# Patient Record
Sex: Female | Born: 1950 | Race: White | Hispanic: No | Marital: Married
Health system: Southern US, Academic
[De-identification: ages and names within clinical notes are randomized; demographics above are authoritative.]

## PROBLEM LIST (undated history)

## (undated) ENCOUNTER — Encounter: Attending: Family | Primary: Family

## (undated) ENCOUNTER — Encounter

## (undated) ENCOUNTER — Ambulatory Visit

## (undated) ENCOUNTER — Ambulatory Visit: Payer: MEDICARE

## (undated) ENCOUNTER — Encounter: Attending: Hematology & Oncology | Primary: Hematology & Oncology

## (undated) ENCOUNTER — Encounter: Attending: Pharmacist | Primary: Pharmacist

## (undated) ENCOUNTER — Ambulatory Visit: Payer: MEDICARE | Attending: Adult Health | Primary: Adult Health

## (undated) ENCOUNTER — Telehealth: Attending: Hematology & Oncology | Primary: Hematology & Oncology

## (undated) ENCOUNTER — Encounter: Attending: Adult Health | Primary: Adult Health

## (undated) ENCOUNTER — Ambulatory Visit: Payer: MEDICARE | Attending: Hematology & Oncology | Primary: Hematology & Oncology

## (undated) ENCOUNTER — Telehealth

## (undated) ENCOUNTER — Telehealth: Attending: Surgical | Primary: Surgical

## (undated) ENCOUNTER — Telehealth: Attending: Pharmacist | Primary: Pharmacist

## (undated) ENCOUNTER — Telehealth: Attending: Medical Oncology | Primary: Medical Oncology

## (undated) ENCOUNTER — Telehealth: Attending: Adult Health | Primary: Adult Health

## (undated) ENCOUNTER — Encounter: Attending: Surgical | Primary: Surgical

## (undated) ENCOUNTER — Encounter: Attending: Nurse Practitioner | Primary: Nurse Practitioner

## (undated) DIAGNOSIS — R739 Hyperglycemia, unspecified: Secondary | ICD-10-CM

## (undated) DIAGNOSIS — M1990 Unspecified osteoarthritis, unspecified site: Secondary | ICD-10-CM

## (undated) DIAGNOSIS — C50919 Malignant neoplasm of unspecified site of unspecified female breast: Secondary | ICD-10-CM

## (undated) DIAGNOSIS — Z85828 Personal history of other malignant neoplasm of skin: Secondary | ICD-10-CM

## (undated) DIAGNOSIS — K219 Gastro-esophageal reflux disease without esophagitis: Secondary | ICD-10-CM

## (undated) DIAGNOSIS — R112 Nausea with vomiting, unspecified: Secondary | ICD-10-CM

## (undated) DIAGNOSIS — C801 Malignant (primary) neoplasm, unspecified: Secondary | ICD-10-CM

## (undated) DIAGNOSIS — I82409 Acute embolism and thrombosis of unspecified deep veins of unspecified lower extremity: Secondary | ICD-10-CM

## (undated) DIAGNOSIS — M810 Age-related osteoporosis without current pathological fracture: Secondary | ICD-10-CM

## (undated) DIAGNOSIS — G629 Polyneuropathy, unspecified: Secondary | ICD-10-CM

## (undated) DIAGNOSIS — K5790 Diverticulosis of intestine, part unspecified, without perforation or abscess without bleeding: Secondary | ICD-10-CM

## (undated) DIAGNOSIS — E039 Hypothyroidism, unspecified: Secondary | ICD-10-CM

## (undated) DIAGNOSIS — Z9889 Other specified postprocedural states: Secondary | ICD-10-CM

## (undated) HISTORY — DX: Malignant neoplasm of unspecified site of unspecified female breast: C50.919

## (undated) HISTORY — PX: BREAST SURGERY: SHX581

## (undated) HISTORY — PX: KNEE ARTHROPLASTY: SHX992

## (undated) HISTORY — PX: MASTECTOMY: SHX3

## (undated) HISTORY — PX: TONSILLECTOMY: SUR1361

## (undated) HISTORY — DX: Hyperglycemia, unspecified: R73.9

## (undated) HISTORY — PX: DILATION AND CURETTAGE OF UTERUS: SHX78

## (undated) MED ORDER — LACTOBACILLUS ACIDOPHILUS 10 BILLION CELL CAPSULE: 0 days

## (undated) MED ORDER — FLUTICASONE PROPIONATE 50 MCG/ACTUATION NASAL SPRAY,SUSPENSION: 0 days

## (undated) MED ORDER — LEVOFLOXACIN 500 MG TABLET: Freq: Once | 0.00000 days | PRN

## (undated) MED ORDER — BD LUER-LOK SYRINGE 3 ML 25 GAUGE X 1": 0 days

## (undated) MED ORDER — AMOXICILLIN 875 MG TABLET: Freq: Every day | 0.00000 days | PRN

## (undated) MED ORDER — CYANOCOBALAMIN (VIT B-12) 1,000 MCG/ML INJECTION SOLUTION: Freq: Every day | 0 days

---
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
## 1972-04-14 DIAGNOSIS — I82409 Acute embolism and thrombosis of unspecified deep veins of unspecified lower extremity: Secondary | ICD-10-CM

## 1972-04-14 HISTORY — DX: Acute embolism and thrombosis of unspecified deep veins of unspecified lower extremity: I82.409

## 1989-04-14 HISTORY — PX: MODIFIED RADICAL MASTECTOMY W/ AXILLARY LYMPH NODE DISSECTION: SHX2042

## 1997-04-14 HISTORY — PX: RECONSTRUCTION BREAST W/ TRAM FLAP: SUR1079

## 1997-04-14 HISTORY — PX: MODIFIED RADICAL MASTECTOMY W/ AXILLARY LYMPH NODE DISSECTION: SHX2042

## 1997-08-04 ENCOUNTER — Other Ambulatory Visit: Admission: RE | Admit: 1997-08-04 | Discharge: 1997-08-04 | Payer: Self-pay | Admitting: Obstetrics and Gynecology

## 1998-01-09 ENCOUNTER — Other Ambulatory Visit: Admission: RE | Admit: 1998-01-09 | Discharge: 1998-01-09 | Payer: Self-pay | Admitting: Radiology

## 1998-02-01 ENCOUNTER — Encounter: Payer: Self-pay | Admitting: Internal Medicine

## 1998-02-01 ENCOUNTER — Ambulatory Visit (HOSPITAL_COMMUNITY): Admission: RE | Admit: 1998-02-01 | Discharge: 1998-02-01 | Payer: Self-pay | Admitting: Internal Medicine

## 1998-02-02 ENCOUNTER — Ambulatory Visit (HOSPITAL_COMMUNITY): Admission: RE | Admit: 1998-02-02 | Discharge: 1998-02-02 | Payer: Self-pay | Admitting: General Surgery

## 1998-02-05 ENCOUNTER — Encounter: Payer: Self-pay | Admitting: General Surgery

## 1998-02-07 ENCOUNTER — Encounter: Payer: Self-pay | Admitting: General Surgery

## 1998-02-07 ENCOUNTER — Inpatient Hospital Stay (HOSPITAL_COMMUNITY): Admission: RE | Admit: 1998-02-07 | Discharge: 1998-02-10 | Payer: Self-pay | Admitting: Plastic Surgery

## 1998-02-10 ENCOUNTER — Encounter: Payer: Self-pay | Admitting: Plastic Surgery

## 1998-02-28 ENCOUNTER — Ambulatory Visit (HOSPITAL_COMMUNITY): Admission: RE | Admit: 1998-02-28 | Discharge: 1998-02-28 | Payer: Self-pay | Admitting: Oncology

## 1998-02-28 ENCOUNTER — Encounter: Payer: Self-pay | Admitting: Oncology

## 1998-03-01 ENCOUNTER — Ambulatory Visit (HOSPITAL_COMMUNITY): Admission: RE | Admit: 1998-03-01 | Discharge: 1998-03-01 | Payer: Self-pay | Admitting: Oncology

## 1998-03-02 ENCOUNTER — Ambulatory Visit (HOSPITAL_COMMUNITY): Admission: RE | Admit: 1998-03-02 | Discharge: 1998-03-02 | Payer: Self-pay | Admitting: General Surgery

## 1998-03-02 ENCOUNTER — Ambulatory Visit: Admission: RE | Admit: 1998-03-02 | Discharge: 1998-03-02 | Payer: Self-pay | Admitting: Oncology

## 1998-03-02 ENCOUNTER — Encounter: Payer: Self-pay | Admitting: General Surgery

## 1998-03-13 ENCOUNTER — Encounter: Payer: Self-pay | Admitting: Oncology

## 1998-03-13 ENCOUNTER — Ambulatory Visit (HOSPITAL_COMMUNITY): Admission: RE | Admit: 1998-03-13 | Discharge: 1998-03-13 | Payer: Self-pay | Admitting: Oncology

## 1998-05-22 ENCOUNTER — Other Ambulatory Visit: Admission: RE | Admit: 1998-05-22 | Discharge: 1998-05-22 | Payer: Self-pay | Admitting: Obstetrics and Gynecology

## 1998-10-10 ENCOUNTER — Encounter: Admission: RE | Admit: 1998-10-10 | Discharge: 1999-01-08 | Payer: Self-pay | Admitting: Radiation Oncology

## 1999-02-04 ENCOUNTER — Encounter: Payer: Self-pay | Admitting: Oncology

## 1999-02-04 ENCOUNTER — Ambulatory Visit (HOSPITAL_COMMUNITY): Admission: RE | Admit: 1999-02-04 | Discharge: 1999-02-04 | Payer: Self-pay | Admitting: Oncology

## 1999-02-28 ENCOUNTER — Ambulatory Visit (HOSPITAL_COMMUNITY): Admission: RE | Admit: 1999-02-28 | Discharge: 1999-02-28 | Payer: Self-pay | Admitting: General Surgery

## 1999-08-07 ENCOUNTER — Encounter: Payer: Self-pay | Admitting: Oncology

## 1999-08-07 ENCOUNTER — Ambulatory Visit (HOSPITAL_COMMUNITY): Admission: RE | Admit: 1999-08-07 | Discharge: 1999-08-07 | Payer: Self-pay | Admitting: Oncology

## 1999-09-02 ENCOUNTER — Other Ambulatory Visit: Admission: RE | Admit: 1999-09-02 | Discharge: 1999-09-02 | Payer: Self-pay | Admitting: Obstetrics and Gynecology

## 1999-09-03 ENCOUNTER — Encounter (INDEPENDENT_AMBULATORY_CARE_PROVIDER_SITE_OTHER): Payer: Self-pay | Admitting: Specialist

## 1999-09-03 ENCOUNTER — Other Ambulatory Visit: Admission: RE | Admit: 1999-09-03 | Discharge: 1999-09-03 | Payer: Self-pay | Admitting: Obstetrics and Gynecology

## 2000-02-10 ENCOUNTER — Ambulatory Visit (HOSPITAL_COMMUNITY): Admission: RE | Admit: 2000-02-10 | Discharge: 2000-02-10 | Payer: Self-pay | Admitting: Internal Medicine

## 2000-02-10 ENCOUNTER — Encounter: Payer: Self-pay | Admitting: Internal Medicine

## 2000-03-31 ENCOUNTER — Other Ambulatory Visit: Admission: RE | Admit: 2000-03-31 | Discharge: 2000-03-31 | Payer: Self-pay | Admitting: Obstetrics and Gynecology

## 2000-03-31 ENCOUNTER — Encounter (INDEPENDENT_AMBULATORY_CARE_PROVIDER_SITE_OTHER): Payer: Self-pay | Admitting: Specialist

## 2000-07-27 ENCOUNTER — Ambulatory Visit (HOSPITAL_COMMUNITY): Admission: RE | Admit: 2000-07-27 | Discharge: 2000-07-27 | Payer: Self-pay | Admitting: Oncology

## 2000-07-27 ENCOUNTER — Encounter: Payer: Self-pay | Admitting: Oncology

## 2000-07-31 ENCOUNTER — Encounter: Payer: Self-pay | Admitting: Oncology

## 2000-07-31 ENCOUNTER — Ambulatory Visit (HOSPITAL_COMMUNITY): Admission: RE | Admit: 2000-07-31 | Discharge: 2000-07-31 | Payer: Self-pay | Admitting: Oncology

## 2000-09-25 ENCOUNTER — Other Ambulatory Visit: Admission: RE | Admit: 2000-09-25 | Discharge: 2000-09-25 | Payer: Self-pay | Admitting: Obstetrics and Gynecology

## 2001-07-26 ENCOUNTER — Ambulatory Visit (HOSPITAL_COMMUNITY): Admission: RE | Admit: 2001-07-26 | Discharge: 2001-07-26 | Payer: Self-pay | Admitting: Oncology

## 2001-07-26 ENCOUNTER — Encounter: Payer: Self-pay | Admitting: Oncology

## 2002-03-09 ENCOUNTER — Other Ambulatory Visit: Admission: RE | Admit: 2002-03-09 | Discharge: 2002-03-09 | Payer: Self-pay | Admitting: Obstetrics and Gynecology

## 2002-06-01 ENCOUNTER — Ambulatory Visit (HOSPITAL_COMMUNITY): Admission: RE | Admit: 2002-06-01 | Discharge: 2002-06-01 | Payer: Self-pay | Admitting: Internal Medicine

## 2002-06-01 ENCOUNTER — Encounter: Payer: Self-pay | Admitting: Internal Medicine

## 2002-08-17 ENCOUNTER — Encounter: Payer: Self-pay | Admitting: Internal Medicine

## 2003-03-07 ENCOUNTER — Other Ambulatory Visit: Admission: RE | Admit: 2003-03-07 | Discharge: 2003-03-07 | Payer: Self-pay | Admitting: Obstetrics and Gynecology

## 2003-04-01 ENCOUNTER — Encounter: Payer: Self-pay | Admitting: Internal Medicine

## 2003-05-12 ENCOUNTER — Ambulatory Visit (HOSPITAL_COMMUNITY): Admission: RE | Admit: 2003-05-12 | Discharge: 2003-05-12 | Payer: Self-pay | Admitting: Oncology

## 2003-08-04 ENCOUNTER — Encounter (INDEPENDENT_AMBULATORY_CARE_PROVIDER_SITE_OTHER): Payer: Self-pay | Admitting: Specialist

## 2003-08-04 ENCOUNTER — Ambulatory Visit (HOSPITAL_COMMUNITY): Admission: RE | Admit: 2003-08-04 | Discharge: 2003-08-04 | Payer: Self-pay | Admitting: Obstetrics and Gynecology

## 2003-08-04 HISTORY — PX: HYSTEROSCOPY WITH D & C: SHX1775

## 2004-07-11 ENCOUNTER — Ambulatory Visit: Payer: Self-pay | Admitting: Oncology

## 2004-09-13 ENCOUNTER — Ambulatory Visit (HOSPITAL_COMMUNITY): Admission: RE | Admit: 2004-09-13 | Discharge: 2004-09-13 | Payer: Self-pay | Admitting: Oncology

## 2004-11-14 ENCOUNTER — Ambulatory Visit: Payer: Self-pay | Admitting: Oncology

## 2005-01-24 ENCOUNTER — Ambulatory Visit: Payer: Self-pay | Admitting: Internal Medicine

## 2005-01-24 ENCOUNTER — Encounter: Admission: RE | Admit: 2005-01-24 | Discharge: 2005-01-24 | Payer: Self-pay | Admitting: Internal Medicine

## 2005-01-29 ENCOUNTER — Ambulatory Visit: Payer: Self-pay | Admitting: Internal Medicine

## 2005-02-18 ENCOUNTER — Ambulatory Visit: Payer: Self-pay | Admitting: Internal Medicine

## 2005-02-18 ENCOUNTER — Encounter (INDEPENDENT_AMBULATORY_CARE_PROVIDER_SITE_OTHER): Payer: Self-pay | Admitting: *Deleted

## 2005-02-20 ENCOUNTER — Ambulatory Visit (HOSPITAL_COMMUNITY): Admission: RE | Admit: 2005-02-20 | Discharge: 2005-02-20 | Payer: Self-pay | Admitting: Internal Medicine

## 2005-02-20 ENCOUNTER — Encounter: Payer: Self-pay | Admitting: Internal Medicine

## 2005-03-03 ENCOUNTER — Other Ambulatory Visit: Admission: RE | Admit: 2005-03-03 | Discharge: 2005-03-03 | Payer: Self-pay | Admitting: Obstetrics and Gynecology

## 2005-03-03 ENCOUNTER — Ambulatory Visit (HOSPITAL_COMMUNITY): Admission: RE | Admit: 2005-03-03 | Discharge: 2005-03-03 | Payer: Self-pay | Admitting: Obstetrics and Gynecology

## 2005-03-24 ENCOUNTER — Ambulatory Visit (HOSPITAL_COMMUNITY): Admission: RE | Admit: 2005-03-24 | Discharge: 2005-03-24 | Payer: Self-pay | Admitting: Oncology

## 2005-05-15 ENCOUNTER — Ambulatory Visit: Payer: Self-pay | Admitting: Oncology

## 2005-08-13 ENCOUNTER — Encounter: Payer: Self-pay | Admitting: General Surgery

## 2006-05-12 ENCOUNTER — Ambulatory Visit: Payer: Self-pay | Admitting: Oncology

## 2006-05-28 LAB — CBC WITH DIFFERENTIAL/PLATELET
Basophils Absolute: 0 10*3/uL (ref 0.0–0.1)
HCT: 39.3 % (ref 34.8–46.6)
HGB: 14 g/dL (ref 11.6–15.9)
MCH: 30.5 pg (ref 26.0–34.0)
MONO#: 0.6 10*3/uL (ref 0.1–0.9)
MONO%: 11.5 % (ref 0.0–13.0)
NEUT%: 59.4 % (ref 39.6–76.8)
RDW: 13 % (ref 11.3–14.5)
WBC: 4.9 10*3/uL (ref 3.9–10.0)
lymph#: 1.3 10*3/uL (ref 0.9–3.3)

## 2006-05-28 LAB — COMPREHENSIVE METABOLIC PANEL
Alkaline Phosphatase: 61 U/L (ref 39–117)
Glucose, Bld: 98 mg/dL (ref 70–99)
Sodium: 138 mEq/L (ref 135–145)
Total Bilirubin: 1.1 mg/dL (ref 0.3–1.2)
Total Protein: 7.3 g/dL (ref 6.0–8.3)

## 2006-07-30 ENCOUNTER — Other Ambulatory Visit: Admission: RE | Admit: 2006-07-30 | Discharge: 2006-07-30 | Payer: Self-pay | Admitting: Obstetrics and Gynecology

## 2007-03-05 ENCOUNTER — Encounter: Admission: RE | Admit: 2007-03-05 | Discharge: 2007-03-05 | Payer: Self-pay | Admitting: Oncology

## 2007-03-19 ENCOUNTER — Ambulatory Visit: Payer: Self-pay | Admitting: Internal Medicine

## 2007-03-19 LAB — CONVERTED CEMR LAB
BUN: 12 mg/dL (ref 6–23)
Creatinine, Ser: 0.7 mg/dL (ref 0.4–1.2)

## 2007-03-22 ENCOUNTER — Ambulatory Visit: Payer: Self-pay | Admitting: Internal Medicine

## 2007-05-26 ENCOUNTER — Ambulatory Visit: Payer: Self-pay | Admitting: Oncology

## 2007-06-11 LAB — CBC WITH DIFFERENTIAL/PLATELET
Basophils Absolute: 0 10*3/uL (ref 0.0–0.1)
EOS%: 0.8 % (ref 0.0–7.0)
HGB: 13.3 g/dL (ref 11.6–15.9)
LYMPH%: 28.8 % (ref 14.0–48.0)
MCH: 30.1 pg (ref 26.0–34.0)
MCV: 84.6 fL (ref 81.0–101.0)
MONO%: 10.7 % (ref 0.0–13.0)
Platelets: 252 10*3/uL (ref 145–400)
RBC: 4.44 10*6/uL (ref 3.70–5.32)
RDW: 13.4 % (ref 11.3–14.5)

## 2007-06-11 LAB — COMPREHENSIVE METABOLIC PANEL
AST: 21 U/L (ref 0–37)
Albumin: 4.2 g/dL (ref 3.5–5.2)
Alkaline Phosphatase: 51 U/L (ref 39–117)
BUN: 12 mg/dL (ref 6–23)
Glucose, Bld: 79 mg/dL (ref 70–99)
Potassium: 4.4 mEq/L (ref 3.5–5.3)
Total Bilirubin: 0.9 mg/dL (ref 0.3–1.2)

## 2008-08-23 ENCOUNTER — Ambulatory Visit: Payer: Self-pay | Admitting: Oncology

## 2008-08-25 ENCOUNTER — Encounter: Payer: Self-pay | Admitting: Internal Medicine

## 2008-08-25 LAB — COMPREHENSIVE METABOLIC PANEL
ALT: 17 U/L (ref 0–35)
AST: 24 U/L (ref 0–37)
Albumin: 4 g/dL (ref 3.5–5.2)
Alkaline Phosphatase: 49 U/L (ref 39–117)
Calcium: 8.9 mg/dL (ref 8.4–10.5)
Chloride: 103 mEq/L (ref 96–112)
Potassium: 3.9 mEq/L (ref 3.5–5.3)

## 2008-08-25 LAB — MORPHOLOGY: PLT EST: ADEQUATE

## 2008-08-25 LAB — CBC WITH DIFFERENTIAL/PLATELET
Eosinophils Absolute: 0 10*3/uL (ref 0.0–0.5)
LYMPH%: 26.5 % (ref 14.0–49.7)
MONO#: 0.5 10*3/uL (ref 0.1–0.9)
NEUT#: 2.8 10*3/uL (ref 1.5–6.5)
Platelets: 232 10*3/uL (ref 145–400)
RBC: 4.53 10*6/uL (ref 3.70–5.45)
RDW: 12.9 % (ref 11.2–14.5)
WBC: 4.4 10*3/uL (ref 3.9–10.3)
lymph#: 1.2 10*3/uL (ref 0.9–3.3)

## 2009-03-05 ENCOUNTER — Ambulatory Visit: Payer: Self-pay | Admitting: Internal Medicine

## 2009-03-05 DIAGNOSIS — K649 Unspecified hemorrhoids: Secondary | ICD-10-CM | POA: Insufficient documentation

## 2009-03-05 DIAGNOSIS — Z853 Personal history of malignant neoplasm of breast: Secondary | ICD-10-CM | POA: Insufficient documentation

## 2009-03-05 DIAGNOSIS — R10816 Epigastric abdominal tenderness: Secondary | ICD-10-CM | POA: Insufficient documentation

## 2009-03-05 DIAGNOSIS — Z860100 Personal history of colon polyps, unspecified: Secondary | ICD-10-CM | POA: Insufficient documentation

## 2009-03-05 DIAGNOSIS — E039 Hypothyroidism, unspecified: Secondary | ICD-10-CM | POA: Insufficient documentation

## 2009-03-05 DIAGNOSIS — K5730 Diverticulosis of large intestine without perforation or abscess without bleeding: Secondary | ICD-10-CM | POA: Insufficient documentation

## 2009-03-05 DIAGNOSIS — K589 Irritable bowel syndrome without diarrhea: Secondary | ICD-10-CM | POA: Insufficient documentation

## 2009-03-05 DIAGNOSIS — I82409 Acute embolism and thrombosis of unspecified deep veins of unspecified lower extremity: Secondary | ICD-10-CM | POA: Insufficient documentation

## 2009-03-06 ENCOUNTER — Ambulatory Visit: Payer: Self-pay | Admitting: Internal Medicine

## 2009-03-07 ENCOUNTER — Ambulatory Visit (HOSPITAL_COMMUNITY): Admission: RE | Admit: 2009-03-07 | Discharge: 2009-03-07 | Payer: Self-pay | Admitting: Internal Medicine

## 2009-03-07 LAB — CONVERTED CEMR LAB
ALT: 16 units/L (ref 0–35)
AST: 17 units/L (ref 0–37)
Albumin: 4 g/dL (ref 3.5–5.2)
Alkaline Phosphatase: 41 units/L (ref 39–117)
Amylase: 88 units/L (ref 27–131)
BUN: 8 mg/dL (ref 6–23)
Basophils Absolute: 0 10*3/uL (ref 0.0–0.1)
Basophils Relative: 1.1 % (ref 0.0–3.0)
CO2: 31 meq/L (ref 19–32)
Calcium: 9.1 mg/dL (ref 8.4–10.5)
Chloride: 106 meq/L (ref 96–112)
Creatinine, Ser: 0.8 mg/dL (ref 0.4–1.2)
Eosinophils Absolute: 0.1 10*3/uL (ref 0.0–0.7)
Eosinophils Relative: 1.7 % (ref 0.0–5.0)
GFR calc non Af Amer: 77.64 mL/min (ref >60)
Glucose, Bld: 97 mg/dL (ref 70–99)
HCT: 41.4 % (ref 36.0–46.0)
Hemoglobin: 14.1 g/dL (ref 12.0–15.0)
Lipase: 10 units/L — ABNORMAL LOW (ref 11.0–59.0)
Lymphocytes Relative: 23.3 % (ref 12.0–46.0)
Lymphs Abs: 1 10*3/uL (ref 0.7–4.0)
MCHC: 34.1 g/dL (ref 30.0–36.0)
MCV: 91.1 fL (ref 78.0–100.0)
Monocytes Absolute: 0.4 10*3/uL (ref 0.1–1.0)
Monocytes Relative: 10.3 % (ref 3.0–12.0)
Neutro Abs: 2.7 10*3/uL (ref 1.4–7.7)
Neutrophils Relative %: 63.6 % (ref 43.0–77.0)
Platelets: 226 10*3/uL (ref 150.0–400.0)
Potassium: 4.1 meq/L (ref 3.5–5.1)
RBC: 4.55 M/uL (ref 3.87–5.11)
RDW: 12.4 % (ref 11.5–14.6)
Sodium: 142 meq/L (ref 135–145)
Total Bilirubin: 0.9 mg/dL (ref 0.3–1.2)
Total Protein: 6.8 g/dL (ref 6.0–8.3)
WBC: 4.2 10*3/uL — ABNORMAL LOW (ref 4.5–10.5)

## 2009-08-30 ENCOUNTER — Ambulatory Visit: Payer: Self-pay | Admitting: Oncology

## 2009-08-31 ENCOUNTER — Encounter: Payer: Self-pay | Admitting: Internal Medicine

## 2009-08-31 LAB — CBC WITH DIFFERENTIAL/PLATELET
BASO%: 0.4 % (ref 0.0–2.0)
Basophils Absolute: 0 10*3/uL (ref 0.0–0.1)
EOS%: 1.2 % (ref 0.0–7.0)
Eosinophils Absolute: 0.1 10*3/uL (ref 0.0–0.5)
HCT: 41.5 % (ref 34.8–46.6)
HGB: 14.7 g/dL (ref 11.6–15.9)
LYMPH%: 21.5 % (ref 14.0–49.7)
MCH: 30.6 pg (ref 25.1–34.0)
MCHC: 35.4 g/dL (ref 31.5–36.0)
MCV: 86.4 fL (ref 79.5–101.0)
MONO#: 0.6 10*3/uL (ref 0.1–0.9)
MONO%: 10.4 % (ref 0.0–14.0)
NEUT#: 3.7 10*3/uL (ref 1.5–6.5)
NEUT%: 66.5 % (ref 38.4–76.8)
Platelets: 252 10*3/uL (ref 145–400)
RBC: 4.8 10*6/uL (ref 3.70–5.45)
RDW: 12.7 % (ref 11.2–14.5)
WBC: 5.6 10*3/uL (ref 3.9–10.3)
lymph#: 1.2 10*3/uL (ref 0.9–3.3)

## 2009-08-31 LAB — TSH: TSH: 0.698 u[IU]/mL (ref 0.350–4.500)

## 2009-08-31 LAB — COMPREHENSIVE METABOLIC PANEL
ALT: 17 U/L (ref 0–35)
AST: 19 U/L (ref 0–37)
Albumin: 4.2 g/dL (ref 3.5–5.2)
Alkaline Phosphatase: 46 U/L (ref 39–117)
BUN: 12 mg/dL (ref 6–23)
CO2: 29 mEq/L (ref 19–32)
Calcium: 9.2 mg/dL (ref 8.4–10.5)
Chloride: 101 mEq/L (ref 96–112)
Creatinine, Ser: 0.84 mg/dL (ref 0.40–1.20)
Glucose, Bld: 100 mg/dL — ABNORMAL HIGH (ref 70–99)
Potassium: 4.1 mEq/L (ref 3.5–5.3)
Sodium: 137 mEq/L (ref 135–145)
Total Bilirubin: 1.4 mg/dL — ABNORMAL HIGH (ref 0.3–1.2)
Total Protein: 7.5 g/dL (ref 6.0–8.3)

## 2009-08-31 LAB — LACTATE DEHYDROGENASE: LDH: 152 U/L (ref 94–250)

## 2009-12-18 ENCOUNTER — Encounter (INDEPENDENT_AMBULATORY_CARE_PROVIDER_SITE_OTHER): Payer: Self-pay | Admitting: *Deleted

## 2010-01-01 ENCOUNTER — Encounter (INDEPENDENT_AMBULATORY_CARE_PROVIDER_SITE_OTHER): Payer: Self-pay | Admitting: *Deleted

## 2010-01-02 ENCOUNTER — Ambulatory Visit: Payer: Self-pay | Admitting: Internal Medicine

## 2010-01-09 ENCOUNTER — Ambulatory Visit: Payer: Self-pay | Admitting: Internal Medicine

## 2010-04-05 ENCOUNTER — Telehealth: Payer: Self-pay | Admitting: Internal Medicine

## 2010-05-04 ENCOUNTER — Encounter: Payer: Self-pay | Admitting: Oncology

## 2010-05-14 NOTE — Letter (Signed)
[facility]   Imported By: Sherian Rein 09/20/2009 14:10:05  _____________________________________________________________________  External Attachment:    Type:   Image     Comment:   External Document

## 2010-05-14 NOTE — Letter (Signed)
Summary: Pre Visit Letter Revised  [facility]  [address] [place], [place] [address]   Phone: [phone]  Fax: [phone]        12/18/2009 MRN: [id number] Samantha Archer [address] [place], [place]  [address]             Procedure Date:  01/09/2010   Welcome to the Gastroenterology Division at [facility].    You are scheduled to see a nurse for your pre-procedure visit on 01/02/2010 at 2:30PM on the 3rd floor at [facility], [address]  We ask that you try to arrive at our office 15 minutes prior to your appointment time to allow for check-in.  Please take a minute to review the attached form.  If you answer "Yes" to one or more of the questions on the first page, we ask that you call the person listed at your earliest opportunity.  If you answer "No" to all of the questions, please complete the rest of the form and bring it to your appointment.    Your nurse visit will consist of discussing your medical and surgical history, your immediate family medical history, and your medications.   If you are unable to list all of your medications on the form, please bring the medication bottles to your appointment and we will list them.  We will need to be aware of both prescribed and over the counter drugs.  We will need to know exact dosage information as well.    Please be prepared to read and sign documents such as consent forms, a financial agreement, and acknowledgement forms.  If necessary, and with your consent, a friend or relative is welcome to sit-in on the nurse visit with you.  Please bring your insurance card so that we may make a copy of it.  If your insurance requires a referral to see a specialist, please bring your referral form from your primary care physician.  No co-pay is required for this nurse visit.     If you cannot keep your appointment, please call [phone] to cancel or reschedule prior to your appointment date.  This  allows [place] the opportunity to schedule an appointment for another patient in need of care.    Thank you for choosing [facility] for your medical needs.  We appreciate the opportunity to care for you.  Please visit [place] at our website  to learn more about our practice.  Sincerely, The Gastroenterology Division

## 2010-05-14 NOTE — Miscellaneous (Signed)
Summary: previsit prep/rm  Clinical Lists Changes  Medications: Added new medication of MOVIPREP 100 GM  SOLR (PEG-KCL-NACL-NASULF-NA ASC-C) As per prep instructions. - Signed Rx of MOVIPREP 100 GM  SOLR (PEG-KCL-NACL-NASULF-NA ASC-C) As per prep instructions.;  #1 x 0;  Signed;  Entered by: Sherren Kerns RN;  Authorized by: Hart Carwin MD;  Method [facility] [address] [phone]*, [address], [place], [place], [place]  [address], Ph: [phone] or [phone], Fax: [phone] Observations: Added new observation of ALLERGY REV: Done (01/02/2010 14:48)    Prescriptions: MOVIPREP 100 GM  SOLR (PEG-KCL-NACL-NASULF-NA ASC-C) As per prep instructions.  #1 x 0   Entered by:   Sherren Kerns RN   Authorized by:   Hart Carwin MD   Signed by:   Sherren Kerns RN on 01/02/2010   Method used:   [facility] to        [facility] [address] [phone]* (retail)       [address]       [place], [place]  [address]       Ph: [phone] or [phone]       Fax: [phone]   RxID:   [id number]

## 2010-05-14 NOTE — Procedures (Signed)
Summary: Colonoscopy  Patient: [name] Note: All result statuses are Final unless otherwise noted.  Tests: (1) Colonoscopy (COL)   COL Colonoscopy           DONE     [facility]     [address]     [place], [place]  [address]           COLONOSCOPY PROCEDURE REPORT           PATIENT:  Samantha Archer, Samantha Archer  MR#:  [id number]     BIRTHDATE:  [birth year], 59 yrs. old  GENDER:  female     ENDOSCOPIST:  Hedwig Morton. Juanda Chance, MD     REF. BY:  Robert Bellow, M.D.     PROCEDURE DATE:  01/09/2010     PROCEDURE:  Colonoscopy 25427     ASA CLASS:  Class II     INDICATIONS:  Elevated Risk Screening adenom polyp 0623,7628, no     polyp 2004     hx of breast ca x2,     mother gastric lymphoma     MEDICATIONS:   Versed 7 mg, Fentanyl 75 mcg           DESCRIPTION OF PROCEDURE:   After the risks benefits and     alternatives of the procedure were thoroughly explained, informed     consent was obtained.  Digital rectal exam was performed and     revealed no rectal masses.   The LB CF-[id number] endoscope     was introduced through the anus and advanced to the cecum, which     was identified by both the appendix and ileocecal valve, without     limitations.  The quality of the prep was excellent, using     MoviPrep.  The instrument was then slowly withdrawn as the colon     was fully examined.     <<PROCEDUREIMAGES>>           FINDINGS:  No polyps or cancers were seen (see image1, image2,     image3, image4, image5, and image6).   Retroflexed views in the     rectum revealed no abnormalities.    The scope was then withdrawn     from the patient and the procedure completed.           COMPLICATIONS:  None     ENDOSCOPIC IMPRESSION:     1) No polyps or cancers     2) Normal colonoscopy     no recurrent polyps     RECOMMENDATIONS:     1) high fiber diet     REPEAT EXAM:  In 7 - 10 year(s) for.           ______________________________     Hedwig Morton. Juanda Chance, MD           CC:             n.     eSIGNED:   Hedwig Morton. Demaryius Imran at 01/09/2010 02:45 PM           Joee, [place], [id number]  Note: An exclamation mark (!) indicates a result that was not dispersed into the flowsheet. Document Creation Date: 01/09/2010 2:45 PM _______________________________________________________________________  (1) Order result status: Final Collection or observation date-time: 01/09/2010 14:33 Requested date-time:  Receipt date-time:  Reported date-time:  Referring Physician:   Ordering Physician: Lina Sar [phone]) Specimen Source:  Source: Launa Grill Order Number: [id number] Lab site:   Appended Document: Colonoscopy  Clinical Lists Changes  Observations: Added new observation of COLONNXTDUE: 12/2016 (01/09/2010 15:47)

## 2010-05-14 NOTE — Letter (Signed)
Summary: Moviprep Instructions  [facility]  [address]   [place], [place] [address]   Phone: [phone]  Fax: [phone]       Samantha Archer    Feb 12, 1951    MRN: [id number]        Procedure Day /Date: Wednesday, 01-09-10     Arrival Time: 1:00 p.m.     Procedure Time: 2:00 p.m.     Location of Procedure:                    x   [facility] (4th Floor)  PREPARATION FOR COLONOSCOPY WITH MOVIPREP   Starting 5 days prior to your procedure 01-04-10  do not eat nuts, seeds, popcorn, corn, beans, peas,  salads, or any raw vegetables.  Do not take any fiber supplements (e.g. Metamucil, Citrucel, and Benefiber).  THE DAY BEFORE YOUR PROCEDURE         DATE:  01-08-10   DAY: Tuesday  1.  Drink clear liquids the entire day-NO SOLID FOOD  2.  Do not drink anything colored red or purple.  Avoid juices with pulp.  No orange juice.  3.  Drink at least 64 oz. (8 glasses) of fluid/clear liquids during the day to prevent dehydration and help the prep work efficiently.  CLEAR LIQUIDS INCLUDE: Water Jello Ice Popsicles Tea (sugar ok, no milk/cream) Powdered fruit flavored drinks Coffee (sugar ok, no milk/cream) Gatorade Juice: apple, white grape, white cranberry  Lemonade Clear bullion, consomm, broth Carbonated beverages (any kind) Strained chicken noodle soup Hard Candy                             4.  In the morning, mix first dose of MoviPrep solution:    Empty 1 Pouch A and 1 Pouch B into the disposable container    Add lukewarm drinking water to the top line of the container. Mix to dissolve    Refrigerate (mixed solution should be used within 24 hrs)  5.  Begin drinking the prep at 5:00 p.m. The MoviPrep container is divided by 4 marks.   Every 15 minutes drink the solution down to the next mark (approximately 8 oz) until the full liter is complete.   6.  Follow completed prep with 16 oz of clear liquid of your choice (Nothing red or  purple).  Continue to drink clear liquids until bedtime.  7.  Before going to bed, mix second dose of MoviPrep solution:    Empty 1 Pouch A and 1 Pouch B into the disposable container    Add lukewarm drinking water to the top line of the container. Mix to dissolve    Refrigerate  THE DAY OF YOUR PROCEDURE      DATE: 01-09-10  DAY: Wednesday  Beginning at 9:00 a.m. (5 hours before procedure):         1. Every 15 minutes, drink the solution down to the next mark (approx 8 oz) until the full liter is complete.  2. Follow completed prep with 16 oz. of clear liquid of your choice.    3. You may drink clear liquids until 12:00 p.m.  (2 HOURS BEFORE PROCEDURE).   MEDICATION INSTRUCTIONS  Unless otherwise instructed, you should take regular prescription medications with a small sip of water   as early as possible the morning of your procedure.    Additional medication instructions: n/a  OTHER INSTRUCTIONS  You will need a responsible adult at least 62 years of age to accompany you and drive you home.   This person must remain in the waiting room during your procedure.  Wear loose fitting clothing that is easily removed.  Leave jewelry and other valuables at home.  However, you may wish to bring a book to read or  an iPod/MP3 player to listen to music as you wait for your procedure to start.  Remove all body piercing jewelry and leave at home.  Total time from sign-in until discharge is approximately 2-3 hours.  You should go home directly after your procedure and rest.  You can resume normal activities the  day after your procedure.  The day of your procedure you should not:   Drive   Make legal decisions   Operate machinery   Drink alcohol   Return to work  You will receive specific instructions about eating, activities and medications before you leave.    The above instructions have been reviewed and explained to me by  Sherren Kerns RN  January 02, 2010 3:13 PM  I fully understand and can verbalize these instructions _____________________________ Date _________

## 2010-05-16 NOTE — Progress Notes (Signed)
Summary: Needs Nexium refill today  Phone Note Call from Patient Call back at Home Phone [phone]   Call For: Dr Juanda Chance Reason for Call: Refill Medication Summary of Call: Just realized her prescription ran out Nexium [facility] [address]. Initial call taken by: Leanor Kail [facility],  April 05, 2010 1:52 PM  Follow-up for Phone Call        Explained to patient that per our records, she should have been out of Nexium 2 months ago. She states that her sister was on Nexium and then changed to another prescription so she took her remaining Nexium prescription from her sister. I will send new prescription to patient's pharmacy. I have reiterated the importance of taking a daily PPI due to her history of esophagitis and GERD. She states she has been taking PPI daily.  Follow-up by: Lamona Curl CMA Duncan Dull),  April 05, 2010 2:05 PM    New/Updated Medications: NEXIUM 40 MG CPDR (ESOMEPRAZOLE MAGNESIUM) Take 1 tablet by mouth once daily Prescriptions: NEXIUM 40 MG CPDR (ESOMEPRAZOLE MAGNESIUM) Take 1 tablet by mouth once daily  #30 x 3   Entered by:   Lamona Curl CMA (AAMA)   Authorized by:   Hart Carwin MD   Signed by:   Lamona Curl CMA (AAMA) on 04/05/2010   Method used:   Electronically to        [facility] [address] [phone]* (retail)       [address]       [place], [place]  [address]       Ph: [phone] or [phone]       Fax: [phone]   RxID:   [id number]

## 2010-08-16 ENCOUNTER — Encounter (HOSPITAL_BASED_OUTPATIENT_CLINIC_OR_DEPARTMENT_OTHER): Payer: PRIVATE HEALTH INSURANCE | Admitting: Oncology

## 2010-08-16 DIAGNOSIS — Z170 Estrogen receptor positive status [ER+]: Secondary | ICD-10-CM

## 2010-08-16 DIAGNOSIS — C50419 Malignant neoplasm of upper-outer quadrant of unspecified female breast: Secondary | ICD-10-CM

## 2010-08-30 NOTE — H&P (Signed)
Samantha Archer, Samantha Archer                       ACCOUNT NO.:  [id number]   MEDICAL RECORD NO.:  [id number]                   PATIENT TYPE:  AMB   LOCATION:  SDC                                  [facility]ILITY[facility]   PHYSICIAN:  James A. Ashley Royalty, M.D.             DATE OF BIRTH:  1950-09-14   DATE OF ADMISSION:  08/04/2003  DATE OF DISCHARGE:                                HISTORY & PHYSICAL   HISTORY OF PRESENT ILLNESS:  This is a 60 year old, gravida 2, para 2 with a  history of breast carcinoma. The patient was found to have an endometrial  polyp on pelvic ultrasound and is hence for diagnostic/operative  hysteroscopy and dilatation and curettage.   MEDICATIONS:  1. Synthroid 0.1 mg per day.  2. Aromasin 25 mg daily.   PAST MEDICAL HISTORY:  1. Medical history of breast carcinoma with bilateral breast reconstruction.  2. Hypothyroidism currently on replacement.  3. History of thromboembolic event in the leg in 1974.  4. __________  5. History of peptic ulcer disease.   PAST SURGICAL HISTORY:  Bilateral mastectomy with subsequent reconstruction.   ALLERGIES:  None.   FAMILY HISTORY:  Positive for diabetes and uterine cancer.   SOCIAL HISTORY:  The patient drinks alcohol socially, she denies the use of  tobacco. She is married.   REVIEW OF SYMPTOMS:  Noncontributory.   PHYSICAL EXAMINATION:  GENERAL:  A well-developed, well-nourished, pleasant  white female in no acute distress.  VITAL SIGNS:  Afebrile, vital signs stable.  SKIN:  Warm and dry without lesions.  LYMPH NODES:  No supraclavicular, cervical or inguinal adenopathy.  HEENT:  Normocephalic.  NECK:  Supple without thyromegaly.  CHEST:  Lungs are clear.  CARDIAC:  Regular rate and rhythm without murmur, gallop or rub.  BREAST:  Reveals evidence of bilateral mastectomy with subsequent  reconstruction.  There were no masses.  ABDOMEN:  Soft, nontender without masses or organomegaly. Bowel sounds were  active.  MUSCULOSKELETAL:  Reveals full range of motion without edema, cyanosis or  CVA tenderness.  PELVIC:  (July 21, 2003)  External genitalia within normal limits. Vagina  and cervix were without __________.  Bimanual examination reveals the uterus  to be approximately 8 x 4 x 4 cm and no adnexal masses were palpable.   IMPRESSION:  1. History of breast carcinoma.  2. Endometrial polyp on ultrasound.  3. Hypothyroidism.   PLAN:  1. Diagnostic/operative hysteroscopy.  2. Dilatation and curettage.   The risks, benefits, and alternatives were discussed with the patient. She  states she understands and accepts.  Questions were invited and answered.                                               James A. Ashley Royalty, M.D.    JAM/MEDQ  D:  08/03/2003  T:  08/04/2003  Job:  [id number]

## 2010-08-30 NOTE — Op Note (Signed)
Samantha Archer, STARLIPER                       ACCOUNT NO.:  [id number]   MEDICAL RECORD NO.:  [id number]                   PATIENT TYPE:  AMB   LOCATION:  SDC                                  [facility]   PHYSICIAN:  James A. Ashley Royalty, M.D.             DATE OF BIRTH:  11-07-1950   DATE OF PROCEDURE:  08/04/2003  DATE OF DISCHARGE:                                 OPERATIVE REPORT   PREOPERATIVE DIAGNOSES:  1. Endometrial polyp.  2. Breast carcinoma.   POSTOPERATIVE DIAGNOSES:  1. Endometrial polyp.  2. Breast carcinoma, pathology pending.   PROCEDURE:  1. Diagnostic/operative hysteroscopy, polypectomy.  2. Dilatation and curettage.   SURGEON:  Rudy Jew. Ashley Royalty, M.D.   ANESTHESIA:  Monitored anesthesia care with 1% Xylocaine paracervical block  (20 mL).   ESTIMATED BLOOD LOSS:  25 mL.   COMPLICATIONS:  None.   PACKS AND DRAINS:  None.   DESCRIPTION OF PROCEDURE:  The patient was taken to the operating room and  placed in the dorsal supine position.  After adequate IV sedation was  administered, she was placed in the lithotomy position, prepped and draped  in the usual manner for vaginal surgery.  The posterior weighted retractor  was placed per vagina.  The anterior lip of the cervix was grasped with a  single-tooth tenaculum. Approximately 20 mL of 1% Xylocaine were instilled  to obtain a paracervical block.  The uterus was sounded to 8 cm and noted to  be anteverted.  The cervix was then serially dilated to a size 29 [place]  using [employer] dilators.  The resectoscope was placed without difficulty.  Sorbitol was used as a distention medium.  Immediately upon placing the  resectoscope in the uterine cavity a large endometrial polyp was  encountered.  It was difficult to ascertain the origin of the polyp  initially but subsequently it was found to be a posterior fundal attachment.  The tubal ostia were seen bilaterally.  Using the resectoscope at 50 watts,  cutting  waveform, the polyp was excised in a piecemeal fashion and submitted  to pathology for histologic studies.  Appropriate photos were obtained  before and after.  The endometrial bed was noted to be hemostatic.  The  coagulation wave form at 40 watts power was used to ensure hemostasis.  The  pressure was __________ and hemostasis noted to be intact.   Attention was then turned to the uterine curettage.  A medium-sized curette  was introduced into the uterine cavity.  A four-quadrant technique was  initially used for the curettage.  Then a therapeutic technique was used.  The curettings were noted to be very scanty, as expected from her  postmenopausal status off any exogenous hormones.   At this point the patient was felt to have benefitted maximally from the  surgical procedure.  The vaginal instruments were removed, hemostasis was  noted, and the procedure terminated.   The patient  was then taken to the recovery room in excellent condition.                                               James A. Ashley Royalty, M.D.    JAM/MEDQ  D:  08/04/2003  T:  08/05/2003  Job:  [id number]

## 2010-09-13 HISTORY — PX: FRACTURE SURGERY: SHX138

## 2010-10-01 ENCOUNTER — Other Ambulatory Visit: Payer: Self-pay | Admitting: Orthopedic Surgery

## 2010-10-01 ENCOUNTER — Ambulatory Visit
Admission: RE | Admit: 2010-10-01 | Discharge: 2010-10-01 | Disposition: A | Discharge status: Home / Self Care | Payer: PRIVATE HEALTH INSURANCE | Source: Ambulatory Visit | Attending: Orthopedic Surgery | Admitting: Orthopedic Surgery

## 2010-10-01 DIAGNOSIS — M25562 Pain in left knee: Secondary | ICD-10-CM

## 2011-05-08 ENCOUNTER — Other Ambulatory Visit: Payer: Self-pay | Admitting: Internal Medicine

## 2011-05-09 ENCOUNTER — Telehealth: Payer: Self-pay | Admitting: *Deleted

## 2011-05-09 MED ORDER — PANTOPRAZOLE SODIUM 40 MG PO TBEC: 40.0000 mg | DELAYED_RELEASE_TABLET | Freq: Every day | ORAL | Status: DC

## 2011-05-09 NOTE — Telephone Encounter (Signed)
rx sent as per Dr Regino Schultze request.

## 2011-05-09 NOTE — Telephone Encounter (Signed)
Message copied by Richardson Chiquito on Fri May 09, 2011  8:08 AM ------      Message from: Hart Carwin      Created: Thu May 08, 2011  9:57 PM      Regarding: Protonix refill       Dottie, please refill Nayah's Protonix 40mg , #30, 1 po qd, x 1 year. Thanx DB

## 2011-05-24 ENCOUNTER — Telehealth: Payer: Self-pay | Admitting: Oncology

## 2011-05-24 NOTE — Telephone Encounter (Signed)
l/m and mailed appt aom

## 2011-06-27 ENCOUNTER — Telehealth: Payer: Self-pay | Admitting: Oncology

## 2011-06-27 NOTE — Telephone Encounter (Signed)
Called pt, appt was moved from 08/15/11 to 5/6 per MD's PAL, left message

## 2011-08-15 ENCOUNTER — Ambulatory Visit: Payer: PRIVATE HEALTH INSURANCE | Admitting: Oncology

## 2011-08-15 ENCOUNTER — Other Ambulatory Visit: Payer: PRIVATE HEALTH INSURANCE | Admitting: Lab

## 2011-08-18 ENCOUNTER — Ambulatory Visit: Payer: PRIVATE HEALTH INSURANCE | Admitting: Oncology

## 2011-08-18 ENCOUNTER — Other Ambulatory Visit: Payer: PRIVATE HEALTH INSURANCE | Admitting: Lab

## 2011-08-19 ENCOUNTER — Encounter: Payer: Self-pay | Admitting: Oncology

## 2011-08-19 NOTE — Progress Notes (Signed)
The patient to report for her visit today. She has a history of metachronous primary bilateral breast cancers. 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  She is status post bilateral mastectomies with reconstruction procedures.  She received 6 cycles of Adriamycin/Cytoxan, then 3 doses of Taxol adjuvant chemotherapy following the diagnosis of the invasive cancer in 1999.  Taxol had to be discontinued due to development of significant neuropathy.  She took tamoxifen for 5 years.  She was unable to tolerate an aromatase inhibitor.   She is out over 20 years from initial cancer diagnosis and almost 14 years from diagnosis of the second invasive cancer. She can be seen here in the future on an as-needed basis.

## 2011-08-22 ENCOUNTER — Ambulatory Visit: Payer: PRIVATE HEALTH INSURANCE | Admitting: Oncology

## 2011-08-22 ENCOUNTER — Other Ambulatory Visit: Payer: PRIVATE HEALTH INSURANCE | Admitting: Lab

## 2011-12-26 ENCOUNTER — Encounter (HOSPITAL_BASED_OUTPATIENT_CLINIC_OR_DEPARTMENT_OTHER): Payer: Self-pay | Admitting: *Deleted

## 2011-12-29 ENCOUNTER — Encounter (HOSPITAL_BASED_OUTPATIENT_CLINIC_OR_DEPARTMENT_OTHER): Payer: Self-pay | Admitting: *Deleted

## 2011-12-29 NOTE — Progress Notes (Signed)
No labs needed bilat mastectomies-nodes-no lymphadema

## 2011-12-31 NOTE — H&P (Signed)
  Samantha Archer/[facility] [address]   [address] [place], [place] [address] [phone] A Division of [facility]  Loreta Ave, M.D.     Robert A. Thurston Hole, M.D.     Lunette Stands, M.D. Eulas Post, M.D.    Buford Dresser, M.D. Estell Harpin, M.D. Genene Churn. Barry Dienes, PA-C            Kirstin A. Shepperson, PA-C [place], OPA-C   RE: [name], [name]                                [phone]      DOB: 1950-10-10 PROGRESS NOTE: 10-07-11 [age] year-old white female with a history of left knee patellofemoral chondromalacia.  Returns.  Status post revision open reduction internal fixation patella fracture on October 07, 2010.  Today she is complaining of quad weakness with increased activity.  Patellofemoral pain with going up and down stairs.  She has been trying to exercise, but is somewhat apprehensive doing activities.    EXAMINATION: Pleasant white female, alert and oriented x 3 and in no acute distress.  She continues to be tender over the quadriceps tendon.  Good left knee range of motion.  Positive patellofemoral crepitus and grind.  Not much swelling.  Calf non-tender.  Neurovascularly intact.  Skin warm and dry.  No increase in respiratory effort.  X-RAYS: Left knee AP, lateral and sunrise views show good healing of her patella fracture.  Hardware intact.    DISPOSITION:  Today we discussed hardware removal and she will let us know if she is wanting to have this done.  She will continue home exercise program.  We do not recommend a settlement yet in her case until she makes final decision on future treatment of her knee.  All questions answered.  Loreta Ave, M.D.   Electronically verified by Loreta Ave, M.D. DFM(JMO):jjh D 10-07-11 T 10-08-11

## 2012-01-01 ENCOUNTER — Encounter (HOSPITAL_BASED_OUTPATIENT_CLINIC_OR_DEPARTMENT_OTHER): Payer: Self-pay | Admitting: *Deleted

## 2012-01-01 ENCOUNTER — Encounter (HOSPITAL_BASED_OUTPATIENT_CLINIC_OR_DEPARTMENT_OTHER): Payer: Self-pay

## 2012-01-01 ENCOUNTER — Encounter (HOSPITAL_BASED_OUTPATIENT_CLINIC_OR_DEPARTMENT_OTHER): Payer: Self-pay | Admitting: Anesthesiology

## 2012-01-01 ENCOUNTER — Ambulatory Visit (HOSPITAL_BASED_OUTPATIENT_CLINIC_OR_DEPARTMENT_OTHER): Payer: No Typology Code available for payment source | Admitting: Anesthesiology

## 2012-01-01 ENCOUNTER — Ambulatory Visit (HOSPITAL_BASED_OUTPATIENT_CLINIC_OR_DEPARTMENT_OTHER)
Admission: RE | Admit: 2012-01-01 | Discharge: 2012-01-01 | Disposition: A | Discharge status: Home / Self Care | Payer: No Typology Code available for payment source | Source: Ambulatory Visit | Attending: Orthopedic Surgery | Admitting: Orthopedic Surgery

## 2012-01-01 ENCOUNTER — Encounter (HOSPITAL_BASED_OUTPATIENT_CLINIC_OR_DEPARTMENT_OTHER)
Admission: RE | Disposition: A | Discharge status: Home / Self Care | Payer: Self-pay | Source: Ambulatory Visit | Attending: Orthopedic Surgery

## 2012-01-01 DIAGNOSIS — S8290XS Unspecified fracture of unspecified lower leg, sequela: Secondary | ICD-10-CM | POA: Insufficient documentation

## 2012-01-01 DIAGNOSIS — Z5333 Arthroscopic surgical procedure converted to open procedure: Secondary | ICD-10-CM | POA: Insufficient documentation

## 2012-01-01 DIAGNOSIS — M2340 Loose body in knee, unspecified knee: Secondary | ICD-10-CM | POA: Insufficient documentation

## 2012-01-01 DIAGNOSIS — E039 Hypothyroidism, unspecified: Secondary | ICD-10-CM | POA: Insufficient documentation

## 2012-01-01 DIAGNOSIS — M2240 Chondromalacia patellae, unspecified knee: Secondary | ICD-10-CM | POA: Insufficient documentation

## 2012-01-01 DIAGNOSIS — X58XXXS Exposure to other specified factors, sequela: Secondary | ICD-10-CM | POA: Insufficient documentation

## 2012-01-01 DIAGNOSIS — T8489XA Other specified complication of internal orthopedic prosthetic devices, implants and grafts, initial encounter: Secondary | ICD-10-CM | POA: Insufficient documentation

## 2012-01-01 DIAGNOSIS — Z9889 Other specified postprocedural states: Secondary | ICD-10-CM

## 2012-01-01 DIAGNOSIS — M25569 Pain in unspecified knee: Secondary | ICD-10-CM | POA: Insufficient documentation

## 2012-01-01 DIAGNOSIS — Y849 Medical procedure, unspecified as the cause of abnormal reaction of the patient, or of later complication, without mention of misadventure at the time of the procedure: Secondary | ICD-10-CM | POA: Insufficient documentation

## 2012-01-01 DIAGNOSIS — K219 Gastro-esophageal reflux disease without esophagitis: Secondary | ICD-10-CM | POA: Insufficient documentation

## 2012-01-01 HISTORY — DX: Other specified postprocedural states: Z98.890

## 2012-01-01 HISTORY — DX: Other specified postprocedural states: R11.2

## 2012-01-01 HISTORY — PX: HARDWARE REMOVAL: SHX979

## 2012-01-01 HISTORY — DX: Acute embolism and thrombosis of unspecified deep veins of unspecified lower extremity: I82.409

## 2012-01-01 HISTORY — DX: Polyneuropathy, unspecified: G62.9

## 2012-01-01 HISTORY — DX: Gastro-esophageal reflux disease without esophagitis: K21.9

## 2012-01-01 HISTORY — PX: KNEE ARTHROSCOPY: SHX127

## 2012-01-01 HISTORY — DX: Malignant (primary) neoplasm, unspecified: C80.1

## 2012-01-01 HISTORY — DX: Hypothyroidism, unspecified: E03.9

## 2012-01-01 HISTORY — DX: Unspecified osteoarthritis, unspecified site: M19.90

## 2012-01-01 LAB — POCT HEMOGLOBIN-HEMACUE: Hemoglobin: 14.3 g/dL (ref 12.0–15.0)

## 2012-01-01 SURGERY — ARTHROSCOPY, KNEE
Anesthesia: General | Site: Knee | Laterality: Left | Wound class: Clean

## 2012-01-01 MED ORDER — VANCOMYCIN HCL IN DEXTROSE 1-5 GM/200ML-% IV SOLN
1000.0000 mg | INTRAVENOUS | Status: AC
  Administered 2012-01-01: 1000 mg via INTRAVENOUS

## 2012-01-01 MED ORDER — SODIUM CHLORIDE 0.9 % IR SOLN
Status: DC | PRN
  Administered 2012-01-01: 6000 mL

## 2012-01-01 MED ORDER — MIDAZOLAM HCL 2 MG/2ML IJ SOLN
1.0000 mg | INTRAMUSCULAR | Status: DC | PRN
  Administered 2012-01-01: 2 mg via INTRAVENOUS

## 2012-01-01 MED ORDER — OXYCODONE-ACETAMINOPHEN 5-325 MG PO TABS: 1.0000 | ORAL_TABLET | ORAL | Status: DC | PRN

## 2012-01-01 MED ORDER — FENTANYL CITRATE 0.05 MG/ML IJ SOLN: 50.0000 ug | Freq: Once | INTRAMUSCULAR | Status: DC

## 2012-01-01 MED ORDER — BUPIVACAINE-EPINEPHRINE PF 0.5-1:200000 % IJ SOLN
INTRAMUSCULAR | Status: DC | PRN
  Administered 2012-01-01: 25 mL

## 2012-01-01 MED ORDER — ONDANSETRON HCL 4 MG/2ML IJ SOLN
INTRAMUSCULAR | Status: DC | PRN
  Administered 2012-01-01: 4 mg via INTRAVENOUS

## 2012-01-01 MED ORDER — LACTATED RINGERS IV SOLN
INTRAVENOUS | Status: DC
  Administered 2012-01-01 (×2): via INTRAVENOUS

## 2012-01-01 MED ORDER — FENTANYL CITRATE 0.05 MG/ML IJ SOLN
50.0000 ug | Freq: Once | INTRAMUSCULAR | Status: AC
  Administered 2012-01-01: 100 ug via INTRAVENOUS

## 2012-01-01 MED ORDER — DEXAMETHASONE SODIUM PHOSPHATE 4 MG/ML IJ SOLN
INTRAMUSCULAR | Status: DC | PRN
  Administered 2012-01-01: 10 mg via INTRAVENOUS

## 2012-01-01 MED ORDER — SCOPOLAMINE 1 MG/3DAYS TD PT72
1.0000 | MEDICATED_PATCH | TRANSDERMAL | Status: DC
  Administered 2012-01-01: 1.5 mg via TRANSDERMAL

## 2012-01-01 MED ORDER — PROMETHAZINE HCL 25 MG/ML IJ SOLN
6.2500 mg | INTRAMUSCULAR | Status: DC | PRN
  Administered 2012-01-01: 6.25 mg via INTRAVENOUS

## 2012-01-01 MED ORDER — MIDAZOLAM HCL 2 MG/2ML IJ SOLN: 1.0000 mg | INTRAMUSCULAR | Status: DC | PRN

## 2012-01-01 MED ORDER — PROPOFOL 10 MG/ML IV BOLUS
INTRAVENOUS | Status: DC | PRN
  Administered 2012-01-01: 150 mg via INTRAVENOUS

## 2012-01-01 MED ORDER — LIDOCAINE HCL (CARDIAC) 20 MG/ML IV SOLN
INTRAVENOUS | Status: DC | PRN
  Administered 2012-01-01: 100 mg via INTRAVENOUS

## 2012-01-01 SURGICAL SUPPLY — 46 items
APL SKNCLS STERI-STRIP NONHPOA (GAUZE/BANDAGES/DRESSINGS) ×1
BANDAGE ELASTIC 6 VELCRO ST LF (GAUZE/BANDAGES/DRESSINGS) ×2 IMPLANT
BENZOIN TINCTURE PRP APPL 2/3 (GAUZE/BANDAGES/DRESSINGS) ×1 IMPLANT
BLADE CUDA 5.5 (BLADE) IMPLANT
BLADE CUDA GRT WHITE 3.5 (BLADE) IMPLANT
BLADE CUTTER GATOR 3.5 (BLADE) ×2 IMPLANT
BLADE CUTTER MENIS 5.5 (BLADE) IMPLANT
BLADE GREAT WHITE 4.2 (BLADE) ×2 IMPLANT
BLADE SURG 15 STRL LF DISP TIS (BLADE) IMPLANT
BLADE SURG 15 STRL SS (BLADE) ×10
BUR OVAL 4.0 (BURR) IMPLANT
CANISTER OMNI JUG 16 LITER (MISCELLANEOUS) ×2 IMPLANT
CANISTER SUCTION 2500CC (MISCELLANEOUS) IMPLANT
CLOTH BEACON ORANGE TIMEOUT ST (SAFETY) ×2 IMPLANT
CUTTER MENISCUS  4.2MM (BLADE)
CUTTER MENISCUS 4.2MM (BLADE) IMPLANT
DRAPE ARTHROSCOPY W/POUCH 90 (DRAPES) ×2 IMPLANT
DURAPREP 26ML APPLICATOR (WOUND CARE) ×2 IMPLANT
ELECT MENISCUS 165MM 90D (ELECTRODE) IMPLANT
ELECT REM PT RETURN 9FT ADLT (ELECTROSURGICAL) ×2
ELECTRODE REM PT RTRN 9FT ADLT (ELECTROSURGICAL) IMPLANT
GAUZE XEROFORM 1X8 LF (GAUZE/BANDAGES/DRESSINGS) ×2 IMPLANT
GLOVE BIO SURGEON STRL SZ 6.5 (GLOVE) ×1 IMPLANT
GLOVE BIOGEL PI IND STRL 7.0 (GLOVE) IMPLANT
GLOVE BIOGEL PI IND STRL 8 (GLOVE) ×1 IMPLANT
GLOVE BIOGEL PI INDICATOR 7.0 (GLOVE) ×1
GLOVE BIOGEL PI INDICATOR 8 (GLOVE) ×1
GLOVE ORTHO TXT STRL SZ7.5 (GLOVE) ×4 IMPLANT
GOWN BRE IMP PREV XXLGXLNG (GOWN DISPOSABLE) ×2 IMPLANT
GOWN PREVENTION PLUS XLARGE (GOWN DISPOSABLE) ×3 IMPLANT
HOLDER KNEE FOAM BLUE (MISCELLANEOUS) ×2 IMPLANT
IMMOBILIZER KNEE 22  40 CIR (ORTHOPEDIC SUPPLIES) ×1
IMMOBILIZER KNEE 22 40 CIR (ORTHOPEDIC SUPPLIES) IMPLANT
KNEE WRAP E Z 3 GEL PACK (MISCELLANEOUS) ×1 IMPLANT
PACK ARTHROSCOPY DSU (CUSTOM PROCEDURE TRAY) ×2 IMPLANT
PACK BASIN DAY SURGERY FS (CUSTOM PROCEDURE TRAY) ×2 IMPLANT
PENCIL BUTTON HOLSTER BLD 10FT (ELECTRODE) ×1 IMPLANT
SET ARTHROSCOPY TUBING (MISCELLANEOUS) ×2
SET ARTHROSCOPY TUBING LN (MISCELLANEOUS) ×1 IMPLANT
SPONGE GAUZE 4X4 12PLY (GAUZE/BANDAGES/DRESSINGS) ×4 IMPLANT
STRIP CLOSURE SKIN 1/2X4 (GAUZE/BANDAGES/DRESSINGS) ×1 IMPLANT
SUT ETHILON 3 0 PS 1 (SUTURE) ×2 IMPLANT
SUT VIC AB 2-0 SH 18 (SUTURE) ×3 IMPLANT
SUT VIC AB 3-0 FS2 27 (SUTURE) IMPLANT
TOWEL OR 17X24 6PK STRL BLUE (TOWEL DISPOSABLE) ×2 IMPLANT
WATER STERILE IRR 1000ML POUR (IV SOLUTION) ×2 IMPLANT

## 2012-01-01 NOTE — Transfer of Care (Signed)
Immediate Anesthesia Transfer of Care Note  Patient: Samantha Archer  Procedure(s) Performed: Procedure(s) (LRB) with comments: ARTHROSCOPY KNEE (Left) - left knee arthroscopy with debridement/shaving( chondroplasty) hardware removal HARDWARE REMOVAL (Left) - left knee arthroscopy with debridement/shaving( chondroplasty) hardware removal  Patient Location: PACU  Anesthesia Type: GA combined with regional for post-op pain  Level of Consciousness: awake and oriented  Airway & Oxygen Therapy: Patient Spontanous Breathing and Patient connected to face mask oxygen  Post-op Assessment: Report given to PACU RN, Post -op Vital signs reviewed and stable and Patient moving all extremities  Post vital signs: Reviewed and stable  Complications: No apparent anesthesia complications

## 2012-01-01 NOTE — Anesthesia Postprocedure Evaluation (Signed)
  Anesthesia Post-op Note  Patient: Samantha Archer  Procedure(s) Performed: Procedure(s) (LRB) with comments: ARTHROSCOPY KNEE (Left) - left knee arthroscopy with debridement/shaving( chondroplasty) hardware removal HARDWARE REMOVAL (Left) - left knee arthroscopy with debridement/shaving( chondroplasty) hardware removal  Patient Location: PACU  Anesthesia Type: GA combined with regional for post-op pain  Level of Consciousness: awake, alert  and oriented  Airway and Oxygen Therapy: Patient Spontanous Breathing  Post-op Pain: mild  Post-op Assessment: Post-op Vital signs reviewed  Post-op Vital Signs: Reviewed  Complications: No apparent anesthesia complications

## 2012-01-01 NOTE — Anesthesia Preprocedure Evaluation (Addendum)
Anesthesia Evaluation  Patient identified by MRN, date of birth, ID band Patient awake    Reviewed: Allergy & Precautions, H&P , NPO status , Patient's Chart, lab work & pertinent test results  History of Anesthesia Complications (+) PONV  Airway Mallampati: I TM Distance: >3 FB Neck ROM: Full    Dental   Pulmonary  breath sounds clear to auscultation        Cardiovascular Rhythm:Regular Rate:Normal     Neuro/Psych    GI/Hepatic GERD-  Medicated and Controlled,  Endo/Other  Hypothyroidism   Renal/GU      Musculoskeletal   Abdominal   Peds  Hematology   Anesthesia Other Findings   Reproductive/Obstetrics H/o breast ca                          Anesthesia Physical Anesthesia Plan  ASA: II  Anesthesia Plan: General   Post-op Pain Management:    Induction: Intravenous  Airway Management Planned: LMA  Additional Equipment:   Intra-op Plan:   Post-operative Plan: Extubation in OR  Informed Consent: I have reviewed the patients History and Physical, chart, labs and discussed the procedure including the risks, benefits and alternatives for the proposed anesthesia with the patient or authorized representative who has indicated his/her understanding and acceptance.     Plan Discussed with: CRNA and Surgeon  Anesthesia Plan Comments:         Anesthesia Quick Evaluation

## 2012-01-01 NOTE — Anesthesia Procedure Notes (Addendum)
Anesthesia Regional Block:  Femoral nerve block  Pre-Anesthetic Checklist: ,, timeout performed, Correct Patient, Correct Site, Correct Laterality, Correct Procedure, Correct Position, site marked, Risks and benefits discussed,  Surgical consent,  Pre-op evaluation,  At surgeon's request and post-op pain management  Laterality: Left and Lower  Prep: chloraprep       Needles:  Injection technique: Single-shot  Needle Type: Echogenic Needle     Needle Length: 9cm  Needle Gauge: 21    Additional Needles:  Procedures: ultrasound guided Femoral nerve block Narrative:  Start time: 01/01/2012 1:25 PM End time: 01/01/2012 1:35 PM Injection made incrementally with aspirations every 5 mL.  Performed by: Personally  Anesthesiologist: Sheldon Silvan, MD  Additional Notes: Marcaine 0.5% with EPI 1:200000  Femoral nerve block Procedure Name: LMA Insertion Date/Time: 01/01/2012 1:45 PM Performed by: Meyer Russel Pre-anesthesia Checklist: Patient identified, Emergency Drugs available, Suction available and Patient being monitored Patient Re-evaluated:Patient Re-evaluated prior to inductionOxygen Delivery Method: Circle System Utilized Preoxygenation: Pre-oxygenation with 100% oxygen Intubation Type: IV induction Ventilation: Mask ventilation without difficulty LMA: LMA inserted LMA Size: 4.0 Number of attempts: 1 Airway Equipment and Method: bite block Placement Confirmation: positive ETCO2 and breath sounds checked- equal and bilateral Tube secured with: Tape Dental Injury: Teeth and Oropharynx as per pre-operative assessment

## 2012-01-01 NOTE — Brief Op Note (Signed)
01/01/2012  3:21 PM  PATIENT:  Samantha Archer  61 y.o. female  PRE-OPERATIVE DIAGNOSIS:  left knee chondromalacia, retained hardware left knee   POST-OPERATIVE DIAGNOSIS:  Left Knee chondromalacia, retained hardware left knee  PROCEDURE:  Procedure(s) (LRB) with comments: ARTHROSCOPY KNEE (Left) - left knee arthroscopy with debridement/shaving( chondroplasty) hardware removal HARDWARE REMOVAL (Left) - left knee arthroscopy with debridement/shaving( chondroplasty) hardware removal  SURGEON:  Surgeon(s) and Role:    * Loreta Ave, MD - Primary  PHYSICIAN ASSISTANT: Zonia Kief M     ANESTHESIA:   regional and general  EBL:  Total I/O In: 1700 [I.V.:1700] Out: -    SPECIMEN:  No Specimen  DISPOSITION OF SPECIMEN:  N/A  COUNTS:  YES  TOURNIQUET:   Total Tourniquet Time Documented: Thigh (Left) - 72 minutes   PATIENT DISPOSITION:  PACU - hemodynamically stable.

## 2012-01-01 NOTE — Interval H&P Note (Signed)
History and Physical Interval Note:  01/01/2012 7:29 AM  Samantha Archer  has presented today for surgery, with the diagnosis of left knee chondromalacia, patella, mech complic/internal orth device, implan/graft unspecified   The various methods of treatment have been discussed with the patient and family. After consideration of risks, benefits and other options for treatment, the patient has consented to  Procedure(s) (LRB) with comments: ARTHROSCOPY KNEE (Left) - left knee arthroscopy with debridement/shaving( chondroplasty) hardware removal as a surgical intervention .  The patient's history has been reviewed, patient examined, no change in status, stable for surgery.  I have reviewed the patient's chart and labs.  Questions were answered to the patient's satisfaction.     Samantha Archer

## 2012-01-01 NOTE — Progress Notes (Signed)
Assisted Dr. Crews with left, ultrasound guided, femoral block. Side rails up, monitors on throughout procedure. See vital signs in flow sheet. Tolerated Procedure well. 

## 2012-01-01 NOTE — Addendum Note (Signed)
Addendum  created 01/01/12 1723 by Kerby Nora, MD   Modules edited:Orders

## 2012-01-05 ENCOUNTER — Encounter (HOSPITAL_BASED_OUTPATIENT_CLINIC_OR_DEPARTMENT_OTHER): Payer: Self-pay | Admitting: Orthopedic Surgery

## 2012-01-05 NOTE — Op Note (Signed)
Samantha Archer, HALLSTROM NO.:  [id number]  MEDICAL RECORD NO.:  [id number]  LOCATION:                                 FACILITY:  PHYSICIAN:  Loreta Ave, M.D. DATE OF BIRTH:  25-Jun-1950  DATE OF PROCEDURE:  01/01/2012 DATE OF DISCHARGE:                              OPERATIVE REPORT   PREOPERATIVE DIAGNOSES:  Left knee status post open reduction and internal fixation of patellar fracture.  Fracture healed.  Painful retained hardware.  Posttraumatic chondromalacia of patella.  POSTOPERATIVE DIAGNOSES:  Left knee status post open reduction and internal fixation of patellar fracture.  Fracture healed.  Painful retained hardware.  Posttraumatic chondromalacia of patella. Evidence of some grade 2, mild grade 3 changes, especially in the superior patella posttraumatic.  Also focal posttraumatic grade 3 lesion in medial femoral condyle on lateral aspect with chondral debris throughout the knee.  PROCEDURE:  Left knee exam under anesthesia, arthroscopy.  Chondroplasty of patella medial femoral condyle, removal of chondral loose bodies. Open excision of 4 screws and 2 washers and Cerclage wire from patella.  SURGEON:  Loreta Ave, M.D.  ASSISTANT:  Genene Churn. Barry Dienes, [place], present throughout the entire case and necessary for timely completion of procedure.  ANESTHESIA:  General.  BLOOD LOSS:  Minimal.  SPECIMENS:  None.  CULTURES:  None.  COMPLICATION:  None.  DRESSINGS:  Soft compressive with a knee immobilizer.  TOURNIQUET:  1 hour.  DESCRIPTION OF PROCEDURE:  The patient was brought to the operating room and placed on the operating table in supine position.  After adequate anesthesia had been obtained, knee examined.  Full motion, stable knee. Tourniquet applied.  Prepped and draped in usual sterile fashion. Exsanguinated with elevation, Esmarch.  Tourniquet inflated to 350 mmHg. Proceeded first with open excision of hardware.  Longitudinal  incision was opened longitudinally.  Skin and subcutaneous tissue divided.  The front of the patella was exposed.  Cerclage wire was identified, freed up and then brought to the screw heads at the inferior pole of patella. The tendon was opened longitudinally to expose the screw heads and the wire pulled out.  The one on the medial side was easily accessed and removed.  One on the lateral side had partial bony overgrowth that had to be exposed of osteotome.  It was then backed out.  On the lateral side of the patella, longitudinal incision of the retinaculum to expose lateral border.  Both screws and washers exposed and both completely removed.  Fracture well healed.  Through the open incision, I then made medial and lateral parapatellar portals.  Arthroscope introduced, knee distended and inspected.  Fracture was well healed.  Relatively good interface at the fracture site without significant step-off.  Bottom half of the patella looked great.  Evidence of some grade 2 and 3 changes at the interface and superior patella.  Chondroplasty to a stable surface.  Good tracking.  Chondral debris removed.  Trochlea looked good.  Remaining knee examined.  Evidence of the impact changes on the medial femoral condyle about a 1.5-cm grade 2 and 3 lesion on the lateral half of the medial femoral condyle.  Chondroplasty to a stable surface.  Remaining articular cartilage of menisci cruciate ligaments intact.  Entire knee explored to be sure we had removed all fragments. Instruments and fluids were removed.  Wounds were all thoroughly irrigated.  The longitudinal opening of the tendon was closed with Vicryl.  Skin and subcutaneous tissue with Vicryl as well.  Sterile compressive dressing applied.  Tourniquet deflated and removed.  Knee immobilizer applied.  Anesthesia reversed.  Brought to recovery room. Tolerated surgery well.  No complications.     Loreta Ave, M.D.     DFM/MEDQ  D:   01/01/2012  T:  01/02/2012  Job:  [id number]

## 2012-02-05 ENCOUNTER — Telehealth: Payer: Self-pay | Admitting: *Deleted

## 2012-02-05 NOTE — Telephone Encounter (Signed)
Call transferred from scheduler.  Pt reports that she feels great & is in [place].  She reports that she has been to her endocrinologist & some labs were done & results showed increased proteins.  Additional labs have been done & she is trying to get these results to bring or send to Dr Cyndie Chime.  She reports that she is in & out of [place] & will be her next tues. & wants Dr. Cyndie Chime to look at labs in case she needs to be seen. Pt was given fax # at desk to see if labs can be faxed.

## 2012-02-13 ENCOUNTER — Telehealth: Payer: Self-pay | Admitting: *Deleted

## 2012-02-13 NOTE — Telephone Encounter (Signed)
Late Entry:  Spoke with pt yest & & reported that Labs sent to [place] from [place], [place] were normal per Dr. Cyndie Chime.  She reports that she was told that protein (serum) was high.  She just wanted Dr. Patsy Lager opinion.

## 2012-02-18 ENCOUNTER — Telehealth: Payer: Self-pay | Admitting: Oncology

## 2012-02-18 NOTE — Telephone Encounter (Signed)
Pt called and wants to r/s appt missed last May 2013 to December 2013 lab and MD

## 2012-03-15 ENCOUNTER — Ambulatory Visit (INDEPENDENT_AMBULATORY_CARE_PROVIDER_SITE_OTHER): Payer: No Typology Code available for payment source | Admitting: Internal Medicine

## 2012-03-15 ENCOUNTER — Encounter: Payer: Self-pay | Admitting: Internal Medicine

## 2012-03-15 VITALS — BP 116/72 | HR 71 | Temp 98.1°F | Resp 16 | Ht 63.5 in | Wt 135.0 lb

## 2012-03-15 DIAGNOSIS — E039 Hypothyroidism, unspecified: Secondary | ICD-10-CM

## 2012-03-15 DIAGNOSIS — M810 Age-related osteoporosis without current pathological fracture: Secondary | ICD-10-CM

## 2012-03-15 DIAGNOSIS — K219 Gastro-esophageal reflux disease without esophagitis: Secondary | ICD-10-CM

## 2012-03-15 LAB — T3: T3, Total: 62 ng/dL — ABNORMAL LOW (ref 80.0–204.0)

## 2012-03-15 MED ORDER — PANTOPRAZOLE SODIUM 40 MG PO TBEC: 40.0000 mg | DELAYED_RELEASE_TABLET | Freq: Every day | ORAL | Status: DC

## 2012-03-15 NOTE — Patient Instructions (Addendum)
Pantoprazole tablets What is this medicine? PANTOPRAZOLE (pan TOE pra zole) prevents the production of acid in the stomach. It is used to treat gastroesophageal reflux disease (GERD), inflammation of the esophagus, and Zollinger-Ellison syndrome. This medicine may be used for other purposes; ask your health care provider or pharmacist if you have questions. What should I tell my health care provider before I take this medicine? They need to know if you have any of these conditions: -liver disease -low levels of magnesium in the blood -an unusual or allergic reaction to omeprazole, lansoprazole, pantoprazole, rabeprazole, other medicines, foods, dyes, or preservatives -pregnant or trying to get pregnant -breast-feeding How should I use this medicine? Take this medicine by mouth. Swallow the tablets whole with a drink of water. Follow the directions on the prescription label. Do not crush, break, or chew. Take your medicine at regular intervals. Do not take your medicine more often than directed. Talk to your pediatrician regarding the use of this medicine in children. While this drug may be prescribed for children as young as 5 years for selected conditions, precautions do apply. Overdosage: If you think you have taken too much of this medicine contact a poison control center or emergency room at once. NOTE: This medicine is only for you. Do not share this medicine with others. What if I miss a dose? If you miss a dose, take it as soon as you can. If it is almost time for your next dose, take only that dose. Do not take double or extra doses. What may interact with this medicine? Do not take this medicine with any of the following medications: -atazanavir -nelfinavir This medicine may also interact with the following medications: -ampicillin -delavirdine -digoxin -diuretics -iron salts -medicines for fungal infections like ketoconazole, itraconazole and voriconazole -warfarin This list  may not describe all possible interactions. Give your health care provider a list of all the medicines, herbs, non-prescription drugs, or dietary supplements you use. Also tell them if you smoke, drink alcohol, or use illegal drugs. Some items may interact with your medicine. What should I watch for while using this medicine? It can take several days before your stomach pain gets better. Check with your doctor or health care professional if your condition does not start to get better, or if it gets worse. You may need blood work done while you are taking this medicine. What side effects may I notice from receiving this medicine? Side effects that you should report to your doctor or health care professional as soon as possible: -allergic reactions like skin rash, itching or hives, swelling of the face, lips, or tongue -bone, muscle or joint pain -breathing problems -chest pain or chest tightness -dark yellow or brown urine -dizziness -fast, irregular heartbeat -feeling faint or lightheaded -fever or sore throat -muscle spasm -palpitations -redness, blistering, peeling or loosening of the skin, including inside the mouth -seizures -tremors -unusual bleeding or bruising -unusually weak or tired -yellowing of the eyes or skin Side effects that usually do not require medical attention (Report these to your doctor or health care professional if they continue or are bothersome.): -constipation -diarrhea -dry mouth -headache -nausea This list may not describe all possible side effects. Call your doctor for medical advice about side effects. You may report side effects to FDA at 1-800-FDA-1088. Where should I keep my medicine? Keep out of the reach of children. Store at room temperature between 15 and 30 degrees C (59 and 86 degrees F). Protect from light and moisture.  Throw away any unused medicine after the expiration date. NOTE: This sheet is a summary. It may not cover all possible  information. If you have questions about this medicine, talk to your doctor, pharmacist, or health care provider.  2013, [employer]vier/Gold Standard. (06/19/2009 12:03:53 PM) Gastroesophageal Reflux Disease, Adult Gastroesophageal reflux disease (GERD) happens when acid from your stomach flows up into the esophagus. When acid comes in contact with the esophagus, the acid causes soreness (inflammation) in the esophagus. Over time, GERD may create small holes (ulcers) in the lining of the esophagus. CAUSES   Increased body weight. This puts pressure on the stomach, making acid rise from the stomach into the esophagus.  Smoking. This increases acid production in the stomach.  Drinking alcohol. This causes decreased pressure in the lower esophageal sphincter (valve or ring of muscle between the esophagus and stomach), allowing acid from the stomach into the esophagus.  Late evening meals and a full stomach. This increases pressure and acid production in the stomach.  A malformed lower esophageal sphincter. Sometimes, no cause is found. SYMPTOMS   Burning pain in the lower part of the mid-chest behind the breastbone and in the mid-stomach area. This may occur twice a week or more often.  Trouble swallowing.  Sore throat.  Dry cough.  Asthma-like symptoms including chest tightness, shortness of breath, or wheezing. DIAGNOSIS  Your caregiver may be able to diagnose GERD based on your symptoms. In some cases, X-rays and other tests may be done to check for complications or to check the condition of your stomach and esophagus. TREATMENT  Your caregiver may recommend over-the-counter or prescription medicines to help decrease acid production. Ask your caregiver before starting or adding any new medicines.  HOME CARE INSTRUCTIONS   Change the factors that you can control. Ask your caregiver for guidance concerning weight loss, quitting smoking, and alcohol consumption.  Avoid foods and drinks that  make your symptoms worse, such as:  Caffeine or alcoholic drinks.  Chocolate.  Peppermint or mint flavorings.  Garlic and onions.  Spicy foods.  Citrus fruits, such as oranges, lemons, or limes.  Tomato-based foods such as sauce, chili, salsa, and pizza.  Fried and fatty foods.  Avoid lying down for the 3 hours prior to your bedtime or prior to taking a nap.  Eat small, frequent meals instead of large meals.  Wear loose-fitting clothing. Do not wear anything tight around your waist that causes pressure on your stomach.  Raise the head of your bed 6 to 8 inches with wood blocks to help you sleep. Extra pillows will not help.  Only take over-the-counter or prescription medicines for pain, discomfort, or fever as directed by your caregiver.  Do not take aspirin, ibuprofen, or other nonsteroidal anti-inflammatory drugs (NSAIDs). SEEK IMMEDIATE MEDICAL CARE IF:   You have pain in your arms, neck, jaw, teeth, or back.  Your pain increases or changes in intensity or duration.  You develop nausea, vomiting, or sweating (diaphoresis).  You develop shortness of breath, or you faint.  Your vomit is green, yellow, black, or looks like coffee grounds or blood.  Your stool is red, bloody, or black. These symptoms could be signs of other problems, such as heart disease, gastric bleeding, or esophageal bleeding. MAKE SURE YOU:   Understand these instructions.  Will watch your condition.  Will get help right away if you are not doing well or get worse. Document Released: 01/08/2005 Document Revised: 06/23/2011 Document Reviewed: 10/18/2010 [facility] Patient Information 2013 [employer].  

## 2012-03-15 NOTE — Progress Notes (Signed)
  Subjective:    Patient ID: Samantha Archer, female    DOB: 10/23/1950, 61 y.o.   MRN: [id number]  HPI Hypothyroid and needs lab monitoring/has orders from endocrine. Genella Rife needs rf of pantoprazole/ warned her of side affects. Osteoporosis gets injections q61mo/ needs oral vitamin D daily   Review of Systems stable    Objective:   Physical Exam Normal       Assessment & Plan:  RF meds 1 yr

## 2012-03-16 LAB — VITAMIN D 25 HYDROXY (VIT D DEFICIENCY, FRACTURES): Vit D, 25-Hydroxy: 36 ng/mL (ref 30–89)

## 2012-03-18 ENCOUNTER — Other Ambulatory Visit: Payer: Self-pay | Admitting: Dermatology

## 2012-04-01 ENCOUNTER — Other Ambulatory Visit: Payer: Self-pay | Admitting: *Deleted

## 2012-04-01 DIAGNOSIS — E039 Hypothyroidism, unspecified: Secondary | ICD-10-CM

## 2012-04-01 DIAGNOSIS — Z853 Personal history of malignant neoplasm of breast: Secondary | ICD-10-CM

## 2012-04-02 ENCOUNTER — Other Ambulatory Visit (HOSPITAL_BASED_OUTPATIENT_CLINIC_OR_DEPARTMENT_OTHER): Payer: No Typology Code available for payment source | Admitting: Lab

## 2012-04-02 ENCOUNTER — Ambulatory Visit (HOSPITAL_BASED_OUTPATIENT_CLINIC_OR_DEPARTMENT_OTHER): Payer: No Typology Code available for payment source | Admitting: Oncology

## 2012-04-02 VITALS — BP 150/84 | HR 81 | Temp 98.1°F | Resp 20 | Ht 63.5 in | Wt 135.3 lb

## 2012-04-02 DIAGNOSIS — E039 Hypothyroidism, unspecified: Secondary | ICD-10-CM

## 2012-04-02 DIAGNOSIS — Z853 Personal history of malignant neoplasm of breast: Secondary | ICD-10-CM

## 2012-04-02 LAB — COMPREHENSIVE METABOLIC PANEL (CC13)
ALT: 15 U/L (ref 0–55)
AST: 20 U/L (ref 5–34)
Albumin: 4 g/dL (ref 3.5–5.0)
Alkaline Phosphatase: 53 U/L (ref 40–150)
Potassium: 4.3 mEq/L (ref 3.5–5.1)
Sodium: 139 mEq/L (ref 136–145)
Total Protein: 7.6 g/dL (ref 6.4–8.3)

## 2012-04-02 LAB — CBC WITH DIFFERENTIAL/PLATELET
BASO%: 0.5 % (ref 0.0–2.0)
Basophils Absolute: 0 10*3/uL (ref 0.0–0.1)
EOS%: 1 % (ref 0.0–7.0)
HGB: 14.4 g/dL (ref 11.6–15.9)
MCH: 30.3 pg (ref 25.1–34.0)
MCHC: 35.1 g/dL (ref 31.5–36.0)
RDW: 13.7 % (ref 11.2–14.5)
lymph#: 1.3 10*3/uL (ref 0.9–3.3)
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
## 2012-04-02 NOTE — Patient Instructions (Addendum)
See you in [place]!!!! Call next year when you are in town

## 2012-04-15 ENCOUNTER — Telehealth: Payer: Self-pay | Admitting: Oncology

## 2012-04-16 ENCOUNTER — Telehealth: Payer: Self-pay | Admitting: *Deleted

## 2012-04-16 NOTE — Telephone Encounter (Signed)
Message copied by Sabino Snipes on Fri Apr 16, 2012  5:10 PM ------      Message from: Levert Feinstein      Created: Wed Apr 14, 2012 11:42 AM       Call pt:  TSH is up significantly at 21.4 - should be less than 4.5. She needs to talk with her Endocrinologist about increasing her synthroid dose.

## 2012-04-16 NOTE — Telephone Encounter (Signed)
Called pt & she reports that she has seen her endocrinologist already this week

## 2012-10-29 ENCOUNTER — Telehealth: Payer: Self-pay | Admitting: *Deleted

## 2012-10-29 NOTE — Telephone Encounter (Addendum)
Pt left message that her son had his physical exam in [place] & his physician suggested some testing that she wanted to talk about.  Returned call & she states that son's physician asked if she had ever had any genetic testing for BRCA gene.  She would like to arrange this.  Call back # is [phone].  Discussed with Dr Cyndie Chime & he wasn't sure if pt ever had any testing & suggested this be researched.  Talked with pt again & she reports that genetic testing was discussed but never done due to her still working & traveling back & forth to [place].  Message left with Maylon Cos, Genetic Counselor to contact pt & pt was informed of this.

## 2013-06-08 DIAGNOSIS — Z0271 Encounter for disability determination: Secondary | ICD-10-CM

## 2013-08-29 ENCOUNTER — Telehealth: Payer: Self-pay | Admitting: Oncology

## 2013-08-29 NOTE — Telephone Encounter (Signed)
retutrned pt's call re annual appt. per 04/02/12 pof pt to f/u prn. pt informed JG has left practice [facility]) and that per her last pof she was to return prm. per pt she is a long time patient of Lavone Nian and is doing fine. per pt she will catch up w/JG in church. pt informed that should she need an appt. she can be seen by one of JG's colleague's here at the center.

## 2013-08-30 ENCOUNTER — Telehealth: Payer: Self-pay | Admitting: Internal Medicine

## 2013-08-30 DIAGNOSIS — R109 Unspecified abdominal pain: Secondary | ICD-10-CM

## 2013-08-30 NOTE — Telephone Encounter (Signed)
Spoke with patient and she states she spoke with Dr. Olevia Perches at Christmas and Dr. Olevia Perches told her she needs to have a radiology test done. She will be in town the week of June 8 and is interested in having the test done then. She also reports she had the labs Dr Olevia Perches suggested and can have those sent to [place] from [place]. Please, advise.

## 2013-08-30 NOTE — Telephone Encounter (Signed)
I don't remember well what x-ray we talked about. She says barium esophagram. I need to talk to her at some point to make sure that we are ordering the right x-rays. Please find out when she is goint to be available for me totalk to her.

## 2013-08-31 NOTE — Telephone Encounter (Signed)
Spoke with patient and she is in [place](her brother in law is having open heart surgery today). She is available on her cell for the next few days.

## 2013-08-31 NOTE — Telephone Encounter (Signed)
I have called pt on her cell phone and left a message for her to call me at home.

## 2013-09-02 ENCOUNTER — Encounter: Payer: Self-pay | Admitting: *Deleted

## 2013-09-02 NOTE — Telephone Encounter (Signed)
Message copied by Hulan Saas on Fri Sep 02, 2013  8:46 AM ------      Message from: Lafayette Dragon      Created: Thu Sep 01, 2013  6:01 PM      Regarding: schedule CT scan       Rollene Fare, I have spoken to Mrs Convery. Please schedule CT scan of the abdomen and pelvis for June 9 Tuesday with oral and IV contrast. Hx of breast cancer, abdominal pain, follow up liver lesion. Please schedule BUN/creatinine for Monday June 8 in our office. Please call pt on her cell phone with the scheduled time. thanks ------

## 2013-09-02 NOTE — Telephone Encounter (Signed)
Scheduled CT at [facility] CT on 09/20/13 at 10:30 AM. NPO 4 hour prior. Drink contrast 2 and 1 hour prior. Labs in [facility] for 09/19/13. Patient notified of appointment date, time and instructions. Contrast and instructions up front for pick up on 09/19/13.

## 2013-09-19 ENCOUNTER — Other Ambulatory Visit (INDEPENDENT_AMBULATORY_CARE_PROVIDER_SITE_OTHER): Payer: Medicare Other

## 2013-09-19 DIAGNOSIS — R109 Unspecified abdominal pain: Secondary | ICD-10-CM

## 2013-09-19 LAB — BUN: BUN: 19 mg/dL (ref 6–23)

## 2013-09-19 LAB — CREATININE, SERUM: CREATININE: 0.6 mg/dL (ref 0.4–1.2)

## 2013-09-20 ENCOUNTER — Ambulatory Visit (INDEPENDENT_AMBULATORY_CARE_PROVIDER_SITE_OTHER)
Admission: RE | Admit: 2013-09-20 | Discharge: 2013-09-20 | Disposition: A | Discharge status: Home / Self Care | Payer: Medicare Other | Source: Ambulatory Visit | Attending: Internal Medicine | Admitting: Internal Medicine

## 2013-09-20 DIAGNOSIS — R109 Unspecified abdominal pain: Secondary | ICD-10-CM

## 2013-09-20 MED ORDER — IOHEXOL 300 MG/ML  SOLN: 100.0000 mL | Freq: Once | INTRAMUSCULAR | Status: AC | PRN

## 2013-10-05 ENCOUNTER — Encounter: Payer: Self-pay | Admitting: Internal Medicine

## 2014-01-07 ENCOUNTER — Encounter: Payer: Self-pay | Admitting: Internal Medicine

## 2014-03-15 ENCOUNTER — Other Ambulatory Visit: Payer: Self-pay | Admitting: Dermatology

## 2014-03-28 ENCOUNTER — Ambulatory Visit (INDEPENDENT_AMBULATORY_CARE_PROVIDER_SITE_OTHER): Payer: Medicare Other | Admitting: Internal Medicine

## 2014-03-28 VITALS — BP 106/64 | HR 80 | Temp 98.1°F | Resp 16 | Ht 63.25 in | Wt 130.0 lb

## 2014-03-28 DIAGNOSIS — K589 Irritable bowel syndrome without diarrhea: Secondary | ICD-10-CM

## 2014-03-28 DIAGNOSIS — E039 Hypothyroidism, unspecified: Secondary | ICD-10-CM

## 2014-03-28 DIAGNOSIS — K529 Noninfective gastroenteritis and colitis, unspecified: Secondary | ICD-10-CM

## 2014-03-28 LAB — POCT CBC
Granulocyte percent: 61.5 %G (ref 37–80)
HEMATOCRIT: 41.1 % (ref 37.7–47.9)
HEMOGLOBIN: 13.6 g/dL (ref 12.2–16.2)
LYMPH, POC: 2 (ref 0.6–3.4)
MCH: 29.2 pg (ref 27–31.2)
MCHC: 33.1 g/dL (ref 31.8–35.4)
MCV: 88 fL (ref 80–97)
MID (cbc): 0.2 (ref 0–0.9)
MPV: 6.3 fL (ref 0–99.8)
POC GRANULOCYTE: 3.6 (ref 2–6.9)
POC LYMPH %: 34.3 % (ref 10–50)
POC MID %: 4.2 %M (ref 0–12)
Platelet Count, POC: 278 10*3/uL (ref 142–424)
RBC: 4.68 M/uL (ref 4.04–5.48)
RDW, POC: 12.8 %
WBC: 5.8 10*3/uL (ref 4.6–10.2)

## 2014-03-28 LAB — POCT SEDIMENTATION RATE: POCT SED RATE: 7 mm/h (ref 0–22)

## 2014-03-28 LAB — HEPATIC FUNCTION PANEL
ALK PHOS: 44 U/L (ref 39–117)
ALT: 13 U/L (ref 0–35)
AST: 15 U/L (ref 0–37)
Albumin: 4 g/dL (ref 3.5–5.2)
BILIRUBIN DIRECT: 0.1 mg/dL (ref 0.0–0.3)
BILIRUBIN INDIRECT: 0.5 mg/dL (ref 0.2–1.2)
TOTAL PROTEIN: 6.8 g/dL (ref 6.0–8.3)
Total Bilirubin: 0.6 mg/dL (ref 0.2–1.2)

## 2014-03-28 LAB — LIPASE: Lipase: 14 U/L (ref 0–75)

## 2014-03-28 MED ORDER — HYOSCYAMINE SULFATE 0.125 MG SL SUBL: 0.1250 mg | SUBLINGUAL_TABLET | SUBLINGUAL | Status: DC | PRN

## 2014-03-28 NOTE — Patient Instructions (Addendum)
Irritable Bowel Syndrome Irritable bowel syndrome (IBS) is caused by a disturbance of normal bowel function and is a common digestive disorder. You may also hear this condition called spastic colon, mucous colitis, and irritable colon. There is no cure for IBS. However, symptoms often gradually improve or disappear with a good diet, stress management, and medicine. This condition usually appears in late adolescence or early adulthood. Women develop it twice as often as men. CAUSES  After food has been digested and absorbed in the small intestine, waste material is moved into the large intestine, or colon. In the colon, water and salts are absorbed from the undigested products coming from the small intestine. The remaining residue, or fecal material, is held for elimination. Under normal circumstances, gentle, rhythmic contractions of the bowel walls push the fecal material along the colon toward the rectum. In IBS, however, these contractions are irregular and poorly coordinated. The fecal material is either retained too long, resulting in constipation, or expelled too soon, producing diarrhea. SIGNS AND SYMPTOMS  The most common symptom of IBS is abdominal pain. It is often in the lower left side of the abdomen, but it may occur anywhere in the abdomen. The pain comes from spasms of the bowel muscles happening too much and from the buildup of gas and fecal material in the colon. This pain:  Can range from sharp abdominal cramps to a dull, continuous ache.  Often worsens soon after eating.  Is often relieved by having a bowel movement or passing gas. Abdominal pain is usually accompanied by constipation, but it may also produce diarrhea. The diarrhea often occurs right after a meal or upon waking up in the morning. The stools are often soft, watery, and flecked with mucus. Other symptoms of IBS include:  Bloating.  Loss of appetite.  Heartburn.  Backache.  Dull pain in the arms or  shoulders.  Nausea.  Burping.  Vomiting.  Gas. IBS may also cause symptoms that are unrelated to the digestive system, such as:  Fatigue.  Headaches.  Anxiety.  Shortness of breath.  Trouble concentrating.  Dizziness. These symptoms tend to come and go. DIAGNOSIS  The symptoms of IBS may seem like symptoms of other, more serious digestive disorders. Your health care provider may want to perform tests to exclude these disorders.  TREATMENT Many medicines are available to help correct bowel function or relieve bowel spasms and abdominal pain. Among the medicines available are:  Laxatives for severe constipation and to help restore normal bowel habits.  Specific antidiarrheal medicines to treat severe or lasting diarrhea.  Antispasmodic agents to relieve intestinal cramps. Your health care provider may also decide to treat you with a mild tranquilizer or sedative during unusually stressful periods in your life. Your health care provider may also prescribe antidepressant medicine. The use of this medicine has been shown to reduce pain and other symptoms of IBS. Remember that if any medicine is prescribed for you, you should take it exactly as directed. Make sure your health care provider knows how well it worked for you. HOME CARE INSTRUCTIONS   Take all medicines as directed by your health care provider.  Avoid foods that are high in fat or oils, such as heavy cream, butter, frankfurters, sausage, and other fatty meats.  Avoid foods that make you go to the bathroom, such as fruit, fruit juice, and dairy products.  Cut out carbonated drinks, chewing gum, and "gassy" foods such as beans and cabbage. This may help relieve bloating and burping.    Eat foods with bran, and drink plenty of liquids with the bran foods. This helps relieve constipation.  Keep track of what foods seem to bring on your symptoms.  Avoid emotionally charged situations or circumstances that produce  anxiety.  Start or continue exercising.  Get plenty of rest and sleep. Document Released: 03/31/2005 Document Revised: 04/05/2013 Document Reviewed: 11/19/2007 [facility] Patient Information 2015 [place], [place]. This information is not intended to replace advice given to you by your health care provider. Make sure you discuss any questions you have with your health care provider. Viral Gastroenteritis Viral gastroenteritis is also known as stomach flu. This condition affects the stomach and intestinal tract. It can cause sudden diarrhea and vomiting. The illness typically lasts 3 to 8 days. Most people develop an immune response that eventually gets rid of the virus. While this natural response develops, the virus can make you quite ill. CAUSES  Many different viruses can cause gastroenteritis, such as rotavirus or noroviruses. You can catch one of these viruses by consuming contaminated food or water. You may also catch a virus by sharing utensils or other personal items with an infected person or by touching a contaminated surface. SYMPTOMS  The most common symptoms are diarrhea and vomiting. These problems can cause a severe loss of body fluids (dehydration) and a body salt (electrolyte) imbalance. Other symptoms may include:  Fever.  Headache.  Fatigue.  Abdominal pain. DIAGNOSIS  Your caregiver can usually diagnose viral gastroenteritis based on your symptoms and a physical exam. A stool sample may also be taken to test for the presence of viruses or other infections. TREATMENT  This illness typically goes away on its own. Treatments are aimed at rehydration. The most serious cases of viral gastroenteritis involve vomiting so severely that you are not able to keep fluids down. In these cases, fluids must be given through an intravenous line (IV). HOME CARE INSTRUCTIONS   Drink enough fluids to keep your urine clear or pale yellow. Drink small amounts of fluids frequently and increase  the amounts as tolerated.  Ask your caregiver for specific rehydration instructions.  Avoid:  Foods high in sugar.  Alcohol.  Carbonated drinks.  Tobacco.  Juice.  Caffeine drinks.  Extremely hot or cold fluids.  Fatty, greasy foods.  Too much intake of anything at one time.  Dairy products until 24 to 48 hours after diarrhea stops.  You may consume probiotics. Probiotics are active cultures of beneficial bacteria. They may lessen the amount and number of diarrheal stools in adults. Probiotics can be found in yogurt with active cultures and in supplements.  Wash your hands well to avoid spreading the virus.  Only take over-the-counter or prescription medicines for pain, discomfort, or fever as directed by your caregiver. Do not give aspirin to children. Antidiarrheal medicines are not recommended.  Ask your caregiver if you should continue to take your regular prescribed and over-the-counter medicines.  Keep all follow-up appointments as directed by your caregiver. SEEK IMMEDIATE MEDICAL CARE IF:   You are unable to keep fluids down.  You do not urinate at least once every 6 to 8 hours.  You develop shortness of breath.  You notice blood in your stool or vomit. This may look like coffee grounds.  You have abdominal pain that increases or is concentrated in one small area (localized).  You have persistent vomiting or diarrhea.  You have a fever.  The patient is a child younger than 3 months, and he or she has  a fever.  The patient is a child older than 3 months, and he or she has a fever and persistent symptoms.  The patient is a child older than 3 months, and he or she has a fever and symptoms suddenly get worse.  The patient is a baby, and he or she has no tears when crying. MAKE SURE YOU:   Understand these instructions.  Will watch your condition.  Will get help right away if you are not doing well or get worse. Document Released: 03/31/2005  Document Revised: 06/23/2011 Document Reviewed: 01/15/2011 [facility] Patient Information 2015 [place], [place]. This information is not intended to replace advice given to you by your health care provider. Make sure you discuss any questions you have with your health care provider.

## 2014-03-28 NOTE — Progress Notes (Signed)
   Subjective:    Patient ID: Samantha Archer, female    DOB: Apr 16, 1950, 63 y.o.   MRN: [id number]  HPI Post gastroenteritis 1 week. Had dairy for breakfast, has attacks of cramps and diarrhea but mostly feels well. No fever, fatigue, chills. Thyroid controlled and treated, no pain between cramps. No anorexia or weight loss. No urinary sxs.   Review of Systems     Objective:   Physical Exam  Constitutional: She is oriented to person, place, and time. She appears well-developed and well-nourished. No distress.  HENT:  Head: Normocephalic.  Mouth/Throat: Oropharynx is clear and moist.  Eyes: EOM are normal. Pupils are equal, round, and reactive to light.  Neck: Normal range of motion.  Cardiovascular: Normal rate.   Pulmonary/Chest: Effort normal and breath sounds normal.  Abdominal: Soft. Bowel sounds are normal. She exhibits no distension and no mass. There is tenderness. There is no rebound and no guarding.  Neurological: She is alert and oriented to person, place, and time. She exhibits normal muscle tone. Coordination normal.  Psychiatric: She has a normal mood and affect. Her behavior is normal.     Results for orders placed or performed in visit on 03/28/14  POCT CBC  Result Value Ref Range   WBC 5.8 4.6 - 10.2 K/uL   Lymph, poc 2.0 0.6 - 3.4   POC LYMPH PERCENT 34.3 10 - 50 %L   MID (cbc) 0.2 0 - 0.9   POC MID % 4.2 0 - 12 %M   POC Granulocyte 3.6 2 - 6.9   Granulocyte percent 61.5 37 - 80 %G   RBC 4.68 4.04 - 5.48 M/uL   Hemoglobin 13.6 12.2 - 16.2 g/dL   HCT, POC 41.1 37.7 - 47.9 %   MCV 88.0 80 - 97 fL   MCH, POC 29.2 27 - 31.2 pg   MCHC 33.1 31.8 - 35.4 g/dL   RDW, POC 12.8 %   Platelet Count, POC 278 142 - 424 K/uL   MPV 6.3 0 - 99.8 fL   CT abd/pelvis from 09/2013 normal     Assessment & Plan:  Resolving viral gastroenteritis IBS component Levsin and probiotics

## 2015-07-06 LAB — HM COLONOSCOPY

## 2015-07-16 LAB — HM DEXA SCAN

## 2015-07-20 ENCOUNTER — Encounter: Payer: Self-pay | Admitting: Gynecologic Oncology

## 2015-07-20 ENCOUNTER — Ambulatory Visit: Payer: Medicare Other | Attending: Gynecologic Oncology | Admitting: Gynecologic Oncology

## 2015-07-20 VITALS — BP 126/88 | HR 76 | Temp 98.3°F | Resp 18 | Ht 63.25 in | Wt 128.5 lb

## 2015-07-20 DIAGNOSIS — Z1501 Genetic susceptibility to malignant neoplasm of breast: Secondary | ICD-10-CM | POA: Diagnosis not present

## 2015-07-20 DIAGNOSIS — Z1502 Genetic susceptibility to malignant neoplasm of ovary: Secondary | ICD-10-CM

## 2015-07-20 DIAGNOSIS — Z1509 Genetic susceptibility to other malignant neoplasm: Secondary | ICD-10-CM

## 2015-07-20 DIAGNOSIS — E039 Hypothyroidism, unspecified: Secondary | ICD-10-CM | POA: Diagnosis not present

## 2015-07-20 DIAGNOSIS — Z853 Personal history of malignant neoplasm of breast: Secondary | ICD-10-CM | POA: Insufficient documentation

## 2015-07-20 DIAGNOSIS — K219 Gastro-esophageal reflux disease without esophagitis: Secondary | ICD-10-CM | POA: Insufficient documentation

## 2015-07-20 DIAGNOSIS — Z86718 Personal history of other venous thrombosis and embolism: Secondary | ICD-10-CM | POA: Insufficient documentation

## 2015-07-20 DIAGNOSIS — Z9013 Acquired absence of bilateral breasts and nipples: Secondary | ICD-10-CM | POA: Insufficient documentation

## 2015-07-20 DIAGNOSIS — G629 Polyneuropathy, unspecified: Secondary | ICD-10-CM | POA: Insufficient documentation

## 2015-07-20 DIAGNOSIS — M1990 Unspecified osteoarthritis, unspecified site: Secondary | ICD-10-CM | POA: Insufficient documentation

## 2015-07-20 DIAGNOSIS — C50919 Malignant neoplasm of unspecified site of unspecified female breast: Secondary | ICD-10-CM

## 2015-07-20 DIAGNOSIS — Z880 Allergy status to penicillin: Secondary | ICD-10-CM | POA: Insufficient documentation

## 2015-07-20 NOTE — Progress Notes (Signed)
Consult Note: Gyn-Onc  Consult was requested by Dr. Arelia Sneddon for the evaluation of Samantha Archer 65 y.o. female  CC:  Chief Complaint  Patient presents with  . BRCA 2 positive    New patient    Assessment/Plan:  Samantha Archer  is a 65 y.o.  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   I am recommending risk reducing surgery with robotic BSO, I discussed the role of hysterectomy in BRCA 2 deleterious mutations. There is no defined or confirmed increased risk for endometrial cancer that is identified, though some observational studies report increased risks of serous uterine cancer in the BRCA 1 mutated population, particularly those from [place] origin. I discussed that hysterectomy is associated with increased surgical and recovery risk compared to BSO alone. After hearing options for either BSO vs hysterectomy with BSO, the patient is electing to proceed with robotic assisted total hysterectomy with BSO.  She has a slightly narrowed introitus and is at increased risk for difficult specimen delivery and possibly perineal tear during delivery of the uterus. I discussed this with the patient.  I explained surgical risks with the patient including  bleeding, infection, damage to internal organs (such as bladder,ureters, bowels), blood clot, reoperation and rehospitalization.   HPI: Samantha Archer is a 65 year old G2P2 who is seen in consultation at the request of Dr Arelia Sneddon for a deleterious mutation in BRCA 2.    The patient has a personal history of bilateral breast cancer (the second occurrence was treated with surgery, chemotherapy and radiation). She has a strong family history for cancers including pancreatic, breast and ovarian and is Ashekazi Jewish in ancestry. As follow-up to her son's requests, she underwent genetic testing with [facility] and was confirmed to carry a deleterious mutation in BRCA 2.  She is otherwise  very healthy and fit. She has had no intraperitoneal abdominal surgeries but has undergone a TRAM flap reconstruction of her breast.  On 07/13/15 a CA 125 was drawn and was normal at 6.1.  On [place] on 07/13/15 her uterus measured 6.3cm with an endometrial stripe of 1.61mm, and no uterine masses or identified ovarian masses.  Current Meds:  Outpatient Encounter Prescriptions as of 07/20/2015  Medication Sig  . calcium carbonate, dosed in mg elemental calcium, 1250 MG/5ML Take 500 mg of elemental calcium by mouth daily.  . cholecalciferol (VITAMIN D) 1000 UNITS tablet Take 1,000 Units by mouth daily.  . hyoscyamine (LEVSIN/SL) 0.125 MG SL tablet Place 1 tablet (0.125 mg total) under the tongue every 4 (four) hours as needed.  Marland Kitchen levothyroxine (SYNTHROID, LEVOTHROID) 75 MCG tablet Take 75 mcg by mouth every other day.  . pantoprazole (PROTONIX) 40 MG tablet Take 1 tablet (40 mg total) by mouth daily.   No facility-administered encounter medications on file as of 07/20/2015.    Allergy:  Allergies  Allergen Reactions  . Penicillins     Social Hx:   Social History   Social History  . Marital Status: Married    Spouse Name: N/A  . Number of Children: N/A  . Years of Education: N/A   Occupational History  . Not on file.   Social History Main Topics  . Smoking status: Never Smoker   . Smokeless tobacco: Not on file  . Alcohol Use: Yes     Comment: daily wine  . Drug Use: No  . Sexual Activity: Not on file   Other Topics  Concern  . Not on file   Social History Narrative    Past Surgical Hx:  Past Surgical History  Procedure Laterality Date  . Hysteroscopy w/d&c  08/04/2003    also polypectomy  . Mastectomy      bilateral, with reconstruction  . Tonsillectomy    . Knee arthroplasty  6/12x2    patella-lt  . Modified radical mastectomy w/ axillary lymph node dissection  1991    right  . Modified radical mastectomy w/ axillary lymph node dissection  1999    left  .  Reconstruction breast w/ tram flap  1999    bilat  . Dilation and curettage of uterus    . Knee arthroscopy  01/01/2012    Procedure: ARTHROSCOPY KNEE;  Surgeon: Ninetta Lights, MD;  Location: [facility];  Service: Orthopedics;  Laterality: Left;  left knee arthroscopy with debridement/shaving( chondroplasty) hardware removal  . Hardware removal  01/01/2012    Procedure: HARDWARE REMOVAL;  Surgeon: Ninetta Lights, MD;  Location: [place];  Service: Orthopedics;  Laterality: Left;  left knee arthroscopy with debridement/shaving( chondroplasty) hardware removal  . Breast surgery    . Fracture surgery      Past Medical Hx:  Past Medical History  Diagnosis Date  . GERD (gastroesophageal reflux disease)   . DVT (deep venous thrombosis) ([place]) 1974  . Cancer ([place])     bilat mastectomies  . Hypothyroidism   . Arthritis   . Neuropathy ([place])   . PONV (postoperative nausea and vomiting)     Past Gynecological History:  SVD x 2  No LMP recorded. Patient is postmenopausal.  Family Hx: History reviewed. No pertinent family history.  Review of Systems:  Constitutional  Feels well,    ENT Normal appearing ears and nares bilaterally Skin/Breast  No rash, sores, jaundice, itching, dryness Cardiovascular  No chest pain, shortness of breath, or edema  Pulmonary  No cough or wheeze.  Gastro Intestinal  No nausea, vomitting, or diarrhoea. No bright red blood per rectum, no abdominal pain, change in bowel movement, or constipation.  Genito Urinary  No frequency, urgency, dysuria,  Musculo Skeletal  No myalgia, arthralgia, joint swelling or pain  Neurologic  No weakness, numbness, change in gait,  Psychology  No depression, anxiety, insomnia.   Vitals:  Blood pressure 126/88, pulse 76, temperature 98.3 F (36.8 C), temperature source Oral, resp. rate 18, height 5' 3.25" (1.607 m), weight 128 lb 8 oz (58.287 kg), SpO2 100 %.  Physical Exam: WD in  NAD Neck  Supple NROM, without any enlargements.  Lymph Node Survey No cervical supraclavicular or inguinal adenopathy Cardiovascular  Pulse normal rate, regularity and rhythm. S1 and S2 normal.  Lungs  Clear to auscultation bilateraly, without wheezes/crackles/rhonchi. Good air movement.  Skin  No rash/lesions/breakdown  Psychiatry  Alert and oriented to person, place, and time  Abdomen  Normoactive bowel sounds, abdomen soft, non-tender and thin without evidence of hernia.  Back No CVA tenderness Genito Urinary  Vulva/vagina: Normal external female genitalia.  No lesions. No discharge or bleeding.  Bladder/urethra:  No lesions or masses, well supported bladder  Vagina: narrow introitus  Cervix: Normal appearing, no lesions.  Uterus:  Small, mobile, no parametrial involvement or nodularity.  Adnexa: no palpable masses. Rectal  deferred Extremities  No bilateral cyanosis, clubbing or edema.   Donaciano Eva, MD  07/20/2015, 9:15 AM

## 2015-07-20 NOTE — Patient Instructions (Signed)
Preparing for your Surgery  Plan for surgery on May 16 , 2017 with Dr. Everitt Amber. You will be scheduled for a Robotic hysterectomy with bilateral salpingo-oophorectomy  Pre-operative Testing -You will receive a phone call from presurgical testing at [facility] to arrange for a pre-operative testing appointment before your surgery.  This appointment normally occurs one to two weeks before your scheduled surgery.   -Bring your insurance card, copy of an advanced directive if applicable, medication list  -At that visit, you will be asked to sign a consent for a possible blood transfusion in case a transfusion becomes necessary during surgery.  The need for a blood transfusion is rare but having consent is a necessary part of your care.     -You should not be taking blood thinners or aspirin at least ten days prior to surgery unless instructed by your surgeon.  Day Before Surgery at [facility] will be asked to take in only clear liquids the day before surgery.  Examples of clear liquids include broths, jello, and clear juices.  Avoid carbonated beverages.  You will be advised to have nothing to eat or drink after midnight the evening before.    Your role in recovery Your role is to become active as soon as directed by your doctor, while still giving yourself time to heal.  Rest when you feel tired. You will be asked to do the following in order to speed your recovery:  - Cough and breathe deeply. This helps toclear and expand your lungs and can prevent pneumonia. You may be given a spirometer to practice deep breathing. A staff member will show you how to use the spirometer. - Do mild physical activity. Walking or moving your legs help your circulation and body functions return to normal. A staff member will help you when you try to walk and will provide you with simple exercises. Do not try to get up or walk alone the first time. - Actively manage your pain. Managing your  pain lets you move in comfort. We will ask you to rate your pain on a scale of zero to 10. It is your responsibility to tell your doctor or nurse where and how much you hurt so your pain can be treated.  Special Considerations -If you are diabetic, you may be placed on insulin after surgery to have closer control over your blood sugars to promote healing and recovery.  This does not mean that you will be discharged on insulin.  If applicable, your oral antidiabetics will be resumed when you are tolerating a solid diet.  -Your final pathology results from surgery should be available by the Friday after surgery and the results will be relayed to you when available.  Blood Transfusion Information WHAT IS A BLOOD TRANSFUSION? A transfusion is the replacement of blood or some of its parts. Blood is made up of multiple cells which provide different functions.  Red blood cells carry oxygen and are used for blood loss replacement.  White blood cells fight against infection.  Platelets control bleeding.  Plasma helps clot blood.  Other blood products are available for specialized needs, such as hemophilia or other clotting disorders. BEFORE THE TRANSFUSION  Who gives blood for transfusions?   You may be able to donate blood to be used at a later date on yourself (autologous donation).  Relatives can be asked to donate blood. This is generally not any safer than if you have received blood from a stranger. The same  precautions are taken to ensure safety when a relative's blood is donated.  Healthy volunteers who are fully evaluated to make sure their blood is safe. This is blood bank blood. Transfusion therapy is the safest it has ever been in the practice of medicine. Before blood is taken from a donor, a complete history is taken to make sure that person has no history of diseases nor engages in risky social behavior (examples are intravenous drug use or sexual activity with multiple partners).  The donor's travel history is screened to minimize risk of transmitting infections, such as malaria. The donated blood is tested for signs of infectious diseases, such as HIV and hepatitis. The blood is then tested to be sure it is compatible with you in order to minimize the chance of a transfusion reaction. If you or a relative donates blood, this is often done in anticipation of surgery and is not appropriate for emergency situations. It takes many days to process the donated blood. RISKS AND COMPLICATIONS Although transfusion therapy is very safe and saves many lives, the main dangers of transfusion include:   Getting an infectious disease.  Developing a transfusion reaction. This is an allergic reaction to something in the blood you were given. Every precaution is taken to prevent this. The decision to have a blood transfusion has been considered carefully by your caregiver before blood is given. Blood is not given unless the benefits outweigh the risks.

## 2015-08-10 NOTE — Patient Instructions (Addendum)
YOUR PROCEDURE IS SCHEDULED ON : 08/28/15  REPORT TO [facility] MAIN ENTRANCE FOLLOW SIGNS TO EAST ELEVATOR - GO TO 3rd FLOOR CHECK IN AT 3 EAST NURSES STATION (SHORT STAY) AT:  5:00 AM  CALL THIS NUMBER IF YOU HAVE PROBLEMS THE MORNING OF SURGERY [phone]  REMEMBER:ONLY 1 PER PERSON MAY GO TO SHORT STAY WITH YOU TO GET READY THE MORNING OF YOUR SURGERY  DO NOT EAT FOOD OR DRINK LIQUIDS AFTER MIDNIGHT  TAKE THESE MEDICINES THE MORNING OF SURGERY: PROTONIX  Eat a light diet the day before surgery.  Examples including soups, broths, toast, yogurt, mashed potatoes.  Things to avoid include carbonated beverages (fizzy beverages), raw fruits and raw vegetables, or beans.   If your bowels are filled with gas, your surgeon will have difficulty visualizing your pelvic organs which increases your surgical risks.  YOU MAY NOT HAVE ANY METAL ON YOUR BODY INCLUDING HAIR PINS AND PIERCING'S. DO NOT WEAR JEWELRY, MAKEUP, LOTIONS, POWDERS OR PERFUMES. DO NOT WEAR NAIL POLISH. DO NOT SHAVE 48 HRS PRIOR TO SURGERY. MEN MAY SHAVE FACE AND NECK.  DO NOT [place]. [facility] IS NOT RESPONSIBLE FOR VALUABLES.  CONTACTS, DENTURES OR PARTIALS MAY NOT BE WORN TO SURGERY. LEAVE SUITCASE IN CAR. CAN BE BROUGHT TO ROOM AFTER SURGERY.  PATIENTS DISCHARGED THE DAY OF SURGERY WILL NOT BE ALLOWED TO DRIVE HOME.  PLEASE READ OVER THE FOLLOWING INSTRUCTION SHEETS _________________________________________________________________________________                                          [place] - PREPARING FOR SURGERY  Before surgery, you can play an important role.  Because skin is not sterile, your skin needs to be as free of germs as possible.  You can reduce the number of germs on your skin by washing with CHG (chlorahexidine gluconate) soap before surgery.  CHG is an antiseptic cleaner which kills germs and bonds with the skin to continue killing germs even after  washing. Please DO NOT use if you have an allergy to CHG or antibacterial soaps.  If your skin becomes reddened/irritated stop using the CHG and inform your nurse when you arrive at Short Stay. Do not shave (including legs and underarms) for at least 48 hours prior to the first CHG shower.  You may shave your face. Please follow these instructions carefully:   1.  Shower with CHG Soap the night before surgery and the  morning of Surgery.   2.  If you choose to wash your hair, wash your hair first as usual with your  normal  Shampoo.   3.  After you shampoo, rinse your hair and body thoroughly to remove the  shampoo.                                         4.  Use CHG as you would any other liquid soap.  You can apply chg directly  to the skin and wash . Gently wash with scrungie or clean wascloth    5.  Apply the CHG Soap to your body ONLY FROM THE NECK DOWN.   Do not use on open  Wound or open sores. Avoid contact with eyes, ears mouth and genitals (private parts).                        Genitals (private parts) with your normal soap.              6.  Wash thoroughly, paying special attention to the area where your surgery  will be performed.   7.  Thoroughly rinse your body with warm water from the neck down.   8.  DO NOT shower/wash with your normal soap after using and rinsing off  the CHG Soap .                9.  Pat yourself dry with a clean towel.             10.  Wear clean night clothes to bed after shower             11.  Place clean sheets on your bed the night of your first shower and do not  sleep with pets.  Day of Surgery : Do not apply any lotions/deodorants the morning of surgery.  Please wear clean clothes to the hospital/surgery center.  FAILURE TO FOLLOW THESE INSTRUCTIONS MAY RESULT IN THE CANCELLATION OF YOUR SURGERY    PATIENT  SIGNATURE_________________________________  ______________________________________________________________________     Samantha Archer  An incentive spirometer is a tool that can help keep your lungs clear and active. This tool measures how well you are filling your lungs with each breath. Taking long deep breaths may help reverse or decrease the chance of developing breathing (pulmonary) problems (especially infection) following:  A long period of time when you are unable to move or be active. BEFORE THE PROCEDURE   If the spirometer includes an indicator to show your best effort, your nurse or respiratory therapist will set it to a desired goal.  If possible, sit up straight or lean slightly forward. Try not to slouch.  Hold the incentive spirometer in an upright position. INSTRUCTIONS FOR USE   Sit on the edge of your bed if possible, or sit up as far as you can in bed or on a chair.  Hold the incentive spirometer in an upright position.  Breathe out normally.  Place the mouthpiece in your mouth and seal your lips tightly around it.  Breathe in slowly and as deeply as possible, raising the piston or the ball toward the top of the column.  Hold your breath for 3-5 seconds or for as long as possible. Allow the piston or ball to fall to the bottom of the column.  Remove the mouthpiece from your mouth and breathe out normally.  Rest for a few seconds and repeat Steps 1 through 7 at least 10 times every 1-2 hours when you are awake. Take your time and take a few normal breaths between deep breaths.  The spirometer may include an indicator to show your best effort. Use the indicator as a goal to work toward during each repetition.  After each set of 10 deep breaths, practice coughing to be sure your lungs are clear. If you have an incision (the cut made at the time of surgery), support your incision when coughing by placing a pillow or rolled up towels firmly against  it. Once you are able to get out of bed, walk around indoors and cough well. You may stop using the incentive spirometer when instructed by your caregiver.  RISKS AND COMPLICATIONS  Take your time so you do not get dizzy or light-headed.  If you are in pain, you may need to take or ask for pain medication before doing incentive spirometry. It is harder to take a deep breath if you are having pain. AFTER USE  Rest and breathe slowly and easily.  It can be helpful to keep track of a log of your progress. Your caregiver can provide you with a simple table to help with this. If you are using the spirometer at home, follow these instructions: [place] IF:   You are having difficultly using the spirometer.  You have trouble using the spirometer as often as instructed.  Your pain medication is not giving enough relief while using the spirometer.  You develop fever of 100.5 F (38.1 C) or higher. SEEK IMMEDIATE MEDICAL CARE IF:   You cough up bloody sputum that had not been present before.  You develop fever of 102 F (38.9 C) or greater.  You develop worsening pain at or near the incision site. MAKE SURE YOU:   Understand these instructions.  Will watch your condition.  Will get help right away if you are not doing well or get worse. Document Released: 08/11/2006 Document Revised: 06/23/2011 Document Reviewed: 10/12/2006 ExitCare Patient Information 2014 ExitCare, [place].   ________________________________________________________________________  WHAT IS A BLOOD TRANSFUSION? Blood Transfusion Information  A transfusion is the replacement of blood or some of its parts. Blood is made up of multiple cells which provide different functions.  Red blood cells carry oxygen and are used for blood loss replacement.  White blood cells fight against infection.  Platelets control bleeding.  Plasma helps clot blood.  Other blood products are available for specialized  needs, such as hemophilia or other clotting disorders. BEFORE THE TRANSFUSION  Who gives blood for transfusions?   Healthy volunteers who are fully evaluated to make sure their blood is safe. This is blood bank blood. Transfusion therapy is the safest it has ever been in the practice of medicine. Before blood is taken from a donor, a complete history is taken to make sure that person has no history of diseases nor engages in risky social behavior (examples are intravenous drug use or sexual activity with multiple partners). The donor's travel history is screened to minimize risk of transmitting infections, such as malaria. The donated blood is tested for signs of infectious diseases, such as HIV and hepatitis. The blood is then tested to be sure it is compatible with you in order to minimize the chance of a transfusion reaction. If you or a relative donates blood, this is often done in anticipation of surgery and is not appropriate for emergency situations. It takes many days to process the donated blood. RISKS AND COMPLICATIONS Although transfusion therapy is very safe and saves many lives, the main dangers of transfusion include:   Getting an infectious disease.  Developing a transfusion reaction. This is an allergic reaction to something in the blood you were given. Every precaution is taken to prevent this. The decision to have a blood transfusion has been considered carefully by your caregiver before blood is given. Blood is not given unless the benefits outweigh the risks. AFTER THE TRANSFUSION  Right after receiving a blood transfusion, you will usually feel much better and more energetic. This is especially true if your red blood cells have gotten low (anemic). The transfusion raises the level of the red blood cells which carry oxygen, and this usually  causes an energy increase.  The nurse administering the transfusion will monitor you carefully for complications. HOME CARE INSTRUCTIONS   No special instructions are needed after a transfusion. You may find your energy is better. Speak with your caregiver about any limitations on activity for underlying diseases you may have. SEEK MEDICAL CARE IF:   Your condition is not improving after your transfusion.  You develop redness or irritation at the intravenous (IV) site. SEEK IMMEDIATE MEDICAL CARE IF:  Any of the following symptoms occur over the next 12 hours:  Shaking chills.  You have a temperature by mouth above 102 F (38.9 C), not controlled by medicine.  Chest, back, or muscle pain.  People around you feel you are not acting correctly or are confused.  Shortness of breath or difficulty breathing.  Dizziness and fainting.  You get a rash or develop hives.  You have a decrease in urine output.  Your urine turns a dark color or changes to pink, red, or brown. Any of the following symptoms occur over the next 10 days:  You have a temperature by mouth above 102 F (38.9 C), not controlled by medicine.  Shortness of breath.  Weakness after normal activity.  The white part of the eye turns yellow (jaundice).  You have a decrease in the amount of urine or are urinating less often.  Your urine turns a dark color or changes to pink, red, or brown. Document Released: 03/28/2000 Document Revised: 06/23/2011 Document Reviewed: 11/15/2007 ExitCare Patient Information 2014 ExitCare, [place].  _______________________________________________________________________                                                               CLEAR LIQUID DIET   Foods Allowed                                                                     Foods Excluded  Coffee and tea, regular and decaf                             liquids that you cannot  Plain Jell-O in any flavor                                             see through such as: Fruit ices (not with fruit pulp)                                      milk, soups, orange juice  Iced Popsicles                                                      All  solid food Carbonated beverages, regular and diet                                    Cranberry, grape and apple juices Sports drinks like Gatorade Lightly seasoned clear broth or consume(fat free) Sugar, honey syrup  Sample Menu Breakfast                                Lunch                                     Supper Cranberry juice                    Beef broth                            Chicken broth Jell-O                                     Grape juice                           Apple juice Coffee or tea                        Jell-O                                      Popsicle                                                Coffee or tea                        Coffee or tea  _____________________________________________________________________

## 2015-08-13 ENCOUNTER — Encounter (INDEPENDENT_AMBULATORY_CARE_PROVIDER_SITE_OTHER): Payer: Self-pay

## 2015-08-13 ENCOUNTER — Encounter (HOSPITAL_COMMUNITY)
Admission: RE | Admit: 2015-08-13 | Discharge: 2015-08-13 | Disposition: A | Discharge status: Home / Self Care | Payer: Medicare Other | Source: Ambulatory Visit | Attending: Gynecologic Oncology | Admitting: Gynecologic Oncology

## 2015-08-13 ENCOUNTER — Encounter (HOSPITAL_COMMUNITY): Payer: Self-pay

## 2015-08-13 DIAGNOSIS — Z9011 Acquired absence of right breast and nipple: Secondary | ICD-10-CM | POA: Diagnosis not present

## 2015-08-13 DIAGNOSIS — Z853 Personal history of malignant neoplasm of breast: Secondary | ICD-10-CM | POA: Insufficient documentation

## 2015-08-13 DIAGNOSIS — K219 Gastro-esophageal reflux disease without esophagitis: Secondary | ICD-10-CM | POA: Insufficient documentation

## 2015-08-13 DIAGNOSIS — Z9012 Acquired absence of left breast and nipple: Secondary | ICD-10-CM | POA: Diagnosis not present

## 2015-08-13 DIAGNOSIS — E039 Hypothyroidism, unspecified: Secondary | ICD-10-CM | POA: Diagnosis not present

## 2015-08-13 DIAGNOSIS — Z86718 Personal history of other venous thrombosis and embolism: Secondary | ICD-10-CM | POA: Diagnosis not present

## 2015-08-13 DIAGNOSIS — Z950 Presence of cardiac pacemaker: Secondary | ICD-10-CM | POA: Insufficient documentation

## 2015-08-13 DIAGNOSIS — Z01812 Encounter for preprocedural laboratory examination: Secondary | ICD-10-CM | POA: Diagnosis present

## 2015-08-13 DIAGNOSIS — M810 Age-related osteoporosis without current pathological fracture: Secondary | ICD-10-CM | POA: Diagnosis not present

## 2015-08-13 HISTORY — DX: Age-related osteoporosis without current pathological fracture: M81.0

## 2015-08-13 HISTORY — DX: Personal history of other malignant neoplasm of skin: Z85.828

## 2015-08-13 HISTORY — DX: Diverticulosis of intestine, part unspecified, without perforation or abscess without bleeding: K57.90

## 2015-08-13 LAB — COMPREHENSIVE METABOLIC PANEL
ALK PHOS: 42 U/L (ref 38–126)
ALT: 14 U/L (ref 14–54)
AST: 19 U/L (ref 15–41)
Albumin: 4.2 g/dL (ref 3.5–5.0)
Anion gap: 9 (ref 5–15)
BUN: 15 mg/dL (ref 6–20)
CHLORIDE: 106 mmol/L (ref 101–111)
CO2: 27 mmol/L (ref 22–32)
Calcium: 9.3 mg/dL (ref 8.9–10.3)
Creatinine, Ser: 0.63 mg/dL (ref 0.44–1.00)
GFR calc non Af Amer: >60 mL/min (ref >60)
GLUCOSE: 103 mg/dL — AB (ref 65–99)
Potassium: 4.3 mmol/L (ref 3.5–5.1)
SODIUM: 142 mmol/L (ref 135–145)
TOTAL PROTEIN: 7.1 g/dL (ref 6.5–8.1)
Total Bilirubin: 0.9 mg/dL (ref 0.3–1.2)

## 2015-08-13 LAB — URINALYSIS, ROUTINE W REFLEX MICROSCOPIC
Bilirubin Urine: NEGATIVE
GLUCOSE, UA: NEGATIVE mg/dL
Hgb urine dipstick: NEGATIVE
Ketones, ur: NEGATIVE mg/dL
LEUKOCYTES UA: NEGATIVE
Nitrite: NEGATIVE
PROTEIN: NEGATIVE mg/dL
Specific Gravity, Urine: 1.02 (ref 1.005–1.030)
pH: 5.5 (ref 5.0–8.0)

## 2015-08-13 LAB — CBC WITH DIFFERENTIAL/PLATELET
BASOS ABS: 0.1 10*3/uL (ref 0.0–0.1)
Basophils Relative: 1 %
EOS ABS: 0.1 10*3/uL (ref 0.0–0.7)
Eosinophils Relative: 2 %
HCT: 40.2 % (ref 36.0–46.0)
HEMOGLOBIN: 13.8 g/dL (ref 12.0–15.0)
LYMPHS ABS: 1.4 10*3/uL (ref 0.7–4.0)
LYMPHS PCT: 26 %
MCH: 29.4 pg (ref 26.0–34.0)
MCHC: 34.3 g/dL (ref 30.0–36.0)
MCV: 85.5 fL (ref 78.0–100.0)
Monocytes Absolute: 0.6 10*3/uL (ref 0.1–1.0)
Monocytes Relative: 11 %
NEUTROS PCT: 60 %
Neutro Abs: 3.2 10*3/uL (ref 1.7–7.7)
PLATELETS: 249 10*3/uL (ref 150–400)
RBC: 4.7 MIL/uL (ref 3.87–5.11)
RDW: 13.2 % (ref 11.5–15.5)
WBC: 5.4 10*3/uL (ref 4.0–10.5)

## 2015-08-28 ENCOUNTER — Ambulatory Visit (HOSPITAL_COMMUNITY): Payer: Medicare Other | Admitting: Certified Registered Nurse Anesthetist

## 2015-08-28 ENCOUNTER — Ambulatory Visit (HOSPITAL_COMMUNITY)
Admission: RE | Admit: 2015-08-28 | Discharge: 2015-08-29 | Disposition: A | Discharge status: Home / Self Care | Payer: Medicare Other | Source: Ambulatory Visit | Attending: Gynecologic Oncology | Admitting: Gynecologic Oncology

## 2015-08-28 ENCOUNTER — Encounter (HOSPITAL_COMMUNITY): Payer: Self-pay | Admitting: *Deleted

## 2015-08-28 ENCOUNTER — Encounter (HOSPITAL_COMMUNITY)
Admission: RE | Disposition: A | Discharge status: Home / Self Care | Payer: Self-pay | Source: Ambulatory Visit | Attending: Gynecologic Oncology

## 2015-08-28 DIAGNOSIS — Z9013 Acquired absence of bilateral breasts and nipples: Secondary | ICD-10-CM | POA: Diagnosis not present

## 2015-08-28 DIAGNOSIS — Z1509 Genetic susceptibility to other malignant neoplasm: Secondary | ICD-10-CM

## 2015-08-28 DIAGNOSIS — D259 Leiomyoma of uterus, unspecified: Secondary | ICD-10-CM | POA: Insufficient documentation

## 2015-08-28 DIAGNOSIS — N838 Other noninflammatory disorders of ovary, fallopian tube and broad ligament: Secondary | ICD-10-CM | POA: Diagnosis not present

## 2015-08-28 DIAGNOSIS — E039 Hypothyroidism, unspecified: Secondary | ICD-10-CM | POA: Insufficient documentation

## 2015-08-28 DIAGNOSIS — Z86718 Personal history of other venous thrombosis and embolism: Secondary | ICD-10-CM | POA: Diagnosis not present

## 2015-08-28 DIAGNOSIS — K219 Gastro-esophageal reflux disease without esophagitis: Secondary | ICD-10-CM | POA: Diagnosis not present

## 2015-08-28 DIAGNOSIS — N83201 Unspecified ovarian cyst, right side: Secondary | ICD-10-CM | POA: Diagnosis not present

## 2015-08-28 DIAGNOSIS — Z800 Family history of malignant neoplasm of digestive organs: Secondary | ICD-10-CM | POA: Diagnosis not present

## 2015-08-28 DIAGNOSIS — Z1502 Genetic susceptibility to malignant neoplasm of ovary: Secondary | ICD-10-CM | POA: Diagnosis not present

## 2015-08-28 DIAGNOSIS — Z4002 Encounter for prophylactic removal of ovary: Secondary | ICD-10-CM | POA: Diagnosis present

## 2015-08-28 DIAGNOSIS — N83202 Unspecified ovarian cyst, left side: Secondary | ICD-10-CM | POA: Insufficient documentation

## 2015-08-28 DIAGNOSIS — Z8041 Family history of malignant neoplasm of ovary: Secondary | ICD-10-CM | POA: Insufficient documentation

## 2015-08-28 DIAGNOSIS — Z803 Family history of malignant neoplasm of breast: Secondary | ICD-10-CM | POA: Diagnosis not present

## 2015-08-28 DIAGNOSIS — Z79899 Other long term (current) drug therapy: Secondary | ICD-10-CM | POA: Insufficient documentation

## 2015-08-28 DIAGNOSIS — Z853 Personal history of malignant neoplasm of breast: Secondary | ICD-10-CM | POA: Diagnosis not present

## 2015-08-28 DIAGNOSIS — Z1501 Genetic susceptibility to malignant neoplasm of breast: Secondary | ICD-10-CM | POA: Insufficient documentation

## 2015-08-28 HISTORY — PX: ROBOTIC ASSISTED TOTAL HYSTERECTOMY WITH BILATERAL SALPINGO OOPHERECTOMY: SHX6086

## 2015-08-28 LAB — TYPE AND SCREEN
ABO/RH(D): O NEG
Antibody Screen: NEGATIVE

## 2015-08-28 LAB — ABO/RH: ABO/RH(D): O NEG

## 2015-08-28 SURGERY — HYSTERECTOMY, TOTAL, ROBOT-ASSISTED, LAPAROSCOPIC, WITH BILATERAL SALPINGO-OOPHORECTOMY
Anesthesia: General | Laterality: Bilateral

## 2015-08-28 MED ORDER — LIDOCAINE HCL (CARDIAC) 20 MG/ML IV SOLN
INTRAVENOUS | Status: DC | PRN
  Administered 2015-08-28: 100 mg via INTRAVENOUS

## 2015-08-28 MED ORDER — ONDANSETRON HCL 4 MG PO TABS: 4.0000 mg | ORAL_TABLET | Freq: Four times a day (QID) | ORAL | Status: DC | PRN

## 2015-08-28 MED ORDER — FENTANYL CITRATE (PF) 100 MCG/2ML IJ SOLN
INTRAMUSCULAR | Status: DC | PRN
  Administered 2015-08-28 (×5): 50 ug via INTRAVENOUS

## 2015-08-28 MED ORDER — SODIUM CHLORIDE 0.9 % IJ SOLN
INTRAMUSCULAR | Status: AC
  Filled 2015-08-28: qty 10

## 2015-08-28 MED ORDER — LIDOCAINE HCL (CARDIAC) 20 MG/ML IV SOLN
INTRAVENOUS | Status: AC
  Filled 2015-08-28: qty 5

## 2015-08-28 MED ORDER — CLINDAMYCIN PHOSPHATE 900 MG/50ML IV SOLN
INTRAVENOUS | Status: AC
  Filled 2015-08-28: qty 50

## 2015-08-28 MED ORDER — ENOXAPARIN SODIUM 40 MG/0.4ML ~~LOC~~ SOLN
40.0000 mg | SUBCUTANEOUS | Status: AC
  Administered 2015-08-28: 40 mg via SUBCUTANEOUS
  Filled 2015-08-28: qty 0.4

## 2015-08-28 MED ORDER — TRAMADOL HCL 50 MG PO TABS
100.0000 mg | ORAL_TABLET | Freq: Two times a day (BID) | ORAL | Status: DC | PRN
  Filled 2015-08-28: qty 2

## 2015-08-28 MED ORDER — CIPROFLOXACIN IN D5W 400 MG/200ML IV SOLN
INTRAVENOUS | Status: AC
  Filled 2015-08-28: qty 200

## 2015-08-28 MED ORDER — PANTOPRAZOLE SODIUM 40 MG PO TBEC: 40.0000 mg | DELAYED_RELEASE_TABLET | Freq: Every day | ORAL | Status: DC | PRN

## 2015-08-28 MED ORDER — EPHEDRINE SULFATE 50 MG/ML IJ SOLN
INTRAMUSCULAR | Status: DC | PRN
  Administered 2015-08-28: 10 mg via INTRAVENOUS

## 2015-08-28 MED ORDER — ONDANSETRON HCL 4 MG/2ML IJ SOLN: 4.0000 mg | Freq: Four times a day (QID) | INTRAMUSCULAR | Status: DC | PRN

## 2015-08-28 MED ORDER — SUGAMMADEX SODIUM 200 MG/2ML IV SOLN
INTRAVENOUS | Status: AC
  Filled 2015-08-28: qty 2

## 2015-08-28 MED ORDER — DEXAMETHASONE SODIUM PHOSPHATE 10 MG/ML IJ SOLN
INTRAMUSCULAR | Status: DC | PRN
  Administered 2015-08-28: 10 mg via INTRAVENOUS

## 2015-08-28 MED ORDER — ROCURONIUM BROMIDE 100 MG/10ML IV SOLN
INTRAVENOUS | Status: DC | PRN
  Administered 2015-08-28: 40 mg via INTRAVENOUS

## 2015-08-28 MED ORDER — DEXAMETHASONE SODIUM PHOSPHATE 10 MG/ML IJ SOLN
INTRAMUSCULAR | Status: AC
  Filled 2015-08-28: qty 1

## 2015-08-28 MED ORDER — LEVOTHYROXINE SODIUM 75 MCG PO TABS
75.0000 ug | ORAL_TABLET | Freq: Every day | ORAL | Status: DC
  Administered 2015-08-29: 75 ug via ORAL
  Filled 2015-08-28 (×2): qty 1

## 2015-08-28 MED ORDER — SUGAMMADEX SODIUM 200 MG/2ML IV SOLN
INTRAVENOUS | Status: DC | PRN
  Administered 2015-08-28: 200 mg via INTRAVENOUS

## 2015-08-28 MED ORDER — STERILE WATER FOR IRRIGATION IR SOLN
Status: DC | PRN
  Administered 2015-08-28: 1000 mL

## 2015-08-28 MED ORDER — DOCUSATE SODIUM 100 MG PO CAPS
100.0000 mg | ORAL_CAPSULE | Freq: Every day | ORAL | Status: DC
  Administered 2015-08-28: 100 mg via ORAL
  Filled 2015-08-28 (×2): qty 1

## 2015-08-28 MED ORDER — HYDROCODONE-ACETAMINOPHEN 5-325 MG PO TABS: 1.0000 | ORAL_TABLET | ORAL | Status: DC | PRN

## 2015-08-28 MED ORDER — NALOXONE HCL 0.4 MG/ML IJ SOLN
0.4000 mg | INTRAMUSCULAR | Status: DC | PRN
  Administered 2015-08-28 (×2): 0.4 mg via INTRAVENOUS

## 2015-08-28 MED ORDER — GABAPENTIN 300 MG PO CAPS
300.0000 mg | ORAL_CAPSULE | Freq: Every day | ORAL | Status: AC
  Administered 2015-08-28: 300 mg via ORAL
  Filled 2015-08-28: qty 1

## 2015-08-28 MED ORDER — HYDROMORPHONE HCL 1 MG/ML IJ SOLN
0.2500 mg | INTRAMUSCULAR | Status: DC | PRN
  Administered 2015-08-28: 0.25 mg via INTRAVENOUS

## 2015-08-28 MED ORDER — ENOXAPARIN SODIUM 40 MG/0.4ML ~~LOC~~ SOLN
40.0000 mg | SUBCUTANEOUS | Status: DC
  Administered 2015-08-29: 40 mg via SUBCUTANEOUS
  Filled 2015-08-28 (×2): qty 0.4

## 2015-08-28 MED ORDER — ONDANSETRON HCL 4 MG/2ML IJ SOLN
INTRAMUSCULAR | Status: AC
  Filled 2015-08-28: qty 2

## 2015-08-28 MED ORDER — KCL IN DEXTROSE-NACL 20-5-0.45 MEQ/L-%-% IV SOLN
INTRAVENOUS | Status: DC
  Administered 2015-08-28: 12:00:00 via INTRAVENOUS
  Administered 2015-08-29: 50 mL/h via INTRAVENOUS
  Filled 2015-08-28 (×3): qty 1000

## 2015-08-28 MED ORDER — PANTOPRAZOLE SODIUM 40 MG PO TBEC
40.0000 mg | DELAYED_RELEASE_TABLET | Freq: Every day | ORAL | Status: DC
  Administered 2015-08-29: 40 mg via ORAL
  Filled 2015-08-28 (×2): qty 1

## 2015-08-28 MED ORDER — PROPOFOL 10 MG/ML IV BOLUS
INTRAVENOUS | Status: DC | PRN
  Administered 2015-08-28: 200 mg via INTRAVENOUS

## 2015-08-28 MED ORDER — ONDANSETRON HCL 4 MG/2ML IJ SOLN
INTRAMUSCULAR | Status: DC | PRN
  Administered 2015-08-28 (×2): 4 mg via INTRAVENOUS

## 2015-08-28 MED ORDER — HYDROMORPHONE HCL 1 MG/ML IJ SOLN
INTRAMUSCULAR | Status: AC
  Filled 2015-08-28: qty 1

## 2015-08-28 MED ORDER — PROPOFOL 10 MG/ML IV BOLUS
INTRAVENOUS | Status: AC
  Filled 2015-08-28: qty 20

## 2015-08-28 MED ORDER — ROCURONIUM BROMIDE 50 MG/5ML IV SOLN
INTRAVENOUS | Status: AC
  Filled 2015-08-28: qty 2

## 2015-08-28 MED ORDER — SCOPOLAMINE 1 MG/3DAYS TD PT72
1.0000 | MEDICATED_PATCH | TRANSDERMAL | Status: DC
  Administered 2015-08-28: 1 via TRANSDERMAL

## 2015-08-28 MED ORDER — ENOXAPARIN SODIUM 40 MG/0.4ML ~~LOC~~ SOLN: 40.0000 mg | SUBCUTANEOUS | Status: DC

## 2015-08-28 MED ORDER — EPHEDRINE SULFATE 50 MG/ML IJ SOLN
INTRAMUSCULAR | Status: AC
Start: 2015-08-28 — End: 2015-08-28
  Filled 2015-08-28: qty 1

## 2015-08-28 MED ORDER — IBUPROFEN 800 MG PO TABS: 800.0000 mg | ORAL_TABLET | Freq: Three times a day (TID) | ORAL | Status: DC | PRN

## 2015-08-28 MED ORDER — LACTATED RINGERS IV SOLN
INTRAVENOUS | Status: DC | PRN
  Administered 2015-08-28: 1000 mL

## 2015-08-28 MED ORDER — SUCCINYLCHOLINE CHLORIDE 20 MG/ML IJ SOLN
INTRAMUSCULAR | Status: DC | PRN
  Administered 2015-08-28: 100 mg via INTRAVENOUS

## 2015-08-28 MED ORDER — CIPROFLOXACIN IN D5W 400 MG/200ML IV SOLN
400.0000 mg | INTRAVENOUS | Status: AC
  Administered 2015-08-28: 400 mg via INTRAVENOUS

## 2015-08-28 MED ORDER — GLYCOPYRROLATE 0.2 MG/ML IJ SOLN
INTRAMUSCULAR | Status: DC | PRN
  Administered 2015-08-28: 0.2 mg via INTRAVENOUS

## 2015-08-28 MED ORDER — HYDROMORPHONE HCL 1 MG/ML IJ SOLN: 0.2000 mg | INTRAMUSCULAR | Status: DC | PRN

## 2015-08-28 MED ORDER — LACTATED RINGERS IV SOLN
INTRAVENOUS | Status: DC | PRN
  Administered 2015-08-28 (×2): via INTRAVENOUS

## 2015-08-28 MED ORDER — CLINDAMYCIN PHOSPHATE 900 MG/50ML IV SOLN
900.0000 mg | INTRAVENOUS | Status: AC
  Administered 2015-08-28: 900 mg via INTRAVENOUS

## 2015-08-28 MED ORDER — MIDAZOLAM HCL 5 MG/5ML IJ SOLN
INTRAMUSCULAR | Status: DC | PRN
  Administered 2015-08-28: 2 mg via INTRAVENOUS

## 2015-08-28 MED ORDER — FENTANYL CITRATE (PF) 250 MCG/5ML IJ SOLN
INTRAMUSCULAR | Status: AC
  Filled 2015-08-28: qty 5

## 2015-08-28 MED ORDER — SCOPOLAMINE 1 MG/3DAYS TD PT72
MEDICATED_PATCH | TRANSDERMAL | Status: AC
  Filled 2015-08-28: qty 1

## 2015-08-28 MED ORDER — MIDAZOLAM HCL 2 MG/2ML IJ SOLN
INTRAMUSCULAR | Status: AC
  Filled 2015-08-28: qty 2

## 2015-08-28 SURGICAL SUPPLY — 57 items
APL ESCP 34 STRL LF DISP (HEMOSTASIS)
APPLICATOR SURGIFLO ENDO (HEMOSTASIS) IMPLANT
BAG SPEC RTRVL LRG 6X4 10 (ENDOMECHANICALS)
CHLORAPREP W/TINT 26ML (MISCELLANEOUS) ×2 IMPLANT
COVER SURGICAL LIGHT HANDLE (MISCELLANEOUS) ×2 IMPLANT
COVER TIP SHEARS 8 DVNC (MISCELLANEOUS) ×1 IMPLANT
COVER TIP SHEARS 8MM DA VINCI (MISCELLANEOUS) ×1
DRAPE ARM DVNC X/XI (DISPOSABLE) ×4 IMPLANT
DRAPE COLUMN DVNC XI (DISPOSABLE) ×1 IMPLANT
DRAPE DA VINCI XI ARM (DISPOSABLE) ×4
DRAPE DA VINCI XI COLUMN (DISPOSABLE) ×1
DRAPE SHEET LG 3/4 BI-LAMINATE (DRAPES) ×4 IMPLANT
DRAPE SURG IRRIG POUCH 19X23 (DRAPES) ×2 IMPLANT
ELECT REM PT RETURN 15FT ADLT (MISCELLANEOUS) ×2 IMPLANT
GLOVE BIO SURGEON STRL SZ 6 (GLOVE) ×8 IMPLANT
GLOVE BIO SURGEON STRL SZ 6.5 (GLOVE) ×4 IMPLANT
GOWN STRL REUS W/ TWL LRG LVL3 (GOWN DISPOSABLE) ×2 IMPLANT
GOWN STRL REUS W/TWL LRG LVL3 (GOWN DISPOSABLE) ×4
HOLDER FOLEY CATH W/STRAP (MISCELLANEOUS) ×2 IMPLANT
KIT BASIN OR (CUSTOM PROCEDURE TRAY) ×2 IMPLANT
KIT PROCEDURE DA VINCI SI (MISCELLANEOUS)
KIT PROCEDURE DVNC SI (MISCELLANEOUS) IMPLANT
LIQUID BAND (GAUZE/BANDAGES/DRESSINGS) ×2 IMPLANT
MANIPULATOR UTERINE 4.5 ZUMI (MISCELLANEOUS) ×2 IMPLANT
MARKER SKIN DUAL TIP RULER LAB (MISCELLANEOUS) ×2 IMPLANT
NDL SAFETY ECLIPSE 18X1.5 (NEEDLE) ×1 IMPLANT
NDL SPNL 18GX3.5 QUINCKE PK (NEEDLE) ×1 IMPLANT
NEEDLE HYPO 18GX1.5 SHARP (NEEDLE) ×2
NEEDLE SPNL 18GX3.5 QUINCKE PK (NEEDLE) ×2 IMPLANT
OBTURATOR XI 8MM BLADELESS (TROCAR) ×2 IMPLANT
OCCLUDER COLPOPNEUMO (BALLOONS) ×2 IMPLANT
PAD POSITIONING PINK XL (MISCELLANEOUS) ×2 IMPLANT
PORT ACCESS TROCAR AIRSEAL 12 (TROCAR) ×1 IMPLANT
PORT ACCESS TROCAR AIRSEAL 5M (TROCAR) ×1
POUCH ENDO CATCH II 15MM (MISCELLANEOUS) IMPLANT
POUCH SPECIMEN RETRIEVAL 10MM (ENDOMECHANICALS) IMPLANT
SEAL CANN UNIV 5-8 DVNC XI (MISCELLANEOUS) ×4 IMPLANT
SEAL XI 5MM-8MM UNIVERSAL (MISCELLANEOUS) ×4
SET TRI-LUMEN FLTR TB AIRSEAL (TUBING) ×2 IMPLANT
SET TUBE IRRIG SUCTION NO TIP (IRRIGATION / IRRIGATOR) ×2 IMPLANT
SHEET LAVH (DRAPES) ×2 IMPLANT
SOLUTION ELECTROLUBE (MISCELLANEOUS) ×2 IMPLANT
SURGIFLO W/THROMBIN 8M KIT (HEMOSTASIS) IMPLANT
SUT MNCRL AB 4-0 PS2 18 (SUTURE) ×4 IMPLANT
SUT VIC AB 0 CT1 27 (SUTURE) ×2
SUT VIC AB 0 CT1 27XBRD ANTBC (SUTURE) ×1 IMPLANT
SYR 50ML LL SCALE MARK (SYRINGE) ×2 IMPLANT
SYRINGE 10CC LL (SYRINGE) ×2 IMPLANT
TOWEL OR 17X26 10 PK STRL BLUE (TOWEL DISPOSABLE) ×4 IMPLANT
TOWEL OR NON WOVEN STRL DISP B (DISPOSABLE) ×2 IMPLANT
TRAP SPECIMEN MUCOUS 40CC (MISCELLANEOUS) IMPLANT
TRAY FOLEY W/METER SILVER 14FR (SET/KITS/TRAYS/PACK) ×2 IMPLANT
TRAY LAPAROSCOPIC (CUSTOM PROCEDURE TRAY) ×2 IMPLANT
TROCAR BLADELESS OPT 5 100 (ENDOMECHANICALS) ×2 IMPLANT
TUBING INSUFFLATION 10FT LAP (TUBING) ×1 IMPLANT
UNDERPAD 30X30 (UNDERPADS AND DIAPERS) ×2 IMPLANT
WATER STERILE IRR 1500ML POUR (IV SOLUTION) ×4 IMPLANT

## 2015-08-28 NOTE — Progress Notes (Signed)
O2 sat 84%. No chest movement noted, no response to name called. Begin ambu bag at 100%. Jerene Canny CRNA at bedside and Dr. Tobias Alexander paged. O2 sat improved to 100% with ambu bag. Remains unresponsive, continue ambu bag 100%. Narcan given and beginning to responded. Spontaneous resp return, assistance with ambu gag as needed. Awake and following commands with easy, unlabored resp on simple face mask @ 10L/min.

## 2015-08-28 NOTE — H&P (Signed)
Consult Note: Gyn-Onc  Consult was requested by Dr. Radene Knee for the evaluation of Samantha Archer 65 y.o. female  CC:  Chief Complaint  Patient presents with  . BRCA 2 positive    New patient    Assessment/Plan:  Ms. Samantha Archer is a 65 y.o. 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   I am recommending risk reducing surgery with robotic BSO, I discussed the role of hysterectomy in BRCA 2 deleterious mutations. There is no defined or confirmed increased risk for endometrial cancer that is identified, though some observational studies report increased risks of serous uterine cancer in the BRCA 1 mutated population, particularly those from [place] origin. I discussed that hysterectomy is associated with increased surgical and recovery risk compared to BSO alone. After hearing options for either BSO vs hysterectomy with BSO, the patient is electing to proceed with robotic assisted total hysterectomy with BSO.  She has a slightly narrowed introitus and is at increased risk for difficult specimen delivery and possibly perineal tear during delivery of the uterus. I discussed this with the patient.  I explained surgical risks with the patient including bleeding, infection, damage to internal organs (such as bladder,ureters, bowels), blood clot, reoperation and rehospitalization.   HPI: Samantha Archer is a 65 year old G2P2 who is seen in consultation at the request of Dr Radene Knee for a deleterious mutation in BRCA 2.   The patient has a personal history of bilateral breast cancer (the second occurrence was treated with surgery, chemotherapy and radiation). She has a strong family history for cancers including pancreatic, breast and ovarian and is [place]hek[place] Jewish in ancestry. As follow-up to her son's requests, she underwent genetic testing with [facility] and was confirmed to carry a deleterious mutation in BRCA 2.  She is  otherwise very healthy and fit. She has had no intraperitoneal abdominal surgeries but has undergone a TRAM flap reconstruction of her breast.  On 07/13/15 a CA 125 was drawn and was normal at 6.1.  On [place] on 07/13/15 her uterus measured 6.3cm with an endometrial stripe of 1.27m, and no uterine masses or identified ovarian masses.  Current Meds:  Outpatient Encounter Prescriptions as of 07/20/2015  Medication Sig  . calcium carbonate, dosed in mg elemental calcium, 1250 MG/5ML Take 500 mg of elemental calcium by mouth daily.  . cholecalciferol (VITAMIN D) 1000 UNITS tablet Take 1,000 Units by mouth daily.  . hyoscyamine (LEVSIN/SL) 0.125 MG SL tablet Place 1 tablet (0.125 mg total) under the tongue every 4 (four) hours as needed.  .Marland Kitchenlevothyroxine (SYNTHROID, LEVOTHROID) 75 MCG tablet Take 75 mcg by mouth every other day.  . pantoprazole (PROTONIX) 40 MG tablet Take 1 tablet (40 mg total) by mouth daily.   No facility-administered encounter medications on file as of 07/20/2015.    Allergy:  Allergies  Allergen Reactions  . Penicillins     Social Hx:  Social History   Social History  . Marital Status: Married    Spouse Name: N/A  . Number of Children: N/A  . Years of Education: N/A   Occupational History  . Not on file.   Social History Main Topics  . Smoking status: Never Smoker   . Smokeless tobacco: Not on file  . Alcohol Use: Yes     Comment: daily wine  . Drug Use: No  . Sexual Activity: Not on file   Other Topics Concern  . Not on  file   Social History Narrative    Past Surgical Hx:  Past Surgical History  Procedure Laterality Date  . Hysteroscopy w/d&c  08/04/2003    also polypectomy  . Mastectomy      bilateral, with reconstruction  . Tonsillectomy    . Knee arthroplasty  6/12x2    patella-lt  . Modified radical mastectomy w/ axillary lymph node  dissection  1991    right  . Modified radical mastectomy w/ axillary lymph node dissection  1999    left  . Reconstruction breast w/ tram flap  1999    bilat  . Dilation and curettage of uterus    . Knee arthroscopy  01/01/2012    Procedure: ARTHROSCOPY KNEE; Surgeon: Ninetta Lights, MD; Location: [facility]; Service: Orthopedics; Laterality: Left; left knee arthroscopy with debridement/shaving( chondroplasty) hardware removal  . Hardware removal  01/01/2012    Procedure: HARDWARE REMOVAL; Surgeon: Ninetta Lights, MD; Location: [facility]; Service: Orthopedics; Laterality: Left; left knee arthroscopy with debridement/shaving( chondroplasty) hardware removal  . Breast surgery    . Fracture surgery      Past Medical Hx:  Past Medical History  Diagnosis Date  . GERD (gastroesophageal reflux disease)   . DVT (deep venous thrombosis) ([facility]) 1974  . Cancer ([place])     bilat mastectomies  . Hypothyroidism   . Arthritis   . Neuropathy ([place])   . PONV (postoperative nausea and vomiting)     Past Gynecological History: SVD x 2 No LMP recorded. Patient is postmenopausal.  Family Hx: History reviewed. No pertinent family history.  Review of Systems:  Constitutional  Feels well,  ENT Normal appearing ears and nares bilaterally Skin/Breast  No rash, sores, jaundice, itching, dryness Cardiovascular  No chest pain, shortness of breath, or edema  Pulmonary  No cough or wheeze.  Gastro Intestinal  No nausea, vomitting, or diarrhoea. No bright red blood per rectum, no abdominal pain, change in bowel movement, or constipation.  Genito Urinary  No frequency, urgency, dysuria,  Musculo Skeletal  No myalgia, arthralgia, joint swelling or pain  Neurologic  No weakness, numbness, change in gait,  Psychology  No depression, anxiety, insomnia.   Vitals: Blood  pressure 126/88, pulse 76, temperature 98.3 F (36.8 C), temperature source Oral, resp. rate 18, height 5' 3.25" (1.607 m), weight 128 lb 8 oz (58.287 kg), SpO2 100 %.  Physical Exam: WD in NAD Neck  Supple NROM, without any enlargements.  Lymph Node Survey No cervical supraclavicular or inguinal adenopathy Cardiovascular  Pulse normal rate, regularity and rhythm. S1 and S2 normal.  Lungs  Clear to auscultation bilateraly, without wheezes/crackles/rhonchi. Good air movement.  Skin  No rash/lesions/breakdown  Psychiatry  Alert and oriented to person, place, and time  Abdomen  Normoactive bowel sounds, abdomen soft, non-tender and thin without evidence of hernia.  Back No CVA tenderness Genito Urinary  Vulva/vagina: Normal external female genitalia. No lesions. No discharge or bleeding. Bladder/urethra: No lesions or masses, well supported bladder Vagina: narrow introitus Cervix: Normal appearing, no lesions. Uterus: Small, mobile, no parametrial involvement or nodularity. Adnexa: no palpable masses. Rectal  deferred Extremities  No bilateral cyanosis, clubbing or edema.   Donaciano Eva, MD

## 2015-08-28 NOTE — Op Note (Signed)
OPERATIVE NOTE 08/28/15  Surgeon: Donaciano Eva   Assistants: Dr Lahoma Crocker (an MD assistant was necessary for tissue manipulation, management of robotic instrumentation, retraction and positioning due to the complexity of the case and hospital policies).   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 No evidence of cancer.  Estimated Blood Loss:  <20cc      Total IV Fluids: 700 ml         Specimens: uterus cervix bilateral tubes and ovaries, washings         Complications:  None; patient tolerated the procedure well.         Disposition: PACU - hemodynamically stable.  Procedure Details  The patient was seen in the Holding Room. The risks, benefits, complications, treatment options, and expected outcomes were discussed with the patient.  The patient concurred with the proposed plan, giving informed consent.  The site of surgery properly noted/marked. The patient was identified as Samantha Archer and the procedure verified as a Robotic-assisted hysterectomy with bilateral salpingo oophorectomy. A Time Out was held and the above information confirmed.  After induction of anesthesia, the patient was draped and prepped in the usual sterile manner. Pt was placed in supine position after anesthesia and draped and prepped in the usual sterile manner. The abdominal drape was placed after the CholoraPrep had been allowed to dry for 3 minutes.  Her arms were tucked to her side with all appropriate precautions.  The shoulders were  stabilized with padded shoulder blocks applied to the acromium processes.  The patient was placed in the semi-lithotomy position in [place].  The perineum was prepped with Betadine. The patient was then prepped. Foley catheter was placed.  A 3cm episiotomy incision was made in the perineal body to facilitate koh ring placement and specimen delivery. A sterile speculum was placed in the vagina.  The cervix was grasped with a single-tooth tenaculum and dilated with Kennon Rounds dilators.  The ZUMI uterine manipulator with a medium colpotomizer ring was placed without difficulty.  A pneum occluder balloon was placed over the manipulator.  OG tube placement was confirmed and to suction.   Next, a 5 mm skin incision was made 1 cm below the subcostal margin in the midclavicular line.  The 5 mm Optiview port and scope was used for direct entry.  Opening pressure was under 10 mm CO2.  The abdomen was insufflated and the findings were noted as above.   At this point and all points during the procedure, the patient's intra-abdominal pressure did not exceed 15 mmHg. Next, a 8 mm skin incision was made in the umbilicus and a right and left port was placed about 10 cm lateral to the robot port on the right and left side. All ports were placed under direct visualization.  The patient was placed in steep Trendelenburg.  Bowel was folded away into the upper abdomen.  The robot was docked in the normal manner.  The hysterectomy was started after the round ligament on the right side was incised and the retroperitoneum was entered and the pararectal space was developed.  The ureter was noted to be on the medial  leaf of the broad ligament.  The peritoneum above the ureter was incised and stretched and the infundibulopelvic ligament was skeletonized, cauterized and cut 5cm from the ovary.  The posterior peritoneum was taken down to the level of the KOH ring.  The anterior peritoneum was also taken down.  The bladder flap was created  to the level of the KOH ring.  The uterine artery on the right side was skeletonized, cauterized and cut in the normal manner.  A similar procedure was performed on the left.  The colpotomy was made and the uterus, cervix, bilateral ovaries and tubes were amputated and delivered through the vagina.  Pedicles were inspected and excellent hemostasis was achieved.    The colpotomy at the vaginal cuff was closed with 0-Vicryl on a CT2 needle in a running manner.  Irrigation was used and excellent hemostasis was achieved.  At this point in the procedure was completed.  Robotic instruments were removed under direct visulaization.  The robot was undocked. The skin was closed with 4-0 Vicryl in a subcuticular manner.  Dermabond was applied.  The perineal incision was closed with 3-0vicryl running suture. Sponge, lap and needle counts correct x 2.  The patient was taken to the recovery room in stable condition.  The vagina was swabbed with  minimal bleeding noted.   All instrument and needle counts were correct x  3.   The patient was transferred to the recovery room in a stable condition.  Donaciano Eva, MD

## 2015-08-28 NOTE — Progress Notes (Signed)
Pt states her intolerance to heparin was when she devoloped hives taking it several months during her second pregnancy.  She took it during her first pregnancy without complication.  Pt is concerned about her history of "blood clots."

## 2015-08-28 NOTE — Anesthesia Preprocedure Evaluation (Addendum)
Anesthesia Evaluation  Patient identified by MRN, date of birth, ID band Patient awake    Reviewed: Allergy & Precautions, H&P , NPO status , Patient's Chart, lab work & pertinent test results  History of Anesthesia Complications (+) PONV and history of anesthetic complications  Airway Mallampati: I  TM Distance: >3 FB Neck ROM: Full    Dental  (+) Teeth Intact, Dental Advisory Given   Pulmonary neg pulmonary ROS,    breath sounds clear to auscultation       Cardiovascular  Rhythm:Regular Rate:Normal     Neuro/Psych negative neurological ROS  negative psych ROS   GI/Hepatic Neg liver ROS, GERD  Medicated and Controlled,  Endo/Other  Hypothyroidism   Renal/GU negative Renal ROS     Musculoskeletal   Abdominal   Peds  Hematology   Anesthesia Other Findings   Reproductive/Obstetrics H/o breast ca                            Anesthesia Physical  Anesthesia Plan  ASA: II  Anesthesia Plan: General   Post-op Pain Management:    Induction: Intravenous  Airway Management Planned: Oral ETT  Additional Equipment:   Intra-op Plan:   Post-operative Plan: Extubation in OR  Informed Consent: I have reviewed the patients History and Physical, chart, labs and discussed the procedure including the risks, benefits and alternatives for the proposed anesthesia with the patient or authorized representative who has indicated his/her understanding and acceptance.   Dental advisory given  Plan Discussed with: Surgeon, CRNA and Anesthesiologist  Anesthesia Plan Comments:        Anesthesia Quick Evaluation

## 2015-08-28 NOTE — Anesthesia Postprocedure Evaluation (Signed)
Anesthesia Post Note  Patient: Samantha Archer  Procedure(s) Performed: Procedure(s) (LRB): XI ROBOTIC ASSISTED TOTAL HYSTERECTOMY WITH BILATERAL SALPINGO OOPHORECTOMY WITH PERITONEAL WASHINGS (Bilateral)  Patient location during evaluation: PACU Anesthesia Type: General Level of consciousness: sedated Pain management: pain level controlled Vital Signs Assessment: post-procedure vital signs reviewed and stable Respiratory status: spontaneous breathing and respiratory function stable Cardiovascular status: stable Anesthetic complications: no Comments: Initial respiratory depression resolved with narcan.    Last Vitals:  Filed Vitals:   08/28/15 1030 08/28/15 1045  BP: 122/64 124/67  Pulse: 66 69  Temp:    Resp: 11 12    Last Pain:  Filed Vitals:   08/28/15 1049  PainSc: 3                  Rajat Staver DANIEL

## 2015-08-28 NOTE — Anesthesia Procedure Notes (Signed)
Procedure Name: Intubation Date/Time: 08/28/2015 7:34 AM Performed by: Maxwell Caul Pre-anesthesia Checklist: Patient identified, Emergency Drugs available, Suction available and Patient being monitored Patient Re-evaluated:Patient Re-evaluated prior to inductionOxygen Delivery Method: Circle System Utilized Preoxygenation: Pre-oxygenation with 100% oxygen Intubation Type: IV induction Ventilation: Mask ventilation without difficulty Laryngoscope Size: Mac and 4 Grade View: Grade II Tube type: Oral Tube size: 7.0 mm Number of attempts: 1 Airway Equipment and Method: Stylet Placement Confirmation: ETT inserted through vocal cords under direct vision,  positive ETCO2 and breath sounds checked- equal and bilateral Secured at: 21 cm Tube secured with: Tape Dental Injury: Teeth and Oropharynx as per pre-operative assessment

## 2015-08-28 NOTE — Transfer of Care (Signed)
Immediate Anesthesia Transfer of Care Note  Patient: Samantha Archer  Procedure(s) Performed: Procedure(s): XI ROBOTIC ASSISTED TOTAL HYSTERECTOMY WITH BILATERAL SALPINGO OOPHORECTOMY WITH PERITONEAL WASHINGS (Bilateral)  Patient Location: PACU  Anesthesia Type:General  Level of Consciousness:  sedated, patient cooperative and responds to stimulation  Airway & Oxygen Therapy:Patient Spontanous Breathing and Patient connected to face mask oxgen  Post-op Assessment:  Report given to PACU RN and Post -op Vital signs reviewed and stable  Post vital signs:  Reviewed and stable  Last Vitals:  Filed Vitals:   08/28/15 0515  BP: 123/74  Pulse: 70  Temp: 36.7 C  Resp: 16    Complications: No apparent anesthesia complications

## 2015-08-28 NOTE — Progress Notes (Signed)
Lovenox dose given preop after pharmacy consideration and approval.

## 2015-08-29 ENCOUNTER — Telehealth: Payer: Self-pay | Admitting: Gynecologic Oncology

## 2015-08-29 DIAGNOSIS — Z4002 Encounter for prophylactic removal of ovary: Secondary | ICD-10-CM | POA: Diagnosis not present

## 2015-08-29 LAB — BASIC METABOLIC PANEL
Anion gap: 4 — ABNORMAL LOW (ref 5–15)
BUN: 7 mg/dL (ref 6–20)
CHLORIDE: 102 mmol/L (ref 101–111)
CO2: 28 mmol/L (ref 22–32)
Calcium: 8.2 mg/dL — ABNORMAL LOW (ref 8.9–10.3)
Creatinine, Ser: 0.75 mg/dL (ref 0.44–1.00)
GFR calc non Af Amer: >60 mL/min (ref >60)
Glucose, Bld: 112 mg/dL — ABNORMAL HIGH (ref 65–99)
POTASSIUM: 3.5 mmol/L (ref 3.5–5.1)
SODIUM: 134 mmol/L — AB (ref 135–145)

## 2015-08-29 LAB — CBC
HCT: 34.5 % — ABNORMAL LOW (ref 36.0–46.0)
Hemoglobin: 12.1 g/dL (ref 12.0–15.0)
MCH: 29.7 pg (ref 26.0–34.0)
MCHC: 35.1 g/dL (ref 30.0–36.0)
MCV: 84.8 fL (ref 78.0–100.0)
PLATELETS: 206 10*3/uL (ref 150–400)
RBC: 4.07 MIL/uL (ref 3.87–5.11)
RDW: 13.2 % (ref 11.5–15.5)
WBC: 9.8 10*3/uL (ref 4.0–10.5)

## 2015-08-29 MED ORDER — TRAMADOL HCL 50 MG PO TABS
50.0000 mg | ORAL_TABLET | Freq: Four times a day (QID) | ORAL | Status: DC | PRN
Start: 2015-08-29 — End: 2015-09-17

## 2015-08-29 MED ORDER — GABAPENTIN 300 MG PO CAPS: 300.0000 mg | ORAL_CAPSULE | Freq: Every day | ORAL | Status: DC

## 2015-08-29 NOTE — Progress Notes (Signed)
Erik Obey Lybrand to be D/C'd Home per MD order.  Discussed with the patient & sister in law and all questions fully answered.  VSS, Skin clean, dry and intact without evidence of skin break down, no evidence of skin tears noted. IV catheter discontinued intact. Site without signs and symptoms of complications. Dressing and pressure applied.  An After Visit Summary was printed and given to the patient. Patient received prescription.  D/c education completed with patient/family including follow up instructions, medication list, d/c activities limitations if indicated, with other d/c instructions as indicated by MD - patient able to verbalize understanding, all questions fully answered.  Patient instructed to return to ED, call 911, or call MD for any changes in condition.   Patient escorted via [place], and D/C home via private auto.  Marcy Salvo 08/29/2015 1:04 PM

## 2015-08-29 NOTE — Discharge Instructions (Signed)
08/28/2015  Return to work: 4 weeks  Activity: 1. Be up and out of the bed during the day.  Take a nap if needed.  You may walk up steps but be careful and use the hand rail.  Stair climbing will tire you more than you think, you may need to stop part way and rest.   2. No lifting or straining for 6 weeks.  3. No driving for 1 weeks.  Do Not drive if you are taking narcotic pain medicine.  4. Shower daily.  Use soap and water on your incision and pat dry; don't rub.   5. No sexual activity and nothing in the vagina for 6 weeks.  Diet: 1. Low sodium Heart Healthy Diet is recommended.  2. It is safe to use a laxative if you have difficulty moving your bowels.   Wound Care: 1. Keep clean and dry.  Shower daily.  Reasons to call the Doctor:   Fever - Oral temperature greater than 100.4 degrees Fahrenheit  Foul-smelling vaginal discharge  Difficulty urinating  Nausea and vomiting  Increased pain at the site of the incision that is unrelieved with pain medicine.  Difficulty breathing with or without chest pain  New calf pain especially if only on one side  Sudden, continuing increased vaginal bleeding with or without clots.   Follow-up: 1. See Everitt Amber in 4 weeks.  Contacts: For questions or concerns you should contact:  Dr. Everitt Amber at [phone]  or at [facility] [phone]  Gabapentin capsules or tablets What is this medicine? GABAPENTIN (GA ba pen tin) is used to control partial seizures in adults with epilepsy. It is also used to treat certain types of nerve pain. This medicine may be used for other purposes; ask your health care provider or pharmacist if you have questions. What should I tell my health care provider before I take this medicine? They need to know if you have any of these conditions: -kidney disease -suicidal thoughts, plans, or attempt; a previous suicide attempt by you or a family member -an unusual or allergic reaction to gabapentin,  other medicines, foods, dyes, or preservatives -pregnant or trying to get pregnant -breast-feeding How should I use this medicine? Take this medicine by mouth with a glass of water. Follow the directions on the prescription label. You can take it with or without food. If it upsets your stomach, take it with food.Take your medicine at regular intervals. Do not take it more often than directed. Do not stop taking except on your doctor's advice. If you are directed to break the 600 or 800 mg tablets in half as part of your dose, the extra half tablet should be used for the next dose. If you have not used the extra half tablet within 28 days, it should be thrown away. A special MedGuide will be given to you by the pharmacist with each prescription and refill. Be sure to read this information carefully each time. Talk to your pediatrician regarding the use of this medicine in children. Special care may be needed. Overdosage: If you think you have taken too much of this medicine contact a poison control center or emergency room at once. NOTE: This medicine is only for you. Do not share this medicine with others. What if I miss a dose? If you miss a dose, take it as soon as you can. If it is almost time for your next dose, take only that dose. Do not take double or extra doses. What may  interact with this medicine? Do not take this medicine with any of the following medications: -other gabapentin products This medicine may also interact with the following medications: -alcohol -antacids -antihistamines for allergy, cough and cold -certain medicines for anxiety or sleep -certain medicines for depression or psychotic disturbances -homatropine; hydrocodone -naproxen -narcotic medicines (opiates) for pain -phenothiazines like chlorpromazine, mesoridazine, prochlorperazine, thioridazine This list may not describe all possible interactions. Give your health care provider a list of all the medicines,  herbs, non-prescription drugs, or dietary supplements you use. Also tell them if you smoke, drink alcohol, or use illegal drugs. Some items may interact with your medicine. What should I watch for while using this medicine? Visit your doctor or health care professional for regular checks on your progress. You may want to keep a record at home of how you feel your condition is responding to treatment. You may want to share this information with your doctor or health care professional at each visit. You should contact your doctor or health care professional if your seizures get worse or if you have any new types of seizures. Do not stop taking this medicine or any of your seizure medicines unless instructed by your doctor or health care professional. Stopping your medicine suddenly can increase your seizures or their severity. Wear a medical identification bracelet or chain if you are taking this medicine for seizures, and carry a card that lists all your medications. You may get drowsy, dizzy, or have blurred vision. Do not drive, use machinery, or do anything that needs mental alertness until you know how this medicine affects you. To reduce dizzy or fainting spells, do not sit or stand up quickly, especially if you are an older patient. Alcohol can increase drowsiness and dizziness. Avoid alcoholic drinks. Your mouth may get dry. Chewing sugarless gum or sucking hard candy, and drinking plenty of water will help. The use of this medicine may increase the chance of suicidal thoughts or actions. Pay special attention to how you are responding while on this medicine. Any worsening of mood, or thoughts of suicide or dying should be reported to your health care professional right away. Women who become pregnant while using this medicine may enroll in the [place] Pregnancy Registry by calling [phone]. This registry collects information about the safety of antiepileptic drug use  during pregnancy. What side effects may I notice from receiving this medicine? Side effects that you should report to your doctor or health care professional as soon as possible: -allergic reactions like skin rash, itching or hives, swelling of the face, lips, or tongue -worsening of mood, thoughts or actions of suicide or dying Side effects that usually do not require medical attention (report to your doctor or health care professional if they continue or are bothersome): -constipation -difficulty walking or controlling muscle movements -dizziness -nausea -slurred speech -tiredness -tremors -weight gain This list may not describe all possible side effects. Call your doctor for medical advice about side effects. You may report side effects to FDA at 1-800-FDA-1088. Where should I keep my medicine? Keep out of reach of children. This medicine may cause accidental overdose and death if it taken by other adults, children, or pets. Mix any unused medicine with a substance like cat litter or coffee grounds. Then throw the medicine away in a sealed container like a sealed bag or a coffee can with a lid. Do not use the medicine after the expiration date. Store at room temperature between 15 and 30  degrees C (59 and 86 degrees F). NOTE: This sheet is a summary. It may not cover all possible information. If you have questions about this medicine, talk to your doctor, pharmacist, or health care provider.    2016, Elsevier/Gold Standard. (2013-05-27 15:26:50)  Tramadol tablets What is this medicine? TRAMADOL (TRA ma dole) is a pain reliever. It is used to treat moderate to severe pain in adults. This medicine may be used for other purposes; ask your health care provider or pharmacist if you have questions. What should I tell my health care provider before I take this medicine? They need to know if you have any of these conditions: -brain tumor -depression -drug abuse or addiction -head  injury -if you frequently drink alcohol containing drinks -kidney disease or trouble passing urine -liver disease -lung disease, asthma, or breathing problems -seizures or epilepsy -suicidal thoughts, plans, or attempt; a previous suicide attempt by you or a family member -an unusual or allergic reaction to tramadol, codeine, other medicines, foods, dyes, or preservatives -pregnant or trying to get pregnant -breast-feeding How should I use this medicine? Take this medicine by mouth with a full glass of water. Follow the directions on the prescription label. If the medicine upsets your stomach, take it with food or milk. Do not take more medicine than you are told to take. Talk to your pediatrician regarding the use of this medicine in children. Special care may be needed. Overdosage: If you think you have taken too much of this medicine contact a poison control center or emergency room at once. NOTE: This medicine is only for you. Do not share this medicine with others. What if I miss a dose? If you miss a dose, take it as soon as you can. If it is almost time for your next dose, take only that dose. Do not take double or extra doses. What may interact with this medicine? Do not take this medicine with any of the following medications: -MAOIs like Carbex, Eldepryl, Marplan, Nardil, and Parnate This medicine may also interact with the following medications: -alcohol or medicines that contain alcohol -antihistamines -benzodiazepines -bupropion -carbamazepine or oxcarbazepine -clozapine -cyclobenzaprine -digoxin -furazolidone -linezolid -medicines for depression, anxiety, or psychotic disturbances -medicines for migraine headache like almotriptan, eletriptan, frovatriptan, naratriptan, rizatriptan, sumatriptan, zolmitriptan -medicines for pain like pentazocine, buprenorphine, butorphanol, meperidine, nalbuphine, and propoxyphene -medicines for sleep -muscle  relaxants -naltrexone -phenobarbital -phenothiazines like perphenazine, thioridazine, chlorpromazine, mesoridazine, fluphenazine, prochlorperazine, promazine, and trifluoperazine -procarbazine -warfarin This list may not describe all possible interactions. Give your health care provider a list of all the medicines, herbs, non-prescription drugs, or dietary supplements you use. Also tell them if you smoke, drink alcohol, or use illegal drugs. Some items may interact with your medicine. What should I watch for while using this medicine? Tell your doctor or health care professional if your pain does not go away, if it gets worse, or if you have new or a different type of pain. You may develop tolerance to the medicine. Tolerance means that you will need a higher dose of the medicine for pain relief. Tolerance is normal and is expected if you take this medicine for a long time. Do not suddenly stop taking your medicine because you may develop a severe reaction. Your body becomes used to the medicine. This does NOT mean you are addicted. Addiction is a behavior related to getting and using a drug for a non-medical reason. If you have pain, you have a medical reason to take pain medicine.  Your doctor will tell you how much medicine to take. If your doctor wants you to stop the medicine, the dose will be slowly lowered over time to avoid any side effects. You may get drowsy or dizzy. Do not drive, use machinery, or do anything that needs mental alertness until you know how this medicine affects you. Do not stand or sit up quickly, especially if you are an older patient. This reduces the risk of dizzy or fainting spells. Alcohol can increase or decrease the effects of this medicine. Avoid alcoholic drinks. You may have constipation. Try to have a bowel movement at least every 2 to 3 days. If you do not have a bowel movement for 3 days, call your doctor or health care professional. Your mouth may get dry. Chewing  sugarless gum or sucking hard candy, and drinking plenty of water may help. Contact your doctor if the problem does not go away or is severe. What side effects may I notice from receiving this medicine? Side effects that you should report to your doctor or health care professional as soon as possible: -allergic reactions like skin rash, itching or hives, swelling of the face, lips, or tongue -breathing difficulties, wheezing -confusion -itching -light headedness or fainting spells -redness, blistering, peeling or loosening of the skin, including inside the mouth -seizures Side effects that usually do not require medical attention (report to your doctor or health care professional if they continue or are bothersome): -constipation -dizziness -drowsiness -headache -nausea, vomiting This list may not describe all possible side effects. Call your doctor for medical advice about side effects. You may report side effects to FDA at 1-800-FDA-1088. Where should I keep my medicine? Keep out of the reach of children. This medicine may cause accidental overdose and death if it taken by other adults, children, or pets. Mix any unused medicine with a substance like cat litter or coffee grounds. Then throw the medicine away in a sealed container like a sealed bag or a coffee can with a lid. Do not use the medicine after the expiration date. Store at room temperature between 15 and 30 degrees C (59 and 86 degrees F). NOTE: This sheet is a summary. It may not cover all possible information. If you have questions about this medicine, talk to your doctor, pharmacist, or health care provider.    2016, Elsevier/Gold Standard. (2013-05-27 15:42:09)

## 2015-08-29 NOTE — Telephone Encounter (Signed)
Patient at home and doing well since discharge.  Informed of final path results.  No concerns voiced.  Advised to call for any needs or concerns.  Follow up appt arranged.

## 2015-08-29 NOTE — Discharge Summary (Signed)
Physician Discharge Summary  Patient ID: Samantha Archer MRN: [id number] DOB/AGE: 1950-06-16 65 y.o.  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    Hospital Course: On 08/28/2015, the patient underwent the following: Procedure(s): XI ROBOTIC ASSISTED TOTAL HYSTERECTOMY WITH BILATERAL SALPINGO OOPHORECTOMY WITH PERITONEAL WASHINGS.  The postoperative course was uneventful.  She was discharged to home on postoperative day 1 tolerating a regular diet, minimal pain, passing flatus, voiding.  Consults: None  Significant Diagnostic Studies: None  Treatments: surgery: see above  Discharge Exam: Blood pressure 110/66, pulse 63, temperature 98.9 F (37.2 C), temperature source Oral, resp. rate 16, height _0  (1.6 m), weight 130 lb (58.968 kg), SpO2 96 %. General appearance: alert, cooperative and no distress Resp: clear to auscultation bilaterally Cardio: regular rate and rhythm, S1, S2 normal, no murmur, click, rub or gallop GI: soft, non-tender; bowel sounds normal; no masses,  no organomegaly Extremities: extremities normal, atraumatic, no cyanosis or edema Incision/Wound: Lap sites to the abdomen with dermabond without erythema or drainage  Disposition: 01-Home or Self Care      Discharge Instructions    Call MD for:  difficulty breathing, headache or visual disturbances    Complete by:  As directed      Call MD for:  extreme fatigue    Complete by:  As directed      Call MD for:  hives    Complete by:  As directed      Call MD for:  persistant dizziness or light-headedness    Complete by:  As directed      Call MD for:  persistant nausea and vomiting    Complete by:  As directed      Call MD for:  redness, tenderness, or signs of  infection (pain, swelling, redness, odor or green/yellow discharge around incision site)    Complete by:  As directed      Call MD for:  severe uncontrolled pain    Complete by:  As directed      Call MD for:  temperature >100.4    Complete by:  As directed      Diet - low sodium heart healthy    Complete by:  As directed      Driving Restrictions    Complete by:  As directed   No driving for 1 week.  Do not take narcotics and drive.     Increase activity slowly    Complete by:  As directed      Lifting restrictions    Complete by:  As directed   No lifting greater than 10 lbs.     Sexual Activity Restrictions    Complete by:  As directed   No sexual activity, nothing in the vagina, for 6 weeks.            Medication List    TAKE these medications        cholecalciferol 1000 units tablet  Commonly known as:  VITAMIN D  Take 1,000 Units by mouth daily.     denosumab 60 MG/ML Soln injection  Commonly known as:  PROLIA  Inject 60 mg into the skin every 6 (six) months.     docusate sodium 100 MG capsule  Commonly known as:  COLACE  Take 100 mg  by mouth daily as needed for mild constipation.     gabapentin 300 MG capsule  Commonly known as:  NEURONTIN  Take 1 capsule (300 mg total) by mouth at bedtime.     levothyroxine 75 MCG tablet  Commonly known as:  SYNTHROID, LEVOTHROID  Take 75 mcg by mouth daily.     methylcellulose oral powder  Take by mouth as needed.     multivitamin with minerals Tabs tablet  Take 1 tablet by mouth daily.     pantoprazole 40 MG tablet  Commonly known as:  PROTONIX  Take 40 mg by mouth daily as needed (For heartburn or acid refux.).     REFRESH OP  Place 1 drop into both eyes daily as needed (For dry eyes.).     saccharomyces boulardii 250 MG capsule  Commonly known as:  FLORASTOR  Take 250 mg by mouth daily.     traMADol 50 MG tablet  Commonly known as:  ULTRAM  Take 1-2 tablets (50-100 mg total) by mouth every 6 (six) hours as  needed.       Follow-up Information    Follow up with Samantha Eva, MD On 09/17/2015.   Specialty:  Obstetrics and Gynecology   Why:  For wound re-check at 3:15pm at the [facility] information:   [place] [address] [phone]       Greater than thirty minutes were spend for face to face discharge instructions and discharge orders/summary in EPIC.   Signed: Daniqua Archer DEAL 08/29/2015, 4:32 PM

## 2015-09-17 ENCOUNTER — Encounter: Payer: Self-pay | Admitting: Gynecologic Oncology

## 2015-09-17 ENCOUNTER — Ambulatory Visit: Payer: Medicare Other | Attending: Gynecologic Oncology | Admitting: Gynecologic Oncology

## 2015-09-17 VITALS — BP 102/70 | HR 76 | Temp 98.2°F | Resp 18 | Ht 63.0 in | Wt 132.8 lb

## 2015-09-17 DIAGNOSIS — Z90722 Acquired absence of ovaries, bilateral: Secondary | ICD-10-CM

## 2015-09-17 DIAGNOSIS — Z1509 Genetic susceptibility to other malignant neoplasm: Secondary | ICD-10-CM

## 2015-09-17 DIAGNOSIS — Z1502 Genetic susceptibility to malignant neoplasm of ovary: Secondary | ICD-10-CM

## 2015-09-17 DIAGNOSIS — C50919 Malignant neoplasm of unspecified site of unspecified female breast: Secondary | ICD-10-CM

## 2015-09-17 DIAGNOSIS — Z1501 Genetic susceptibility to malignant neoplasm of breast: Secondary | ICD-10-CM | POA: Diagnosis present

## 2015-09-17 DIAGNOSIS — Z90710 Acquired absence of both cervix and uterus: Secondary | ICD-10-CM | POA: Diagnosis not present

## 2015-09-17 DIAGNOSIS — R52 Pain, unspecified: Secondary | ICD-10-CM | POA: Insufficient documentation

## 2015-09-17 NOTE — Patient Instructions (Signed)
Return back to Dr Radene Knee , please call with any changes , questions or concerns , arise in the future.  Thank you

## 2015-09-18 ENCOUNTER — Encounter: Payer: Self-pay | Admitting: Gynecologic Oncology

## 2015-09-18 NOTE — Progress Notes (Signed)
POSTOPERATIVE EVALUATION  HPI:  Samantha Archer is a 65 y.o. year old No obstetric history on file. initially seen in consultation on 07/20/15 referred by Dr Radene Knee for a deleterious mutation of BRCA 2.  She then underwent a robotic assisted total hysterectomy, BSO on 07/10/17 without complications.  Her postoperative course was uncomplicated.  Her final pathology revealed benign ovaries, tubes and fibroid uterus.  She is seen today for a postoperative check and to discuss her pathology results and ongoing plan.  Since discharge from the hospital, she is feeling uncomplicated.  She has improving appetite, normal bowel and bladder function, and pain controlled with minimal PO medication. She has no other complaints today.    Review of systems: Constitutional:  She has no weight gain or weight loss. She has no fever or chills. Eyes: No blurred vision Ears, Nose, Mouth, Throat: No dizziness, headaches or changes in hearing. No mouth sores. Cardiovascular: No chest pain, palpitations or edema. Respiratory:  No shortness of breath, wheezing or cough Gastrointestinal: She has normal bowel movements without diarrhea or constipation. She denies any nausea or vomiting. She denies blood in her stool or heart burn. Genitourinary:  She denies pelvic pain, pelvic pressure or changes in her urinary function. She has no hematuria, dysuria, or incontinence. She has no irregular vaginal bleeding or vaginal discharge Musculoskeletal: Denies muscle weakness or joint pains.  Skin:  She has no skin changes, rashes or itching Neurological:  Denies dizziness or headaches. No neuropathy, no numbness or tingling. Psychiatric:  She denies depression or anxiety. Hematologic/Lymphatic:   No easy bruising or bleeding   Physical Exam: Blood pressure 102/70, pulse 76, temperature 98.2 F (36.8 C), temperature source Oral, resp. rate 18, height _0  (1.6 m), weight 132 lb 12.8 oz (60.238 kg), SpO2 97 %. General: Well  dressed, well nourished in no apparent distress.   HEENT:  Normocephalic and atraumatic, no lesions.  Extraocular muscles intact. Sclerae anicteric. Pupils equal, round, reactive. No mouth sores or ulcers. Thyroid is normal size, not nodular, midline. Skin:  No lesions or rashes. Abdomen:  Soft, nontender, nondistended.  No palpable masses.  No hepatosplenomegaly.  No ascites. Normal bowel sounds.  No hernias.  Incisions are well healed  Genitourinary: Normal EGBUS  Vaginal cuff intact.  No bleeding or discharge.  No cul de sac fullness. Extremities: No cyanosis, clubbing or edema.  No calf tenderness or erythema. No palpable cords. Psychiatric: Mood and affect are appropriate. Neurological: Awake, alert and oriented x 3. Sensation is intact, no neuropathy.  Musculoskeletal: No pain, normal strength and range of motion.  Assessment:    65 y.o. year old with BRCA 2 deleterious mutation.   S/p robotic hysterectomy, BSO on 08/28/15.   Plan: 1) Pathology reports reviewed today 2) Treatment counseling - I discussed the benign findings on pathology. No further treatment is required for this. She was given the opportunity to ask questions, which were answered to her satisfaction, and she is agreement with the above mentioned plan of care.  3)  Return to clinic on a prn basis. She will continue to see Dr Radene Knee annually for well-woman care.  Donaciano Eva, MD

## 2015-10-03 ENCOUNTER — Ambulatory Visit (INDEPENDENT_AMBULATORY_CARE_PROVIDER_SITE_OTHER): Payer: Medicare Other

## 2015-10-03 ENCOUNTER — Ambulatory Visit (INDEPENDENT_AMBULATORY_CARE_PROVIDER_SITE_OTHER): Payer: Medicare Other | Admitting: Emergency Medicine

## 2015-10-03 VITALS — HR 83 | Temp 98.2°F | Resp 16

## 2015-10-03 DIAGNOSIS — J209 Acute bronchitis, unspecified: Secondary | ICD-10-CM | POA: Diagnosis not present

## 2015-10-03 DIAGNOSIS — R059 Cough, unspecified: Secondary | ICD-10-CM

## 2015-10-03 DIAGNOSIS — R05 Cough: Secondary | ICD-10-CM | POA: Diagnosis not present

## 2015-10-03 LAB — POCT CBC
GRANULOCYTE PERCENT: 62.1 % (ref 37–80)
HEMATOCRIT: 41.2 % (ref 37.7–47.9)
Hemoglobin: 14.6 g/dL (ref 12.2–16.2)
Lymph, poc: 1.3 (ref 0.6–3.4)
MCH, POC: 30.1 pg (ref 27–31.2)
MCHC: 35.3 g/dL (ref 31.8–35.4)
MCV: 85.1 fL (ref 80–97)
MID (CBC): 0.5 (ref 0–0.9)
MPV: 6.6 fL (ref 0–99.8)
POC GRANULOCYTE: 3 (ref 2–6.9)
POC LYMPH %: 27.3 % (ref 10–50)
POC MID %: 10.6 %M (ref 0–12)
Platelet Count, POC: 234 10*3/uL (ref 142–424)
RBC: 4.84 M/uL (ref 4.04–5.48)
RDW, POC: 13.4 %
WBC: 4.8 10*3/uL (ref 4.6–10.2)

## 2015-10-03 MED ORDER — AZITHROMYCIN 250 MG PO TABS: ORAL_TABLET | ORAL | Status: DC

## 2015-10-03 MED ORDER — BENZONATATE 100 MG PO CAPS: 100.0000 mg | ORAL_CAPSULE | Freq: Three times a day (TID) | ORAL | Status: DC | PRN

## 2015-10-03 NOTE — Progress Notes (Addendum)
Patient ID: Samantha Archer, female   DOB: 04/21/1950, 65 y.o.   MRN: [id number]    By signing my name below, I, Essence Howell, attest that this documentation has been prepared under the direction and in the presence of Darlyne Russian, MD Electronically Signed: Ladene Artist, ED Scribe 10/03/2015 at 10:47 AM.  Chief Complaint:  Chief Complaint  Patient presents with  . Cough   HPI: Samantha Archer is a 65 y.o. female who reports to [facility] today complaining of gradually worsening dry cough for the past 2 days. Pt states that cough was minimally productive with green sputum 2 days ago but has been dry within the past 24 hours. She states that she recently traveled from [place] to [place] for a funeral and suspects that the pollen in [place] triggered her cough. Pt reports associated symptoms of sneezing, rhinorrhea, congestion and mild sore throat over the past 4 days. She has tried Claritin and Amoxicillin for the past 2 days without significant relief. Pt denies h/o asthma, COPD or any pulmonary related illnesses.   Past Medical History  Diagnosis Date  . GERD (gastroesophageal reflux disease)   . DVT (deep venous thrombosis) ([place]) 1974  . Cancer ([facility])     bilat mastectomies  . Hypothyroidism   . Arthritis   . Neuropathy ([place])   . PONV (postoperative nausea and vomiting)   . Breast cancer ([place])   . Osteoporosis   . History of skin cancer   . Diverticulosis   . Presence of permanent cardiac pacemaker    Past Surgical History  Procedure Laterality Date  . Hysteroscopy w/d&c  08/04/2003    also polypectomy  . Mastectomy      bilateral, with reconstruction  . Tonsillectomy    . Knee arthroplasty  6/12x2    patella-lt  . Modified radical mastectomy w/ axillary lymph node dissection  1991    right  . Modified radical mastectomy w/ axillary lymph node dissection  1999    left  . Reconstruction breast w/ tram flap  1999    bilat  . Dilation and curettage of uterus    . Knee arthroscopy   01/01/2012    Procedure: ARTHROSCOPY KNEE;  Surgeon: Ninetta Lights, MD;  Location: [facility];  Service: Orthopedics;  Laterality: Left;  left knee arthroscopy with debridement/shaving( chondroplasty) hardware removal  . Hardware removal  01/01/2012    Procedure: HARDWARE REMOVAL;  Surgeon: Ninetta Lights, MD;  Location: [facility];  Service: Orthopedics;  Laterality: Left;  left knee arthroscopy with debridement/shaving( chondroplasty) hardware removal  . Breast surgery    . Fracture surgery    . Robotic assisted total hysterectomy with bilateral salpingo oopherectomy Bilateral 08/28/2015    Procedure: XI ROBOTIC ASSISTED TOTAL HYSTERECTOMY WITH BILATERAL SALPINGO OOPHORECTOMY WITH PERITONEAL WASHINGS;  Surgeon: Everitt Amber, MD;  Location: [facility];  Service: Gynecology;  Laterality: Bilateral;   Social History   Social History  . Marital Status: Married    Spouse Name: N/A  . Number of Children: N/A  . Years of Education: N/A   Social History Main Topics  . Smoking status: Never Smoker   . Smokeless tobacco: None  . Alcohol Use: Yes     Comment: daily wine  . Drug Use: No  . Sexual Activity: Not Asked   Other Topics Concern  . None   Social History Narrative   Family History  Problem Relation Age of Onset  . Pancreatic cancer Father  died at 17  . Cancer Father   . Stomach cancer Mother     died at 27  . Diabetes Mother   . Cancer Mother   . Uterine cancer Maternal Grandmother    Allergies  Allergen Reactions  . Heparin Hives    Reaction from 30 years ago had to put her on Coumadin instead.  . Oxycodone Nausea Only  . Penicillins Other (See Comments)    Reaction unknown occurred while in college Has patient had a PCN reaction causing immediate rash, facial/tongue/throat swelling, SOB or lightheadedness with hypotension: reaction unknown Has patient had a PCN reaction causing severe rash involving mucus membranes or skin necrosis:  reaction unknown Has patient had a PCN reaction that required hospitalization no Has patient had a PCN reaction occurring within the last 10 years: no If all of the above answers are "NO", then may proceed with Cep   Prior to Admission medications   Medication Sig Start Date End Date Taking? Authorizing Provider  cholecalciferol (VITAMIN D) 1000 UNITS tablet Take 1,000 Units by mouth daily.   Yes Historical Provider, MD  denosumab (PROLIA) 60 MG/ML SOLN injection Inject 60 mg into the skin every 6 (six) months.   Yes Historical Provider, MD  docusate sodium (COLACE) 100 MG capsule Take 100 mg by mouth daily as needed for mild constipation.   Yes Historical Provider, MD  levothyroxine (SYNTHROID, LEVOTHROID) 75 MCG tablet Take 75 mcg by mouth daily.    Yes Historical Provider, MD  methylcellulose oral powder Take by mouth as needed. Reported on 09/17/2015   Yes Historical Provider, MD  Multiple Vitamin (MULTIVITAMIN WITH MINERALS) TABS tablet Take 1 tablet by mouth daily. Reported on 09/17/2015   Yes Historical Provider, MD  pantoprazole (PROTONIX) 40 MG tablet Take 40 mg by mouth daily as needed (For heartburn or acid refux.).   Yes Historical Provider, MD  Polyvinyl Alcohol-Povidone (REFRESH OP) Place 1 drop into both eyes daily as needed (For dry eyes.).   Yes Historical Provider, MD  saccharomyces boulardii (FLORASTOR) 250 MG capsule Take 250 mg by mouth daily. Reported on 10/03/2015   Yes Historical Provider, MD   ROS: The patient denies fevers, chills, night sweats, unintentional weight loss, chest pain, palpitations, wheezing, dyspnea on exertion, nausea, vomiting, abdominal pain, dysuria, hematuria, melena, numbness, weakness, or tingling.   All other systems have been reviewed and were otherwise negative with the exception of those mentioned in the HPI and as above.    PHYSICAL EXAM: Filed Vitals:   10/03/15 0928  Pulse: 83  Temp: 98.2 F (36.8 C)  Resp: 16   There is no weight on  file to calculate BMI.  General: Alert, no acute distress HEENT:  Normocephalic, atraumatic, oropharynx patent. Throat was normal.  Eye: EOM[facility], [facility] Cardiovascular: Regular rate and rhythm, no rubs murmurs or gallops. No Carotid bruits, radial pulse intact. No pedal edema.  Respiratory: Episodes of cough. Bilateral rhonchi but no rales. Good air exchange.  Abdominal: No organomegaly, abdomen is soft and non-tender, positive bowel sounds. No masses. Musculoskeletal: Gait intact. No edema, tenderness Skin: No rashes. Neurologic: Facial musculature symmetric. Psychiatric: Patient acts appropriately throughout our interaction. Lymphatic: No cervical or submandibular lymphadenopathy  LABS: Results for orders placed or performed in visit on 10/03/15  POCT CBC  Result Value Ref Range   WBC 4.8 4.6 - 10.2 K/uL   Lymph, poc 1.3 0.6 - 3.4   POC LYMPH PERCENT 27.3 10 - 50 %L   MID (cbc) 0.5  0 - 0.9   POC MID % 10.6 0 - 12 %M   POC Granulocyte 3.0 2 - 6.9   Granulocyte percent 62.1 37 - 80 %G   RBC 4.84 4.04 - 5.48 M/uL   Hemoglobin 14.6 12.2 - 16.2 g/dL   HCT, POC 41.2 37.7 - 47.9 %   MCV 85.1 80 - 97 fL   MCH, POC 30.1 27 - 31.2 pg   MCHC 35.3 31.8 - 35.4 g/dL   RDW, POC 13.4 %   Platelet Count, POC 234 142 - 424 K/uL   MPV 6.6 0 - 99.8 fL   EKG/XRAY:   Primary read interpreted by Dr. Everlene Farrier at [facility]. Dg Chest 2 View  10/03/2015  CLINICAL DATA:  Two days of cough ; history of bilateral mastectomy with reconstruction EXAM: CHEST  2 VIEW COMPARISON:  Right rib detail of November 22nd 2008. FINDINGS: The lungs are well-expanded and clear. The heart and pulmonary vascularity are normal. The mediastinum is normal in width. There is calcification in the wall of the aortic arch. There are numerous surgical clips over the anterior and lateral chest wall. No acute bony abnormality is observed. IMPRESSION: 1. There is no evidence of pneumonia nor other acute cardiopulmonary abnormality. There are no  findings suspicious for metastatic disease. 2. Aortic atherosclerosis. 3. Post mastectomy changes bilaterally. Electronically Signed   By: David  Martinique M.D.   On: 10/03/2015 10:43   ASSESSMENT/PLAN: We'll treat as acute bronchitis. She is placed on Tessalon Perles, Mucinex, and Z-Pak.I personally performed the services described in this documentation, which was scribed in my presence. The recorded information has been reviewed and is accurate.    Gross sideeffects, risk and benefits, and alternatives of medications d/w patient. Patient is aware that all medications have potential sideeffects and we are unable to predict every sideeffect or drug-drug interaction that may occur.  Arlyss Queen MD 10/03/2015 10:00 AM

## 2015-10-03 NOTE — Patient Instructions (Addendum)
   IF you received an x-ray today, you will receive an invoice from [facility]. Please contact [facility] at [phone] with questions or concerns regarding your invoice.   IF you received labwork today, you will receive an invoice from [facility]. Please contact [facility] at [phone] with questions or concerns regarding your invoice.   Our billing staff will not be able to assist you with questions regarding bills from these companies.  You will be contacted with the lab results as soon as they are available. The fastest way to get your results is to activate your My Chart account. Instructions are located on the last page of this paperwork. If you have not heard from us regarding the results in 2 weeks, please contact this office.    Acute Bronchitis Bronchitis is inflammation of the airways that extend from the windpipe into the lungs (bronchi). The inflammation often causes mucus to develop. This leads to a cough, which is the most common symptom of bronchitis.  In acute bronchitis, the condition usually develops suddenly and goes away over time, usually in a couple weeks. Smoking, allergies, and asthma can make bronchitis worse. Repeated episodes of bronchitis may cause further lung problems.  CAUSES Acute bronchitis is most often caused by the same virus that causes a cold. The virus can spread from person to person (contagious) through coughing, sneezing, and touching contaminated objects. SIGNS AND SYMPTOMS   Cough.   Fever.   Coughing up mucus.   Body aches.   Chest congestion.   Chills.   Shortness of breath.   Sore throat.  DIAGNOSIS  Acute bronchitis is usually diagnosed through a physical exam. Your health care provider will also ask you questions about your medical history. Tests, such as chest X-rays, are sometimes done to rule out other conditions.  TREATMENT  Acute bronchitis usually goes away in a  couple weeks. Oftentimes, no medical treatment is necessary. Medicines are sometimes given for relief of fever or cough. Antibiotic medicines are usually not needed but may be prescribed in certain situations. In some cases, an inhaler may be recommended to help reduce shortness of breath and control the cough. A cool mist vaporizer may also be used to help thin bronchial secretions and make it easier to clear the chest.  HOME CARE INSTRUCTIONS  Get plenty of rest.   Drink enough fluids to keep your urine clear or pale yellow (unless you have a medical condition that requires fluid restriction). Increasing fluids may help thin your respiratory secretions (sputum) and reduce chest congestion, and it will prevent dehydration.   Take medicines only as directed by your health care provider.  If you were prescribed an antibiotic medicine, finish it all even if you start to feel better.  Avoid smoking and secondhand smoke. Exposure to cigarette smoke or irritating chemicals will make bronchitis worse. If you are a smoker, consider using nicotine gum or skin patches to help control withdrawal symptoms. Quitting smoking will help your lungs heal faster.   Reduce the chances of another bout of acute bronchitis by washing your hands frequently, avoiding people with cold symptoms, and trying not to touch your hands to your mouth, nose, or eyes.   Keep all follow-up visits as directed by your health care provider.  SEEK MEDICAL CARE IF: Your symptoms do not improve after 1 week of treatment.  SEEK IMMEDIATE MEDICAL CARE IF:  You develop an increased fever or chills.   You have chest pain.     You have severe shortness of breath.  You have bloody sputum.   You develop dehydration.  You faint or repeatedly feel like you are going to pass out.  You develop repeated vomiting.  You develop a severe headache. MAKE SURE YOU:   Understand these instructions.  Will watch your  condition.  Will get help right away if you are not doing well or get worse.   This information is not intended to replace advice given to you by your health care provider. Make sure you discuss any questions you have with your health care provider.   Document Released: 05/08/2004 Document Revised: 04/21/2014 Document Reviewed: 09/21/2012 Elsevier Interactive Patient Education 2016 [employer]  

## 2015-11-14 ENCOUNTER — Ambulatory Visit (INDEPENDENT_AMBULATORY_CARE_PROVIDER_SITE_OTHER): Payer: Medicare Other | Admitting: Internal Medicine

## 2015-11-14 ENCOUNTER — Encounter: Payer: Self-pay | Admitting: Internal Medicine

## 2015-11-14 VITALS — BP 114/76 | HR 75 | Temp 98.0°F | Ht 63.25 in | Wt 132.0 lb

## 2015-11-14 DIAGNOSIS — E039 Hypothyroidism, unspecified: Secondary | ICD-10-CM | POA: Diagnosis not present

## 2015-11-14 DIAGNOSIS — R739 Hyperglycemia, unspecified: Secondary | ICD-10-CM | POA: Diagnosis not present

## 2015-11-14 DIAGNOSIS — R252 Cramp and spasm: Secondary | ICD-10-CM | POA: Diagnosis not present

## 2015-11-14 DIAGNOSIS — R202 Paresthesia of skin: Secondary | ICD-10-CM | POA: Insufficient documentation

## 2015-11-14 HISTORY — DX: Hyperglycemia, unspecified: R73.9

## 2015-11-14 LAB — VITAMIN B12: Vitamin B-12: 399 pg/mL (ref 200–1100)

## 2015-11-14 LAB — TSH: TSH: 3.21 mIU/L

## 2015-11-14 NOTE — Progress Notes (Signed)
HISTORY AND PHYSICAL  Location:    [facility]     Place of Service:   OFFICE  Extended Emergency Contact Information Primary Emergency Contact: Coffelt,Brendan Address: [place], [place] [address] [place] Phone: [phone] Relation: Spouse Secondary Emergency Contact: Charlette Caffey, [place] [address] [place] Phone: [phone] Relation: Sister  Advanced Directive information Does patient have an advance directive?: Yes, Type of Advance Directive: Healthcare Power of [place];Living will  Chief Complaint  Patient presents with  . Medical Management of Chronic Issues    New Patient. History thyroid problems. Having leg cramps    HPI:  Hypothyroidism, unspecified hypothyroidism type - the patient is having more leg cramps. She would like thyroid level checked because when low in the past, it contributed to leg cramps.  Cramp of both lower extremities - mainly in the daytime. May be related to exertion. Bilateral.  Paresthesia - patient has brother with B12 deficiency and is having problems with cognitive decline. She has had paresthesias ever since chemotherapy related to her breast cancer. -   Hyperglycemia - Noted in May 2017. Elevations were mild at 103 and 112 mg percent. Mother had diabetes. Brother is "prediabetic". She would like follow-up lab work done.    Past Medical History:  Diagnosis Date  . Arthritis   . Breast cancer ([place])   . Cancer ([place])    bilat mastectomies  . Diverticulosis   . DVT (deep venous thrombosis) ([place]) 1974  . GERD (gastroesophageal reflux disease)   . History of skin cancer   . Hypothyroidism   . Neuropathy ([place])   . Osteoporosis   . PONV (postoperative nausea and vomiting)     Past Surgical History:  Procedure Laterality Date  . BREAST SURGERY    . DILATION AND CURETTAGE OF UTERUS    . FRACTURE SURGERY Left 09/2010  . HARDWARE REMOVAL  01/01/2012   Procedure: HARDWARE REMOVAL;  Surgeon: Ninetta Lights, MD;  Location: [facility];  Service: Orthopedics;  Laterality: Left;  left knee arthroscopy with debridement/shaving( chondroplasty) hardware removal  . HYSTEROSCOPY W/D&C  08/04/2003   also polypectomy  . KNEE ARTHROPLASTY  6/12x2   patella-lt  . KNEE ARTHROSCOPY  01/01/2012   Procedure: ARTHROSCOPY KNEE;  Surgeon: Ninetta Lights, MD;  Location: [facility];  Service: Orthopedics;  Laterality: Left;  left knee arthroscopy with debridement/shaving( chondroplasty) hardware removal  . MASTECTOMY     bilateral, with reconstruction  . MODIFIED RADICAL MASTECTOMY W/ AXILLARY LYMPH NODE DISSECTION  1991   right  . MODIFIED RADICAL MASTECTOMY W/ AXILLARY LYMPH NODE DISSECTION  1999   left  . RECONSTRUCTION BREAST W/ TRAM FLAP  1999   bilat  . ROBOTIC ASSISTED TOTAL HYSTERECTOMY WITH BILATERAL SALPINGO OOPHERECTOMY Bilateral 08/28/2015   Procedure: XI ROBOTIC ASSISTED TOTAL HYSTERECTOMY WITH BILATERAL SALPINGO OOPHORECTOMY WITH PERITONEAL WASHINGS;  Surgeon: Everitt Amber, MD;  Location: [facility];  Service: Gynecology;  Laterality: Bilateral;  . TONSILLECTOMY      Patient Care Team: Estill Dooms, MD as PCP - General (Internal Medicine)  Social History   Social History  . Marital status: Married    Spouse name: Rayburn Ma   . Number of children: 2  . Years of education: 43   Occupational History  . retired     Social History Main Topics  .  Smoking status: Never Smoker  . Smokeless tobacco: Never Used  . Alcohol use 7.2 oz/week    12 Glasses of wine per week     Comment: daily wine  . Drug use: No  . Sexual activity: Yes    Birth control/ protection: Surgical   Other Topics Concern  . Not on file   Social History Narrative   Married   2 sons   Never smoked   Alcohol wine daily   Exercise 7 days a week   POA, Living Will    reports that she has never smoked. She has never used smokeless tobacco.  She reports that she drinks about 7.2 oz of alcohol per week . She reports that she does not use drugs.  Family History  Problem Relation Age of Onset  . Pancreatic cancer Father     died at 64  . Cancer Father   . Stomach cancer Mother     died at 9  . Diabetes Mother   . Cancer Mother   . Uterine cancer Maternal Grandmother    Family Status  Relation Status  . Father   . Mother   . Maternal Grandmother   . Sister Alive  . Brother Alive  . Son Alive  . Son Alive    Immunization History  Administered Date(s) Administered  . Influenza-Unspecified 01/19/2014    Allergies  Allergen Reactions  . Heparin Hives    Reaction from 30 years ago had to put her on Coumadin instead.  . Oxycodone Nausea Only  . Penicillins Other (See Comments)    Reaction unknown occurred while in college Has patient had a PCN reaction causing immediate rash, facial/tongue/throat swelling, SOB or lightheadedness with hypotension: reaction unknown Has patient had a PCN reaction causing severe rash involving mucus membranes or skin necrosis: reaction unknown Has patient had a PCN reaction that required hospitalization no Has patient had a PCN reaction occurring within the last 10 years: no If all of the above answers are "NO", then may proceed with Cep    Medications: Patient's Medications  New Prescriptions   No medications on file  Previous Medications   CHOLECALCIFEROL (VITAMIN D) 1000 UNITS TABLET    Take 1,000 Units by mouth daily.   DENOSUMAB (PROLIA) 60 MG/ML SOLN INJECTION    Inject 60 mg into the skin every 6 (six) months.   DOCUSATE SODIUM (COLACE) 100 MG CAPSULE    Take 100 mg by mouth daily as needed for mild constipation.   LEVOTHYROXINE (SYNTHROID, LEVOTHROID) 75 MCG TABLET    Take 75 mcg by mouth daily.    MULTIPLE VITAMIN (MULTIVITAMIN WITH MINERALS) TABS TABLET    Take 1 tablet by mouth daily. Reported on 09/17/2015   PANTOPRAZOLE (PROTONIX) 40 MG TABLET    Take 40 mg by mouth  daily as needed (For heartburn or acid refux.).   POLYVINYL ALCOHOL-POVIDONE (REFRESH OP)    Place 1 drop into both eyes daily as needed (For dry eyes.).  Modified Medications   No medications on file  Discontinued Medications   AZITHROMYCIN (ZITHROMAX) 250 MG TABLET    Take 2 tabs PO x 1 dose, then 1 tab PO QD x 4 days   BENZONATATE (TESSALON) 100 MG CAPSULE    Take 1-2 capsules (100-200 mg total) by mouth 3 (three) times daily as needed for cough.   METHYLCELLULOSE ORAL POWDER    Take by mouth as needed. Reported on 09/17/2015   SACCHAROMYCES BOULARDII (FLORASTOR) 250 MG CAPSULE  Take 250 mg by mouth daily. Reported on 10/03/2015    Review of Systems  Constitutional: Negative.   HENT: Negative.   Eyes:       Abnormal curve of cornea  Respiratory: Negative.   Cardiovascular:       DVT age 35, left leg  Gastrointestinal:       IBS  Endocrine:       Elevations in some glucose. Hypothyroid.  Genitourinary:       Hysterectomy May 2017.  Musculoskeletal:       Leg cramps. Left knee fracture in a fall 2012.  Skin: Negative.   Allergic/Immunologic: Negative.   Neurological: Negative.   Hematological: Negative.   Psychiatric/Behavioral: Negative.     Vitals:   11/14/15 1044  BP: 114/76  Pulse: 75  Temp: 98 F (36.7 C)  TempSrc: Oral  SpO2: 97%  Weight: 132 lb (59.9 kg)  Height: 5' 3.25" (1.607 m)   Body mass index is 23.2 kg/m. Filed Weights   11/14/15 1044  Weight: 132 lb (59.9 kg)     Physical Exam  Constitutional: She is oriented to person, place, and time. She appears well-developed and well-nourished. No distress.  HENT:  Right Ear: External ear normal.  Left Ear: External ear normal.  Nose: Nose normal.  Mouth/Throat: Oropharynx is clear and moist. No oropharyngeal exudate.  Eyes: Conjunctivae and EOM are normal. Pupils are equal, round, and reactive to light. No scleral icterus.  Neck: No JVD present. No tracheal deviation present. No thyromegaly present.   Cardiovascular: Normal rate, regular rhythm, normal heart sounds and intact distal pulses.  Exam reveals no gallop and no friction rub.   No murmur heard. Pulmonary/Chest: Effort normal. No respiratory distress. She has no wheezes. She has no rales. She exhibits no tenderness.  Right mastectomy  Abdominal: She exhibits no distension and no mass. There is no tenderness.  Musculoskeletal: Normal range of motion. She exhibits no edema or tenderness.  Lymphadenopathy:    She has no cervical adenopathy.  Neurological: She is alert and oriented to person, place, and time. No cranial nerve deficit. Coordination normal.  Intact vibratory sensation bilaterally in the feet. Intact monofilament testing bilaterally and feet.  Skin: No rash noted. She is not diaphoretic. No erythema. No pallor.  Psychiatric: She has a normal mood and affect. Her behavior is normal. Judgment and thought content normal.    Labs reviewed: Lab Summary Latest Ref Rng & Units 10/03/2015 08/29/2015 08/13/2015 03/28/2014  Hemoglobin 12.2 - 16.2 g/dL 14.6 12.1 13.8 13.6  Hematocrit 37.7 - 47.9 % 41.2 34.5(L) 40.2 41.1  White count 4.6 - 10.2 K/uL 4.8 9.8 5.4 5.8  Platelet count 150 - 400 K/uL (None) 206 249 (None)  Sodium 135 - 145 mmol/L (None) 134(L) 142 (None)  Potassium 3.5 - 5.1 mmol/L (None) 3.5 4.3 (None)  Calcium 8.9 - 10.3 mg/dL (None) 8.2(L) 9.3 (None)  Phosphorus - (None) (None) (None) (None)  Creatinine 0.44 - 1.00 mg/dL (None) 0.75 0.63 (None)  AST 15 - 41 U/L (None) (None) 19 15  Alk Phos 38 - 126 U/L (None) (None) 42 44  Bilirubin 0.3 - 1.2 mg/dL (None) (None) 0.9 0.6  Glucose 65 - 99 mg/dL (None) 112(H) 103(H) (None)  Cholesterol - (None) (None) (None) (None)  HDL cholesterol - (None) (None) (None) (None)  Triglycerides - (None) (None) (None) (None)  LDL Direct - (None) (None) (None) (None)  LDL Calc - (None) (None) (None) (None)  Total protein 6.5 - 8.1 g/dL (None) (  None) 7.1 6.8  Albumin 3.5 - 5.0 g/dL  (None) (None) 4.2 4.0  Some recent data might be hidden   Lab Results  Component Value Date   BUN 7 08/29/2015   No results found for: HGBA1C Lab Results  Component Value Date   TSH 21.410 (H) 04/02/2012   T3TOTAL 62.0 (L) 03/15/2012     Assessment/Plan 1. Cramp of both lower extremities - Basic metabolic panel  2. Hypothyroidism, unspecified hypothyroidism type - TSH  3. Paresthesia - Vitamin B12 - Methylmalonic Acid, Serum  4. Hyperglycemia - Basic metabolic panel - Hemoglobin A1c

## 2015-11-15 LAB — HEMOGLOBIN A1C
Hgb A1c MFr Bld: 5.5 % (ref <5.7)
Mean Plasma Glucose: 111 mg/dL

## 2015-11-15 LAB — BASIC METABOLIC PANEL
BUN: 16 mg/dL (ref 7–25)
CHLORIDE: 102 mmol/L (ref 98–110)
CO2: 27 mmol/L (ref 20–31)
CREATININE: 0.83 mg/dL (ref 0.50–0.99)
Calcium: 9.5 mg/dL (ref 8.6–10.4)
GLUCOSE: 90 mg/dL (ref 65–99)
Potassium: 4.1 mmol/L (ref 3.5–5.3)
Sodium: 139 mmol/L (ref 135–146)

## 2015-11-16 LAB — METHYLMALONIC ACID, SERUM: Methylmalonic Acid, Quant: 111 nmol/L (ref 87–318)

## 2016-02-12 LAB — VITAMIN D 25 HYDROXY (VIT D DEFICIENCY, FRACTURES): VIT D 25 HYDROXY: 33.93

## 2016-02-12 LAB — BASIC METABOLIC PANEL
BUN: 12 mg/dL (ref 4–21)
Creatinine: 0.7 mg/dL (ref 0.5–1.1)
Glucose: 102 mg/dL
Potassium: 4.3 mmol/L (ref 3.4–5.3)
Sodium: 138 mmol/L (ref 137–147)

## 2016-02-12 LAB — TSH: TSH: 2.16 u[IU]/mL (ref 0.41–5.90)

## 2016-02-12 LAB — HEPATIC FUNCTION PANEL
ALT: 11 U/L (ref 7–35)
AST: 16 U/L (ref 13–35)
Alkaline Phosphatase: 50 U/L (ref 25–125)
Bilirubin, Total: 0.8 mg/dL

## 2016-02-18 DIAGNOSIS — Z780 Asymptomatic menopausal state: Secondary | ICD-10-CM | POA: Insufficient documentation

## 2016-02-18 DIAGNOSIS — R7309 Other abnormal glucose: Secondary | ICD-10-CM | POA: Insufficient documentation

## 2016-04-18 DIAGNOSIS — J320 Chronic maxillary sinusitis: Secondary | ICD-10-CM | POA: Insufficient documentation

## 2016-04-18 DIAGNOSIS — H6980 Other specified disorders of Eustachian tube, unspecified ear: Secondary | ICD-10-CM | POA: Insufficient documentation

## 2016-06-13 ENCOUNTER — Telehealth: Payer: Self-pay | Admitting: Internal Medicine

## 2016-06-13 NOTE — Telephone Encounter (Signed)
left msg asking pt to confirm this [facility] appt w/ nurse. VDM (DD) °

## 2016-06-20 ENCOUNTER — Other Ambulatory Visit: Payer: Self-pay

## 2016-06-20 DIAGNOSIS — Z1509 Genetic susceptibility to other malignant neoplasm: Principal | ICD-10-CM

## 2016-06-20 DIAGNOSIS — Z1501 Genetic susceptibility to malignant neoplasm of breast: Secondary | ICD-10-CM

## 2016-06-25 ENCOUNTER — Ambulatory Visit: Payer: Medicare Other

## 2016-08-07 ENCOUNTER — Telehealth: Payer: Self-pay

## 2016-08-07 NOTE — Telephone Encounter (Signed)
Patient called back and indicated the diagnosis is BRCA II positive (certain types of breast cancer increase your risk for pancreatic cancer) and Family history of pancreatic cancer (father deceased at age 68).   Diagnosis added and form faxed back to [facility]

## 2016-08-07 NOTE — Telephone Encounter (Signed)
Message left on clinical intake voicemail:   Form was received on patient for a referral, reason for referral was left blank. Please fill in reason for referral and resubmit form to [phone]  I called [facility] to see if they know why patient requested referral for it was initiated by the patient. After holding to speak with a representative for an extended period of time I ended call and contacted patient for reason.  Left message on voicemail for patient to return call when available

## 2016-09-15 ENCOUNTER — Telehealth: Payer: Self-pay

## 2016-09-15 ENCOUNTER — Other Ambulatory Visit: Payer: Self-pay

## 2016-09-15 ENCOUNTER — Other Ambulatory Visit: Payer: Self-pay | Admitting: Internal Medicine

## 2016-09-15 ENCOUNTER — Telehealth: Payer: Self-pay | Admitting: *Deleted

## 2016-09-15 ENCOUNTER — Other Ambulatory Visit: Payer: Medicare Other

## 2016-09-15 DIAGNOSIS — E039 Hypothyroidism, unspecified: Secondary | ICD-10-CM

## 2016-09-15 DIAGNOSIS — M810 Age-related osteoporosis without current pathological fracture: Secondary | ICD-10-CM

## 2016-09-15 LAB — T4, FREE: Free T4: 1.4 ng/dL (ref 0.8–1.8)

## 2016-09-15 LAB — TSH: TSH: 1.53 mIU/L

## 2016-09-15 LAB — T3, FREE: T3, Free: 2.5 pg/mL (ref 2.3–4.2)

## 2016-09-15 NOTE — Telephone Encounter (Signed)
Patient called and stated that you were going to order labs on her. She stated that you would know because you have them with you. Stated you were going to set them up. Please Advise.

## 2016-09-15 NOTE — Telephone Encounter (Signed)
Came in today for lab, prior to Prolia injection. Per patient last Bone Density done at [facility] in [place], 07/11/15.  She will call their office to have them fax it to [place]. Patient states she received last Prolia in November and due for Prolia in May, but per [place] it is in computer as receiving Prolia 04/18/2016. Prolia Request form completed and given to Earlie Server, pending Bone Density report from [facility].

## 2016-09-15 NOTE — Telephone Encounter (Signed)
Patient notified and wants to come in today to have done. Placed on lab schedule.

## 2016-09-15 NOTE — Telephone Encounter (Signed)
Lab orders placed. Please contact her to set up a time for the lab visit.

## 2016-09-16 ENCOUNTER — Telehealth: Payer: Self-pay

## 2016-09-16 LAB — COMPREHENSIVE METABOLIC PANEL
ALK PHOS: 39 U/L (ref 33–130)
ALT: 11 U/L (ref 6–29)
AST: 16 U/L (ref 10–35)
Albumin: 4 g/dL (ref 3.6–5.1)
BUN: 10 mg/dL (ref 7–25)
CO2: 25 mmol/L (ref 20–31)
Calcium: 9.4 mg/dL (ref 8.6–10.4)
Chloride: 100 mmol/L (ref 98–110)
Creat: 0.8 mg/dL (ref 0.50–0.99)
GLUCOSE: 85 mg/dL (ref 65–99)
POTASSIUM: 4.4 mmol/L (ref 3.5–5.3)
Sodium: 136 mmol/L (ref 135–146)
Total Bilirubin: 0.7 mg/dL (ref 0.2–1.2)
Total Protein: 6.8 g/dL (ref 6.1–8.1)

## 2016-09-16 LAB — THYROID PEROXIDASE ANTIBODY: THYROID PEROXIDASE ANTIBODY: 6 [IU]/mL (ref <9)

## 2016-09-16 NOTE — Telephone Encounter (Signed)
Received Bone Density results from [place], [place].  T-score -2.6. Received office note showing last Prolia injection given on  02/25/2016  Abstracted Dexa, labs, immunizations.

## 2016-09-22 ENCOUNTER — Telehealth: Payer: Self-pay

## 2016-09-22 NOTE — Telephone Encounter (Signed)
Wanting  to know if her Prolia had come in yet. Spoke with Earlie Server, the prior authorization still in progress. Called patient to let her know, that we would call her when the prior authorization came in. She appreciated me calling her back

## 2016-09-26 NOTE — Telephone Encounter (Signed)
Called patient, left message still don't have any information from Insurance. Left message that Samantha Archer will call her when she hears from the Insurance. Gave her the office number, if she needs to call about this I will be out at [place], ask for [name].

## 2016-10-01 ENCOUNTER — Telehealth: Payer: Self-pay

## 2016-10-01 NOTE — Telephone Encounter (Signed)
Left message to call to make appt for Prolia injection. Received approval for Prolia. Call and ask for Samantha Archer.

## 2016-10-03 ENCOUNTER — Ambulatory Visit: Payer: Medicare Other

## 2016-10-03 ENCOUNTER — Ambulatory Visit (INDEPENDENT_AMBULATORY_CARE_PROVIDER_SITE_OTHER): Payer: Medicare Other

## 2016-10-03 DIAGNOSIS — M810 Age-related osteoporosis without current pathological fracture: Secondary | ICD-10-CM

## 2016-10-03 MED ORDER — DENOSUMAB 60 MG/ML ~~LOC~~ SOLN
60.0000 mg | Freq: Once | SUBCUTANEOUS | Status: AC
  Administered 2016-10-03: 60 mg via SUBCUTANEOUS

## 2016-11-17 DIAGNOSIS — E039 Hypothyroidism, unspecified: Secondary | ICD-10-CM | POA: Insufficient documentation

## 2016-11-17 DIAGNOSIS — Z800 Family history of malignant neoplasm of digestive organs: Secondary | ICD-10-CM | POA: Insufficient documentation

## 2016-11-30 ENCOUNTER — Inpatient Hospital Stay (HOSPITAL_COMMUNITY)
Admission: EM | Admit: 2016-11-30 | Discharge: 2016-12-02 | DRG: 374 | Disposition: A | Discharge status: Home / Self Care | Payer: Medicare Other | Attending: Internal Medicine | Admitting: Internal Medicine

## 2016-11-30 ENCOUNTER — Encounter (HOSPITAL_COMMUNITY): Payer: Self-pay | Admitting: Emergency Medicine

## 2016-11-30 ENCOUNTER — Emergency Department (HOSPITAL_COMMUNITY): Payer: Medicare Other

## 2016-11-30 DIAGNOSIS — I10 Essential (primary) hypertension: Secondary | ICD-10-CM | POA: Diagnosis present

## 2016-11-30 DIAGNOSIS — R739 Hyperglycemia, unspecified: Secondary | ICD-10-CM | POA: Diagnosis present

## 2016-11-30 DIAGNOSIS — K769 Liver disease, unspecified: Secondary | ICD-10-CM | POA: Diagnosis present

## 2016-11-30 DIAGNOSIS — F419 Anxiety disorder, unspecified: Secondary | ICD-10-CM | POA: Diagnosis present

## 2016-11-30 DIAGNOSIS — K668 Other specified disorders of peritoneum: Secondary | ICD-10-CM

## 2016-11-30 DIAGNOSIS — C801 Malignant (primary) neoplasm, unspecified: Secondary | ICD-10-CM

## 2016-11-30 DIAGNOSIS — C786 Secondary malignant neoplasm of retroperitoneum and peritoneum: Principal | ICD-10-CM | POA: Diagnosis present

## 2016-11-30 DIAGNOSIS — K5900 Constipation, unspecified: Secondary | ICD-10-CM | POA: Diagnosis not present

## 2016-11-30 DIAGNOSIS — R933 Abnormal findings on diagnostic imaging of other parts of digestive tract: Secondary | ICD-10-CM | POA: Diagnosis not present

## 2016-11-30 DIAGNOSIS — Z888 Allergy status to other drugs, medicaments and biological substances status: Secondary | ICD-10-CM

## 2016-11-30 DIAGNOSIS — Z86010 Personal history of colonic polyps: Secondary | ICD-10-CM | POA: Diagnosis not present

## 2016-11-30 DIAGNOSIS — M1990 Unspecified osteoarthritis, unspecified site: Secondary | ICD-10-CM | POA: Diagnosis present

## 2016-11-30 DIAGNOSIS — Z880 Allergy status to penicillin: Secondary | ICD-10-CM

## 2016-11-30 DIAGNOSIS — Z1509 Genetic susceptibility to other malignant neoplasm: Secondary | ICD-10-CM

## 2016-11-30 DIAGNOSIS — Z90710 Acquired absence of both cervix and uterus: Secondary | ICD-10-CM | POA: Diagnosis not present

## 2016-11-30 DIAGNOSIS — E039 Hypothyroidism, unspecified: Secondary | ICD-10-CM | POA: Diagnosis not present

## 2016-11-30 DIAGNOSIS — Z86718 Personal history of other venous thrombosis and embolism: Secondary | ICD-10-CM

## 2016-11-30 DIAGNOSIS — Z8049 Family history of malignant neoplasm of other genital organs: Secondary | ICD-10-CM

## 2016-11-30 DIAGNOSIS — K625 Hemorrhage of anus and rectum: Secondary | ICD-10-CM | POA: Diagnosis present

## 2016-11-30 DIAGNOSIS — K219 Gastro-esophageal reflux disease without esophagitis: Secondary | ICD-10-CM | POA: Diagnosis present

## 2016-11-30 DIAGNOSIS — K669 Disorder of peritoneum, unspecified: Secondary | ICD-10-CM | POA: Diagnosis present

## 2016-11-30 DIAGNOSIS — M810 Age-related osteoporosis without current pathological fracture: Secondary | ICD-10-CM | POA: Diagnosis present

## 2016-11-30 DIAGNOSIS — Z800 Family history of malignant neoplasm of digestive organs: Secondary | ICD-10-CM

## 2016-11-30 DIAGNOSIS — Z853 Personal history of malignant neoplasm of breast: Secondary | ICD-10-CM

## 2016-11-30 DIAGNOSIS — Z1501 Genetic susceptibility to malignant neoplasm of breast: Secondary | ICD-10-CM | POA: Diagnosis present

## 2016-11-30 DIAGNOSIS — Z9013 Acquired absence of bilateral breasts and nipples: Secondary | ICD-10-CM | POA: Diagnosis not present

## 2016-11-30 DIAGNOSIS — K5791 Diverticulosis of intestine, part unspecified, without perforation or abscess with bleeding: Secondary | ICD-10-CM | POA: Diagnosis present

## 2016-11-30 DIAGNOSIS — Z885 Allergy status to narcotic agent status: Secondary | ICD-10-CM | POA: Diagnosis not present

## 2016-11-30 DIAGNOSIS — Z833 Family history of diabetes mellitus: Secondary | ICD-10-CM | POA: Diagnosis not present

## 2016-11-30 DIAGNOSIS — C50919 Malignant neoplasm of unspecified site of unspecified female breast: Secondary | ICD-10-CM

## 2016-11-30 DIAGNOSIS — G629 Polyneuropathy, unspecified: Secondary | ICD-10-CM | POA: Diagnosis present

## 2016-11-30 LAB — CBC WITH DIFFERENTIAL/PLATELET
BASOS PCT: 0 %
Basophils Absolute: 0 10*3/uL (ref 0.0–0.1)
EOS ABS: 0.1 10*3/uL (ref 0.0–0.7)
Eosinophils Relative: 1 %
HCT: 41.5 % (ref 36.0–46.0)
HEMOGLOBIN: 14.7 g/dL (ref 12.0–15.0)
Lymphocytes Relative: 21 %
Lymphs Abs: 1.1 10*3/uL (ref 0.7–4.0)
MCH: 30 pg (ref 26.0–34.0)
MCHC: 35.4 g/dL (ref 30.0–36.0)
MCV: 84.7 fL (ref 78.0–100.0)
Monocytes Absolute: 0.5 10*3/uL (ref 0.1–1.0)
Monocytes Relative: 9 %
NEUTROS ABS: 3.6 10*3/uL (ref 1.7–7.7)
NEUTROS PCT: 69 %
Platelets: 248 10*3/uL (ref 150–400)
RBC: 4.9 MIL/uL (ref 3.87–5.11)
RDW: 13.3 % (ref 11.5–15.5)
WBC: 5.1 10*3/uL (ref 4.0–10.5)

## 2016-11-30 LAB — COMPREHENSIVE METABOLIC PANEL
ALBUMIN: 4.3 g/dL (ref 3.5–5.0)
ALK PHOS: 39 U/L (ref 38–126)
ALT: 18 U/L (ref 14–54)
AST: 23 U/L (ref 15–41)
Anion gap: 6 (ref 5–15)
BUN: 7 mg/dL (ref 6–20)
CALCIUM: 9.1 mg/dL (ref 8.9–10.3)
CO2: 28 mmol/L (ref 22–32)
CREATININE: 0.65 mg/dL (ref 0.44–1.00)
Chloride: 107 mmol/L (ref 101–111)
GFR calc Af Amer: >60 mL/min (ref >60)
GFR calc non Af Amer: >60 mL/min (ref >60)
GLUCOSE: 94 mg/dL (ref 65–99)
Potassium: 3.8 mmol/L (ref 3.5–5.1)
SODIUM: 141 mmol/L (ref 135–145)
Total Bilirubin: 1.1 mg/dL (ref 0.3–1.2)
Total Protein: 7.4 g/dL (ref 6.5–8.1)

## 2016-11-30 LAB — I-STAT CG4 LACTIC ACID, ED: Lactic Acid, Venous: 0.85 mmol/L (ref 0.5–1.9)

## 2016-11-30 LAB — SAMPLE TO BLOOD BANK

## 2016-11-30 LAB — PROTIME-INR
INR: 0.97
PROTHROMBIN TIME: 12.9 s (ref 11.4–15.2)

## 2016-11-30 LAB — APTT: aPTT: 26 seconds (ref 24–36)

## 2016-11-30 LAB — POC OCCULT BLOOD, ED: Fecal Occult Bld: NEGATIVE

## 2016-11-30 MED ORDER — SODIUM CHLORIDE 0.9 % IV BOLUS (SEPSIS)
1000.0000 mL | Freq: Once | INTRAVENOUS | Status: AC
  Administered 2016-11-30: 1000 mL via INTRAVENOUS

## 2016-11-30 MED ORDER — IOPAMIDOL (ISOVUE-300) INJECTION 61%
INTRAVENOUS | Status: AC
  Administered 2016-11-30: 100 mL via INTRAVENOUS
  Filled 2016-11-30: qty 100

## 2016-11-30 MED ORDER — HYDROCODONE-ACETAMINOPHEN 5-325 MG PO TABS: 1.0000 | ORAL_TABLET | ORAL | Status: DC | PRN

## 2016-11-30 MED ORDER — ONDANSETRON HCL 4 MG PO TABS: 4.0000 mg | ORAL_TABLET | Freq: Four times a day (QID) | ORAL | Status: DC | PRN

## 2016-11-30 MED ORDER — FENTANYL CITRATE (PF) 100 MCG/2ML IJ SOLN: 50.0000 ug | INTRAMUSCULAR | Status: DC | PRN

## 2016-11-30 MED ORDER — SODIUM CHLORIDE 0.9 % IV SOLN
INTRAVENOUS | Status: DC
  Administered 2016-12-01: via INTRAVENOUS

## 2016-11-30 MED ORDER — LEVOTHYROXINE SODIUM 50 MCG PO TABS
75.0000 ug | ORAL_TABLET | Freq: Every day | ORAL | Status: DC
  Administered 2016-12-01 – 2016-12-02 (×2): 75 ug via ORAL
  Filled 2016-11-30 (×2): qty 1

## 2016-11-30 MED ORDER — ACETAMINOPHEN 650 MG RE SUPP
650.0000 mg | Freq: Four times a day (QID) | RECTAL | Status: DC | PRN
Start: 2016-11-30 — End: 2016-12-02

## 2016-11-30 MED ORDER — DOCUSATE SODIUM 100 MG PO CAPS: 100.0000 mg | ORAL_CAPSULE | Freq: Every day | ORAL | Status: DC | PRN

## 2016-11-30 MED ORDER — LEVOTHYROXINE SODIUM 75 MCG PO TABS: 75.0000 ug | ORAL_TABLET | Freq: Every day | ORAL | Status: DC

## 2016-11-30 MED ORDER — HYDRALAZINE HCL 20 MG/ML IJ SOLN
10.0000 mg | INTRAMUSCULAR | Status: DC | PRN
  Filled 2016-11-30: qty 0.5

## 2016-11-30 MED ORDER — VITAMIN D3 25 MCG (1000 UNIT) PO TABS
1000.0000 [IU] | ORAL_TABLET | Freq: Every day | ORAL | Status: DC
  Administered 2016-12-01 – 2016-12-02 (×2): 1000 [IU] via ORAL
  Filled 2016-11-30 (×2): qty 1

## 2016-11-30 MED ORDER — POLYVINYL ALCOHOL 1.4 % OP SOLN
2.0000 [drp] | Freq: Three times a day (TID) | OPHTHALMIC | Status: DC | PRN
  Filled 2016-11-30: qty 15

## 2016-11-30 MED ORDER — LORAZEPAM 0.5 MG PO TABS: 0.5000 mg | ORAL_TABLET | Freq: Four times a day (QID) | ORAL | Status: DC | PRN

## 2016-11-30 MED ORDER — PANTOPRAZOLE SODIUM 40 MG PO TBEC
40.0000 mg | DELAYED_RELEASE_TABLET | Freq: Two times a day (BID) | ORAL | Status: DC
  Administered 2016-11-30 – 2016-12-02 (×4): 40 mg via ORAL
  Filled 2016-11-30 (×4): qty 1

## 2016-11-30 MED ORDER — ONDANSETRON HCL 4 MG/2ML IJ SOLN: 4.0000 mg | Freq: Four times a day (QID) | INTRAMUSCULAR | Status: DC | PRN

## 2016-11-30 MED ORDER — ACETAMINOPHEN 325 MG PO TABS
650.0000 mg | ORAL_TABLET | Freq: Four times a day (QID) | ORAL | Status: DC | PRN
  Administered 2016-11-30: 650 mg via ORAL
  Filled 2016-11-30: qty 2

## 2016-11-30 MED ORDER — ADULT MULTIVITAMIN W/MINERALS CH
1.0000 | ORAL_TABLET | Freq: Every day | ORAL | Status: DC
  Administered 2016-12-01 – 2016-12-02 (×2): 1 via ORAL
  Filled 2016-11-30 (×2): qty 1

## 2016-11-30 NOTE — H&P (Signed)
History and Physical    Samantha Archer:[id number] DOB: 1951-02-16 DOA: 11/30/2016  PCP: Estill Dooms, MD   Patient coming from: Home  Chief Complaint: Bright red blood per rectum   HPI: Samantha Archer is a 66 y.o. female with medical history significant for breast cancer status post bilateral mastectomies in the 1990s, hypothyroidism, and GERD, now presenting to the emergency department for evaluation of bright red blood per rectum. Patient reports suffering from mild intermittent abdominal discomfort that she attributes to biliary colic, but had otherwise been doing fairly well until this morning when she had 2 episodes of diarrhea with bright red blood. He denies any abdominal pain per se, but notes some vague discomfort throughout the abdomen, but particularly in the left lower quadrant. She denies recent fevers or chills, denies night sweats, and denies significant weight loss. She has never experienced similar symptoms previously.   ED Course: Upon arrival to the ED, patient is found to be afebrile, saturating well on room air, and with vitals stable. Chemistry panel is unremarkable and CBC is within normal limits. Lactic acid is reassuring at 0.85. CT of the abdomen and pelvis was obtained and concerning for peritoneal thickening and enhancement with small ascites concerning for a meal carcinomatosis. Also noted on the CT is vague hypodensities in the liver concerning for possible metastatic disease. Patient was given a liter of normal saline in the emergency department and gastroenterology was consulted by the ED physician. Gastroenterologist recommended a medical admission for further evaluation of the patient's bleeding. Oncology was also consulted by the ED physician and indicated that workup could be done inpatient or outpatient. Patient remained stable in the emergency department and will be admitted to the medical surgical unit for ongoing evaluation and management of painless  rectal bleeding and CT findings concerning for possible peritoneal carcinomatosis.  Review of Systems:  All other systems reviewed and apart from HPI, are negative.  Past Medical History:  Diagnosis Date  . Arthritis   . Breast cancer ([place])   . Cancer ([place])    bilat mastectomies  . Diverticulosis   . DVT (deep venous thrombosis) ([place]) 1974  . GERD (gastroesophageal reflux disease)   . History of skin cancer   . Hyperglycemia 11/14/2015  . Hypothyroidism   . Neuropathy   . Osteoporosis   . PONV (postoperative nausea and vomiting)     Past Surgical History:  Procedure Laterality Date  . BREAST SURGERY    . DILATION AND CURETTAGE OF UTERUS    . FRACTURE SURGERY Left 09/2010  . HARDWARE REMOVAL  01/01/2012   Procedure: HARDWARE REMOVAL;  Surgeon: Ninetta Lights, MD;  Location: [facility];  Service: Orthopedics;  Laterality: Left;  left knee arthroscopy with debridement/shaving( chondroplasty) hardware removal  . HYSTEROSCOPY W/D&C  08/04/2003   also polypectomy  . KNEE ARTHROPLASTY  6/12x2   patella-lt  . KNEE ARTHROSCOPY  01/01/2012   Procedure: ARTHROSCOPY KNEE;  Surgeon: Ninetta Lights, MD;  Location: [facility];  Service: Orthopedics;  Laterality: Left;  left knee arthroscopy with debridement/shaving( chondroplasty) hardware removal  . MASTECTOMY     bilateral, with reconstruction  . MODIFIED RADICAL MASTECTOMY W/ AXILLARY LYMPH NODE DISSECTION  1991   right  . MODIFIED RADICAL MASTECTOMY W/ AXILLARY LYMPH NODE DISSECTION  1999   left  . RECONSTRUCTION BREAST W/ TRAM FLAP  1999   bilat  . ROBOTIC ASSISTED TOTAL HYSTERECTOMY WITH BILATERAL SALPINGO OOPHERECTOMY Bilateral 08/28/2015  Procedure: XI ROBOTIC ASSISTED TOTAL HYSTERECTOMY WITH BILATERAL SALPINGO OOPHORECTOMY WITH PERITONEAL WASHINGS;  Surgeon: Everitt Amber, MD;  Location: [facility];  Service: Gynecology;  Laterality: Bilateral;  . TONSILLECTOMY       reports that she has never smoked. She  has never used smokeless tobacco. She reports that she drinks about 7.2 oz of alcohol per week . She reports that she does not use drugs.  Allergies  Allergen Reactions  . Heparin Hives    Reaction from 30 years ago had to put her on Coumadin instead.  . Oxycodone Nausea Only  . Penicillins Other (See Comments)    Reaction unknown occurred while in college Has patient had a PCN reaction causing immediate rash, facial/tongue/throat swelling, SOB or lightheadedness with hypotension: reaction unknown Has patient had a PCN reaction causing severe rash involving mucus membranes or skin necrosis: reaction unknown Has patient had a PCN reaction that required hospitalization no Has patient had a PCN reaction occurring within the last 10 years: no If all of the above answers are "NO", then may proceed with Cep    Family History  Problem Relation Age of Onset  . Pancreatic cancer Father        died at 82  . Cancer Father   . Stomach cancer Mother        died at 79  . Diabetes Mother   . Cancer Mother   . Uterine cancer Maternal Grandmother   . Cancer Paternal Grandfather   . Memory loss Brother        B12 deficiency     Prior to Admission medications   Medication Sig Start Date End Date Taking? Authorizing Provider  cholecalciferol (VITAMIN D) 1000 UNITS tablet Take 1,000 Units by mouth daily.    [provider]  denosumab (PROLIA) 60 MG/ML SOLN injection Inject 60 mg into the skin every 6 (six) months.    [provider]  docusate sodium (COLACE) 100 MG capsule Take 100 mg by mouth daily as needed for mild constipation.    [provider]  levothyroxine (SYNTHROID, LEVOTHROID) 75 MCG tablet Take 75 mcg by mouth daily.     [provider]  Multiple Vitamin (MULTIVITAMIN WITH MINERALS) TABS tablet Take 1 tablet by mouth daily. Reported on 09/17/2015    [provider]  pantoprazole (PROTONIX) 40 MG tablet Take 40 mg by mouth daily as needed (For  heartburn or acid refux.).    [provider]  Polyvinyl Alcohol-Povidone (REFRESH OP) Place 1 drop into both eyes daily as needed (For dry eyes.).    [provider]    Physical Exam: Vitals:   11/30/16 1224 11/30/16 1628  BP: (!) 181/98 (!) 146/87  Pulse: 78 67  Resp: 14 15  Temp: 98.3 F (36.8 C)   SpO2: 100% 100%      Constitutional: NAD, calm, anxious Eyes: PERTLA, lids and conjunctivae normal ENMT: Mucous membranes are moist. Posterior pharynx clear of any exudate or lesions.   Neck: normal, supple, no masses, no thyromegaly Respiratory: clear to auscultation bilaterally, no wheezing, no crackles. Normal respiratory effort.    Cardiovascular: S1 & S2 heard, regular rate and rhythm. No extremity edema. No significant JVD. Abdomen: No distension, soft, mild tenderness in LLQ without rebound pain or guarding. No masses palpated. Bowel sounds active.  Musculoskeletal: no clubbing / cyanosis. No joint deformity upper and lower extremities.    Skin: no significant rashes, lesions, ulcers. Warm, dry, well-perfused. Neurologic: CN 2-12 grossly intact. Sensation  intact, DTR normal. Strength 5/5 in all 4 limbs.  Psychiatric: Alert and oriented x 3. Anxious, but very pleasant and cooperative.     Labs on Admission: I have personally reviewed following labs and imaging studies  CBC:  Recent Labs Lab 11/30/16 1513  WBC 5.1  NEUTROABS 3.6  HGB 14.7  HCT 41.5  MCV 84.7  PLT 063   Basic Metabolic Panel:  Recent Labs Lab 11/30/16 1513  NA 141  K 3.8  CL 107  CO2 28  GLUCOSE 94  BUN 7  CREATININE 0.65  CALCIUM 9.1   GFR: CrCl cannot be calculated (Unknown ideal weight.). Liver Function Tests:  Recent Labs Lab 11/30/16 1513  AST 23  ALT 18  ALKPHOS 39  BILITOT 1.1  PROT 7.4  ALBUMIN 4.3   No results for input(s): LIPASE, AMYLASE in the last 168 hours. No results for input(s): AMMONIA in the last 168 hours. Coagulation Profile:  Recent  Labs Lab 11/30/16 1513  INR 0.97   Cardiac Enzymes: No results for input(s): CKTOTAL, CKMB, CKMBINDEX, TROPONINI in the last 168 hours. BNP (last 3 results) No results for input(s): PROBNP in the last 8760 hours. HbA1C: No results for input(s): HGBA1C in the last 72 hours. CBG: No results for input(s): GLUCAP in the last 168 hours. Lipid Profile: No results for input(s): CHOL, HDL, LDLCALC, TRIG, CHOLHDL, LDLDIRECT in the last 72 hours. Thyroid Function Tests: No results for input(s): TSH, T4TOTAL, FREET4, T3FREE, THYROIDAB in the last 72 hours. Anemia Panel: No results for input(s): VITAMINB12, FOLATE, FERRITIN, TIBC, IRON, RETICCTPCT in the last 72 hours. Urine analysis:    Component Value Date/Time   COLORURINE YELLOW 08/13/2015 0840   APPEARANCEUR CLEAR 08/13/2015 0840   LABSPEC 1.020 08/13/2015 0840   PHURINE 5.5 08/13/2015 0840   GLUCOSEU NEGATIVE 08/13/2015 0840   HGBUR NEGATIVE 08/13/2015 0840   BILIRUBINUR NEGATIVE 08/13/2015 0840   KETONESUR NEGATIVE 08/13/2015 0840   PROTEINUR NEGATIVE 08/13/2015 0840   NITRITE NEGATIVE 08/13/2015 0840   LEUKOCYTESUR NEGATIVE 08/13/2015 0840   Sepsis Labs: '@LABRCNTIP'$ (procalcitonin:4,lacticidven:4) )No results found for this or any previous visit (from the past 240 hour(s)).   Radiological Exams on Admission: Ct Abdomen Pelvis W Contrast  Result Date: 11/30/2016 CLINICAL DATA:  Recent diarrhea and bright red blood per rectum, initial encounter EXAM: CT ABDOMEN AND PELVIS WITH CONTRAST TECHNIQUE: Multidetector CT imaging of the abdomen and pelvis was performed using the standard protocol following bolus administration of intravenous contrast. CONTRAST:  <See Chart> ISOVUE-300 IOPAMIDOL (ISOVUE-300) INJECTION 61% COMPARISON:  09/20/2013 FINDINGS: Lower chest: Left lung base is within normal limits. A small right pleural effusion is noted with some associated middle lobe and lower lobe atelectatic change. Hepatobiliary: Scattered  hypodensities are noted throughout the liver. One of these present in the posterior aspect of the right lobe of the liver was present on the prior exam and consistent with a cyst. A few vague areas are noted both in the left and right lobes of the liver on image number 17 of series 2 and also image number 23 of series 2. Although these may represent cysts or small hemangiomas the possibility of more aggressive process would deserve consideration. The gallbladder is within normal limits. Pancreas: Unremarkable. No pancreatic ductal dilatation or surrounding inflammatory changes. Spleen: Normal in size without focal abnormality. Adrenals/Urinary Tract: The adrenal glands are within normal limits. The kidneys are well visualized bilaterally with mild extrarenal pelves. No true obstructive changes are seen. Bladder is decompressed. Stomach/Bowel: Diverticular change  of the colon is noted without evidence of diverticulitis. No obstructive changes are seen. The appendix is not well visualized. No inflammatory changes to suggest appendicitis are noted. Vascular/Lymphatic: No significant vascular abnormality is noted. No significant lymphadenopathy is seen. Reproductive: The uterus has been surgically removed consistent with the patient's given clinical history. Other: There is mild ascites identified as well as significant soft tissue density throughout the omentum. This along with some peritoneal enhancement along the right pelvic wall is consistent with peritoneal carcinomatosis. Musculoskeletal: Degenerative changes of the lumbar spine are noted. No acute bony abnormality is seen. 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 Vague hypodensities within the liver as described above suspicious for metastatic disease. Nonemergent further workup is recommended as indicated. Postsurgical changes. Electronically Signed   By: Inez Catalina M.D.   On: 11/30/2016 17:12    EKG: Not performed.   Assessment/Plan  1. Rectal bleeding  - Pt presents following two episodes of diarrhea with bright red blood earlier today  - There has not been any fever/chills or abdominal pain, and no N/V  - Never experienced this previously  - Hgb is wnl, exam is benign, no tachycardia or hypotension  - CT demonstrates peritoneal thickening concerning for carcinomatosis, but no obvious etiology for the bleeding  - GI was consulted by ED physician and advised admission for further inpatient monitoring  - Plan to obtain type & screen, trend Hgb, continue supportive care   2. Peritoneal thickening  - Pt noted to have peritoneal thickening and enhancement on CT, concerning for carcinomatosis  - She has hx of bilateral breast cancer and BRCA+, prompting hysterectomy with oophorectomy in May 2017  - Path from the uterus, ovaries, and fallopian tubes was negative for malignancy  - Oncology was consulted by the ED physician and much appreciated   3. Hx breast cancer, BRCA+  - Status post bilateral mastectomies in 1990's  - Concern for possible peritoneal carcinomatosis as above    4. Hypothyroidism  - Continue Synthroid    5. Hypertension  - Secondary to anxiety regarding the current clinical situation  - Start prn Ativan, prn hydralazine IVP's     DVT prophylaxis: SCD's  Code Status: Full  Family Communication: Discussed with patient Disposition Plan: Admit to med-surg Consults called: Oncology, Gastroenterology Admission status: Inpatient    Vianne Bulls, MD [facility] Pager [phone]  If 7PM-7AM, please contact night-coverage [email or web address] Password [facility]  11/30/2016, 6:42 PM

## 2016-11-30 NOTE — ED Triage Notes (Signed)
Pt states she had 2 episodes of diarrhea since this morning and had blood in stools. Pt reports bright red blood, has hx of hemorrhoids and feels pressure in rectum. Pt states she did "gallbladder cleanse" last night and patient thinks it is related to diarrhea.

## 2016-11-30 NOTE — ED Notes (Signed)
Patient transported to CT 

## 2016-11-30 NOTE — ED Provider Notes (Signed)
[facility] DEPT Provider Note   CSN: [id number] Arrival date & time: 11/30/16  1123     History   Chief Complaint Chief Complaint  Patient presents with  . Blood In Stools    HPI Samantha Archer is a 66 y.o. female.  HPI Samantha Archer is a 67 y.o. female with history of breast cancer in remission, history of DVT in 1974, history of diverticulosis, hypothyroidism, presents to emergency department complaining of rectal bleeding and abdominal pain. Patient states that she did a "gallbladder cleanse" which involves drinking warm salt water, grapefruit juice, lemon juice, states she did this on Friday. She states she felt well yesterday. This morning she woke up with some discomfort and left abdomen and had 2 episodes of diarrhea with bright red blood. She states "entire, was full of blood." She denies history of the same but states she had a colonoscopy one year ago and was told she had diverticulosis. She does report some discomfort in her rectum as well as states she may have hemorrhoids. She denies any fever or chills. No nausea or vomiting. No fever. She called her primary care doctor who is in [place] where she resides, and was told to come to the emergency department.  Past Medical History:  Diagnosis Date  . Arthritis   . Breast cancer ([facility])   . Cancer ([place])    bilat mastectomies  . Diverticulosis   . DVT (deep venous thrombosis) ([facility]) 1974  . GERD (gastroesophageal reflux disease)   . History of skin cancer   . Hyperglycemia 11/14/2015  . Hypothyroidism   . Neuropathy   . Osteoporosis   . PONV (postoperative nausea and vomiting)     Patient Active Problem List   Diagnosis Date Noted  . Senile osteoporosis 09/15/2016  . Leg cramp 11/14/2015  . Paresthesia 11/14/2015  . Hyperglycemia 11/14/2015  . BRCA2 positive 08/28/2015  . 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 GERD (gastroesophageal reflux disease) 03/15/2012  . Hypothyroidism 03/05/2009  . DVT 03/05/2009    . HEMORRHOIDS 03/05/2009  . DIVERTICULOSIS, COLON 03/05/2009  . IRRITABLE BOWEL SYNDROME 03/05/2009  . EPIGASTRIC TENDERNESS 03/05/2009  . BREAST CANCER, PERSONAL HX 03/05/2009  . COLONIC POLYPS, ADENOMATOUS, HX OF 03/05/2009    Past Surgical History:  Procedure Laterality Date  . BREAST SURGERY    . DILATION AND CURETTAGE OF UTERUS    . FRACTURE SURGERY Left 09/2010  . HARDWARE REMOVAL  01/01/2012   Procedure: HARDWARE REMOVAL;  Surgeon: Ninetta Lights, MD;  Location: [facility];  Service: Orthopedics;  Laterality: Left;  left knee arthroscopy with debridement/shaving( chondroplasty) hardware removal  . HYSTEROSCOPY W/D&C  08/04/2003   also polypectomy  . KNEE ARTHROPLASTY  6/12x2   patella-lt  . KNEE ARTHROSCOPY  01/01/2012   Procedure: ARTHROSCOPY KNEE;  Surgeon: Ninetta Lights, MD;  Location: [facility];  Service: Orthopedics;  Laterality: Left;  left knee arthroscopy with debridement/shaving( chondroplasty) hardware removal  . MASTECTOMY     bilateral, with reconstruction  . MODIFIED RADICAL MASTECTOMY W/ AXILLARY LYMPH NODE DISSECTION  1991   right  . MODIFIED RADICAL MASTECTOMY W/ AXILLARY LYMPH NODE DISSECTION  1999   left  . RECONSTRUCTION BREAST W/ TRAM FLAP  1999   bilat  . ROBOTIC ASSISTED TOTAL HYSTERECTOMY WITH BILATERAL SALPINGO OOPHERECTOMY Bilateral 08/28/2015   Procedure: XI ROBOTIC ASSISTED TOTAL HYSTERECTOMY WITH BILATERAL SALPINGO OOPHORECTOMY WITH PERITONEAL WASHINGS;  Surgeon: Everitt Amber, MD;  Location: [facility];  Service: Gynecology;  Laterality: Bilateral;  . TONSILLECTOMY      OB History    No data available       Home Medications    Prior to Admission medications   Medication Sig Start Date End Date Taking? Authorizing Provider  cholecalciferol (VITAMIN D) 1000 UNITS tablet Take 1,000 Units by mouth daily.    [provider]  denosumab (PROLIA) 60 MG/ML SOLN injection Inject 60 mg into the skin every 6 (six)  months.    [provider]  docusate sodium (COLACE) 100 MG capsule Take 100 mg by mouth daily as needed for mild constipation.    [provider]  levothyroxine (SYNTHROID, LEVOTHROID) 75 MCG tablet Take 75 mcg by mouth daily.     [provider]  Multiple Vitamin (MULTIVITAMIN WITH MINERALS) TABS tablet Take 1 tablet by mouth daily. Reported on 09/17/2015    [provider]  pantoprazole (PROTONIX) 40 MG tablet Take 40 mg by mouth daily as needed (For heartburn or acid refux.).    [provider]  Polyvinyl Alcohol-Povidone (REFRESH OP) Place 1 drop into both eyes daily as needed (For dry eyes.).    [provider]    Family History Family History  Problem Relation Age of Onset  . Pancreatic cancer Father        died at 39  . Cancer Father   . Stomach cancer Mother        died at 41  . Diabetes Mother   . Cancer Mother   . Uterine cancer Maternal Grandmother   . Cancer Paternal Grandfather   . Memory loss Brother        B12 deficiency    Social History Social History  Substance Use Topics  . Smoking status: Never Smoker  . Smokeless tobacco: Never Used  . Alcohol use 7.2 oz/week    12 Glasses of wine per week     Comment: daily wine     Allergies   Heparin; Oxycodone; and Penicillins   Review of Systems Review of Systems  Constitutional: Negative for chills and fever.  Respiratory: Negative for cough, chest tightness and shortness of breath.   Cardiovascular: Negative for chest pain, palpitations and leg swelling.  Gastrointestinal: Positive for abdominal pain, blood in stool and diarrhea. Negative for nausea and vomiting.  Genitourinary: Negative for dysuria, flank pain, pelvic pain, vaginal bleeding, vaginal discharge and vaginal pain.  Musculoskeletal: Negative for arthralgias, myalgias, neck pain and neck stiffness.  Skin: Negative for rash.  Neurological: Negative for dizziness, weakness and headaches.  All  other systems reviewed and are negative.    Physical Exam Updated Vital Signs BP (!) 181/98   Pulse 78   Temp 98.3 F (36.8 C)   Resp 14   SpO2 100%   Physical Exam  Constitutional: She appears well-developed and well-nourished. No distress.  HENT:  Head: Normocephalic.  Eyes: Conjunctivae are normal.  Neck: Neck supple.  Cardiovascular: Normal rate, regular rhythm and normal heart sounds.   Pulmonary/Chest: Effort normal and breath sounds normal. No respiratory distress. She has no wheezes. She has no rales.  Abdominal: Soft. Bowel sounds are normal. She exhibits no distension. There is tenderness. There is no rebound.  Fullness palpated in the left upper quadrant that is tender.  Genitourinary:  Genitourinary Comments: Small, nonthrombosed, nonbleeding hemorrhoids. Stool is brown. No rectal tenderness.  Musculoskeletal: She exhibits no edema.  Neurological: She is alert.  Skin: Skin is warm and dry.  Psychiatric: She has a normal  mood and affect. Her behavior is normal.  Nursing note and vitals reviewed.    ED Treatments / Results  Labs (all labs ordered are listed, but only abnormal results are displayed) Labs Reviewed  CBC WITH DIFFERENTIAL/PLATELET  COMPREHENSIVE METABOLIC PANEL  PROTIME-INR  APTT  BASIC METABOLIC PANEL  CBC  I-STAT CG4 LACTIC ACID, ED  POC OCCULT BLOOD, ED  SAMPLE TO BLOOD BANK  TYPE AND SCREEN    EKG  EKG Interpretation None       Radiology Ct Abdomen Pelvis W Contrast  Result Date: 11/30/2016 CLINICAL DATA:  Recent diarrhea and bright red blood per rectum, initial encounter EXAM: CT ABDOMEN AND PELVIS WITH CONTRAST TECHNIQUE: Multidetector CT imaging of the abdomen and pelvis was performed using the standard protocol following bolus administration of intravenous contrast. CONTRAST:  <See Chart> ISOVUE-300 IOPAMIDOL (ISOVUE-300) INJECTION 61% COMPARISON:  09/20/2013 FINDINGS: Lower chest: Left lung base is within normal limits. A  small right pleural effusion is noted with some associated middle lobe and lower lobe atelectatic change. Hepatobiliary: Scattered hypodensities are noted throughout the liver. One of these present in the posterior aspect of the right lobe of the liver was present on the prior exam and consistent with a cyst. A few vague areas are noted both in the left and right lobes of the liver on image number 17 of series 2 and also image number 23 of series 2. Although these may represent cysts or small hemangiomas the possibility of more aggressive process would deserve consideration. The gallbladder is within normal limits. Pancreas: Unremarkable. No pancreatic ductal dilatation or surrounding inflammatory changes. Spleen: Normal in size without focal abnormality. Adrenals/Urinary Tract: The adrenal glands are within normal limits. The kidneys are well visualized bilaterally with mild extrarenal pelves. No true obstructive changes are seen. Bladder is decompressed. Stomach/Bowel: Diverticular change of the colon is noted without evidence of diverticulitis. No obstructive changes are seen. The appendix is not well visualized. No inflammatory changes to suggest appendicitis are noted. Vascular/Lymphatic: No significant vascular abnormality is noted. No significant lymphadenopathy is seen. Reproductive: The uterus has been surgically removed consistent with the patient's given clinical history. Other: There is mild ascites identified as well as significant soft tissue density throughout the omentum. This along with some peritoneal enhancement along the right pelvic wall is consistent with peritoneal carcinomatosis. Musculoskeletal: Degenerative changes of the lumbar spine are noted. No acute bony abnormality is seen. 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 Vague hypodensities within the liver as described above suspicious for  metastatic disease. Nonemergent further workup is recommended as indicated. Postsurgical changes. Electronically Signed   By: Inez Catalina M.D.   On: 11/30/2016 17:12    Procedures Procedures (including critical care time)  Medications Ordered in ED Medications - No data to display   Initial Impression / Assessment and Plan / ED Course  I have reviewed the triage vital signs and the nursing notes.  Pertinent labs & imaging results that were available during my care of the patient were reviewed by me and considered in my medical decision making (see chart for details).     Pt with rectal bleeding onset this morning. Reports some abdominal discomfort. Pt is hypertensive, otehrwise normal labs. Will get labs and CT abd pelvis. Patient is in no acute distress. Does not want any pain medications. Will give IV fluids. Pending lab results and CT.   Patient's blood work unremarkable, hemoglobin is normal.  Vital signs are stable, although patient is hypertensive. Pending CT.  6:10 PM CT showing peritoneal carcinomatosis and liver lesions. Patient has had liver lesions in the past, unsure of this are new. I discussed results with patient. Spoke with Dr. Collene Mares with gastroenterology, who recommended admission. She explained that even the patient's labs and vital signs are normal, with the findings on the CT she could perhaps potential intestinal lesion that is bleeding.she asked for hospitalist to call in the morning for gastroenterology consult. I also spoke with Dr. Melburn Hake with oncology 2 advised the patient to be worked up as an inpatient or outpatient and if she is admitted to call them in the morning as well.I will call hospitalist.   Spoke with hospitalist, they agree to admit pt. Advised to admitting physician and will need to call in AM to GI and oncology for consult.   Vitals:   11/30/16 1628 11/30/16 1906 11/30/16 2130 11/30/16 2142  BP: (!) 146/87 (!) 149/104 (!) 166/94 (!) 166/94    Pulse: 67 84  75  Resp: '15 15  18  '$ Temp:    98.2 F (36.8 C)  TempSrc:    Oral  SpO2: 100% 96%  95%     Final Clinical Impressions(s) / ED Diagnoses   Final diagnoses:  Rectal bleeding  Peritoneal carcinomatosis [facility])    New Prescriptions New Prescriptions   No medications on file     Jeannett Senior, Hershal Coria 11/30/16 2148    Sherwood Gambler, MD 12/02/16 1228

## 2016-12-01 DIAGNOSIS — K625 Hemorrhage of anus and rectum: Secondary | ICD-10-CM

## 2016-12-01 DIAGNOSIS — R933 Abnormal findings on diagnostic imaging of other parts of digestive tract: Secondary | ICD-10-CM

## 2016-12-01 LAB — TYPE AND SCREEN
ABO/RH(D): O NEG
Antibody Screen: NEGATIVE

## 2016-12-01 LAB — CBC
HEMATOCRIT: 38.9 % (ref 36.0–46.0)
HEMATOCRIT: 42.4 % (ref 36.0–46.0)
Hemoglobin: 13.9 g/dL (ref 12.0–15.0)
Hemoglobin: 15.2 g/dL — ABNORMAL HIGH (ref 12.0–15.0)
MCH: 30 pg (ref 26.0–34.0)
MCH: 30.2 pg (ref 26.0–34.0)
MCHC: 35.7 g/dL (ref 30.0–36.0)
MCHC: 35.8 g/dL (ref 30.0–36.0)
MCV: 83.8 fL (ref 78.0–100.0)
MCV: 84.3 fL (ref 78.0–100.0)
PLATELETS: 254 10*3/uL (ref 150–400)
Platelets: 235 10*3/uL (ref 150–400)
RBC: 4.64 MIL/uL (ref 3.87–5.11)
RBC: 5.03 MIL/uL (ref 3.87–5.11)
RDW: 13.2 % (ref 11.5–15.5)
RDW: 13.2 % (ref 11.5–15.5)
WBC: 5.5 10*3/uL (ref 4.0–10.5)
WBC: 5.6 10*3/uL (ref 4.0–10.5)

## 2016-12-01 LAB — BASIC METABOLIC PANEL
Anion gap: 7 (ref 5–15)
BUN: 5 mg/dL — AB (ref 6–20)
CO2: 24 mmol/L (ref 22–32)
Calcium: 8.2 mg/dL — ABNORMAL LOW (ref 8.9–10.3)
Chloride: 107 mmol/L (ref 101–111)
Creatinine, Ser: 0.69 mg/dL (ref 0.44–1.00)
GFR calc Af Amer: >60 mL/min (ref >60)
GLUCOSE: 92 mg/dL (ref 65–99)
POTASSIUM: 3.5 mmol/L (ref 3.5–5.1)
Sodium: 138 mmol/L (ref 135–145)

## 2016-12-01 MED ORDER — SENNOSIDES-DOCUSATE SODIUM 8.6-50 MG PO TABS: 1.0000 | ORAL_TABLET | Freq: Two times a day (BID) | ORAL | Status: DC

## 2016-12-01 MED ORDER — POLYETHYLENE GLYCOL 3350 17 G PO PACK
17.0000 g | PACK | Freq: Every day | ORAL | Status: DC
  Administered 2016-12-01: 17 g via ORAL
  Filled 2016-12-01: qty 1

## 2016-12-01 NOTE — Consult Note (Signed)
Referring Provider: Dr. Posey Pronto, [facility] Primary Care Physician:  Estill Dooms, MD Primary Gastroenterologist:  Dr. Olevia Perches, patient requesting Dr. Fuller Plan  Reason for Consultation:  Rectal bleeding  HPI: Samantha Archer is a 66 y.o. 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  She presented to the emergency department for evaluation of bright red blood per rectum. Patient reports suffering from mild intermittent abdominal discomfort that she attributes to biliary colic for which she does an occasional "cleanse" with some special grapefruit drink, which she actually did last week.  Otherwise she had been doing fairly well until the morning of ER visit when she had 2 episodes of diarrhea with bright red blood. He denies any abdominal pain per se, but notes some vague discomfort throughout the abdomen, but particularly just left and inferior to the umbilicus that she was contributing to musculoskeletal cause. She denies recent fevers or chills, denies night sweats, and denies significant weight loss. She has never experienced similar symptoms of bleeding previously.  CBC has remained normal and she actually had a small BM without blood this AM.  Was heme negative in the ED.    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  Vague hypodensities within the liver as described above suspicious for metastatic disease. Nonemergent further workup is recommended as indicated.  Postsurgical changes.  Also noted to have diverticulosis.   She says that they are permanent residents of [place] so has been receiving care there, but will be in this area for  the next several months.  Also now being followed by [facility] for BRCA 2 with EUS alternating MRI/MRCP.  Had EUS in [place] about 6 months ago and is scheduled to have MRI/MRCP through [facility] in the near future.  I reviewed her most recent endoscopic records from [facility], which are noted below and will be sent for scanning.  Colonoscopy in March 2017 showed mild erythema throughout the rectum and colon most pronounced in the left colon, diverticulosis, polyps, hemorrhoids. Pathology revealed normal mucosa from terminal ileal biopsies, ascending colon biopsies, descending colon biopsies, and sigmoid biopsies. She had 1 hyperplastic polyp removed from the rectum and one adenomatous polyp removed from the proximal descending colon. Biopsies from the transverse colon and the rectum showed mild acute and chronic inflammation.  EGD also performed March 2017 that showed reflux, white plaques within the esophagus, inflammation at the GE junction, gastritis, duodenitis. Esophageal brushings showed positive for yeast and pseudohyphae consistent with Candida. Small bowel biopsies showed preserved villous architecture negative for celiac sprue.  Gastric biopsies showed mild acute and chronic inflammation without H. pylori. GE junction biopsies at 41 cm showed mild acute and chronic inflammation with areas suspicious for intestinal metaplasia and Barrett's esophagus.  Past Medical History:  Diagnosis Date  . Arthritis   . Breast cancer ([place])   . Cancer ([place])    bilat mastectomies  . Diverticulosis   . DVT (deep venous thrombosis) ([place]) 1974  . GERD (gastroesophageal reflux disease)   . History of skin cancer   . Hyperglycemia 11/14/2015  . Hypothyroidism   . Neuropathy   . Osteoporosis   . PONV (postoperative nausea and vomiting)     Past Surgical History:  Procedure Laterality  Date  . BREAST SURGERY    . DILATION AND CURETTAGE OF UTERUS    . FRACTURE SURGERY Left 09/2010  . HARDWARE REMOVAL  01/01/2012   Procedure:  HARDWARE REMOVAL;  Surgeon: Ninetta Lights, MD;  Location: [place];  Service: Orthopedics;  Laterality: Left;  left knee arthroscopy with debridement/shaving( chondroplasty) hardware removal  . HYSTEROSCOPY W/D&C  08/04/2003   also polypectomy  . KNEE ARTHROPLASTY  6/12x2   patella-lt  . KNEE ARTHROSCOPY  01/01/2012   Procedure: ARTHROSCOPY KNEE;  Surgeon: Ninetta Lights, MD;  Location: [place];  Service: Orthopedics;  Laterality: Left;  left knee arthroscopy with debridement/shaving( chondroplasty) hardware removal  . MASTECTOMY     bilateral, with reconstruction  . MODIFIED RADICAL MASTECTOMY W/ AXILLARY LYMPH NODE DISSECTION  1991   right  . MODIFIED RADICAL MASTECTOMY W/ AXILLARY LYMPH NODE DISSECTION  1999   left  . RECONSTRUCTION BREAST W/ TRAM FLAP  1999   bilat  . ROBOTIC ASSISTED TOTAL HYSTERECTOMY WITH BILATERAL SALPINGO OOPHERECTOMY Bilateral 08/28/2015   Procedure: XI ROBOTIC ASSISTED TOTAL HYSTERECTOMY WITH BILATERAL SALPINGO OOPHORECTOMY WITH PERITONEAL WASHINGS;  Surgeon: Everitt Amber, MD;  Location: [facility];  Service: Gynecology;  Laterality: Bilateral;  . TONSILLECTOMY      Prior to Admission medications   Medication Sig Start Date End Date Taking? Authorizing Provider  cholecalciferol (VITAMIN D) 1000 UNITS tablet Take 1,000 Units by mouth daily.   Yes [provider]  denosumab (PROLIA) 60 MG/ML SOLN injection Inject 60 mg into the skin every 6 (six) months.   Yes [provider]  docusate sodium (COLACE) 100 MG capsule Take 100 mg by mouth daily as needed for mild constipation.   Yes [provider]  hyoscyamine (ANASPAZ) 0.125 MG TBDP disintergrating tablet Place 0.125 mg under the tongue daily as needed for cramping.   Yes [provider]  ibuprofen (ADVIL,MOTRIN) 200 MG tablet Take 200 mg by mouth every 6 (six) hours as needed for mild pain.   Yes [provider]  levothyroxine (SYNTHROID,  LEVOTHROID) 75 MCG tablet Take 75 mcg by mouth daily.    Yes [provider]  Multiple Vitamin (MULTIVITAMIN WITH MINERALS) TABS tablet Take 1 tablet by mouth daily. Reported on 09/17/2015   Yes [provider]  pantoprazole (PROTONIX) 40 MG tablet Take 40 mg by mouth daily as needed (For heartburn or acid refux.).   Yes [provider]  Polyvinyl Alcohol-Povidone (REFRESH OP) Place 1 drop into both eyes daily as needed (For dry eyes.).   Yes [provider]    Current Facility-Administered Medications  Medication Dose Route Frequency Provider Last Rate Last Dose  . acetaminophen (TYLENOL) tablet 650 mg  650 mg Oral Q6H PRN Opyd, Ilene Qua, MD   650 mg at 11/30/16 2329   Or  . acetaminophen (TYLENOL) suppository 650 mg  650 mg Rectal Q6H PRN Opyd, Ilene Qua, MD      . cholecalciferol (VITAMIN D) tablet 1,000 Units  1,000 Units Oral Daily Opyd, Ilene Qua, MD   1,000 Units at 12/01/16 0950  . docusate sodium (COLACE) capsule 100 mg  100 mg Oral Daily PRN Opyd, Ilene Qua, MD      . fentaNYL (SUBLIMAZE) injection 50 mcg  50 mcg Intravenous Q2H PRN Opyd, Ilene Qua, MD      . hydrALAZINE (APRESOLINE) injection 10 mg  10 mg Intravenous Q4H PRN Opyd, Ilene Qua, MD      . HYDROcodone-acetaminophen (NORCO/VICODIN)  5-325 MG per tablet 1-2 tablet  1-2 tablet Oral Q4H PRN Opyd, Ilene Qua, MD      . levothyroxine (SYNTHROID, LEVOTHROID) tablet 75 mcg  75 mcg Oral QAC breakfast Opyd, Ilene Qua, MD   75 mcg at 12/01/16 0849  . LORazepam (ATIVAN) tablet 0.5-1 mg  0.5-1 mg Oral Q6H PRN Opyd, Ilene Qua, MD      . multivitamin with minerals tablet 1 tablet  1 tablet Oral Daily Opyd, Ilene Qua, MD   1 tablet at 12/01/16 0950  . ondansetron (ZOFRAN) tablet 4 mg  4 mg Oral Q6H PRN Opyd, Ilene Qua, MD       Or  . ondansetron (ZOFRAN) injection 4 mg  4 mg Intravenous Q6H PRN Opyd, Ilene Qua, MD      . pantoprazole (PROTONIX) EC tablet 40 mg  40 mg Oral BID Opyd, Ilene Qua, MD   40 mg  at 12/01/16 0950  . polyvinyl alcohol (LIQUIFILM TEARS) 1.4 % ophthalmic solution 2 drop  2 drop Both Eyes TID PRN Opyd, Ilene Qua, MD        Allergies as of 11/30/2016 - Review Complete 11/30/2016  Allergen Reaction Noted  . Heparin Hives 08/08/2015  . Oxycodone Nausea Only 08/28/2015  . Penicillins Other (See Comments) 03/05/2009    Family History  Problem Relation Age of Onset  . Pancreatic cancer Father        died at 77  . Cancer Father   . Stomach cancer Mother        died at 66  . Diabetes Mother   . Cancer Mother   . Uterine cancer Maternal Grandmother   . Cancer Paternal Grandfather   . Memory loss Brother        B12 deficiency    Social History   Social History  . Marital status: Married    Spouse name: Rayburn Ma   . Number of children: 2  . Years of education: 85   Occupational History  . retired     Social History Main Topics  . Smoking status: Never Smoker  . Smokeless tobacco: Never Used  . Alcohol use 7.2 oz/week    12 Glasses of wine per week     Comment: daily wine  . Drug use: No  . Sexual activity: Yes    Birth control/ protection: Surgical   Other Topics Concern  . Not on file   Social History Narrative   Married   2 sons   Never smoked   Alcohol wine daily   Exercise 7 days a week   POA, Living Will    Review of Systems: ROS is O/W negative except as mentioned in HPI.  Physical Exam: Vital signs in last 24 hours: Temp:  [98.2 F (36.8 C)-98.9 F (37.2 C)] 98.3 F (36.8 C) (08/20 0639) Pulse Rate:  [67-84] 70 (08/20 0639) Resp:  [14-20] 20 (08/20 0639) BP: (121-181)/(67-104) 121/67 (08/20 0639) SpO2:  [95 %-100 %] 99 % (08/20 0639) Weight:  [129 lb 6.6 oz (58.7 kg)] 129 lb 6.6 oz (58.7 kg) (08/19 2315) Last BM Date: 11/30/16 General:  Alert, Well-developed, well-nourished, pleasant and cooperative in NAD Head:  Normocephalic and atraumatic. Eyes:  Sclera clear, no icterus.  Conjunctiva pink. Ears:  Normal auditory  acuity. Mouth:  No deformity or lesions.   Lungs:  Clear throughout to auscultation.  No wheezes, crackles, or rhonchi.  No increased WOB. Heart:  Regular rate and rhythm; no murmurs, clicks, rubs, or gallops. Abdomen:  Soft,  non-distended.  BS present.  Scars noted from previous surgeries.   Rectal:  Deferred  Msk:  Symmetrical without gross deformities. Pulses:  Normal pulses noted. Extremities:  Without clubbing or edema. Neurologic:  Alert and oriented x 4;  grossly normal neurologically. Skin:  Intact without significant lesions or rashes. Psych:  Alert and cooperative. Normal mood and affect.  Lab Results:  Recent Labs  11/30/16 1513 12/01/16 0347  WBC 5.1 5.5  HGB 14.7 13.9  HCT 41.5 38.9  PLT 248 235   BMET  Recent Labs  11/30/16 1513 12/01/16 0347  NA 141 138  K 3.8 3.5  CL 107 107  CO2 28 24  GLUCOSE 94 92  BUN 7 5*  CREATININE 0.65 0.69  CALCIUM 9.1 8.2*   LFT  Recent Labs  11/30/16 1513  PROT 7.4  ALBUMIN 4.3  AST 23  ALT 18  ALKPHOS 39  BILITOT 1.1   PT/INR  Recent Labs  11/30/16 1513  LABPROT 12.9  INR 0.97   Studies/Results: Ct Abdomen Pelvis W Contrast  Result Date: 11/30/2016 CLINICAL DATA:  Recent diarrhea and bright red blood per rectum, initial encounter EXAM: CT ABDOMEN AND PELVIS WITH CONTRAST TECHNIQUE: Multidetector CT imaging of the abdomen and pelvis was performed using the standard protocol following bolus administration of intravenous contrast. CONTRAST:  <See Chart> ISOVUE-300 IOPAMIDOL (ISOVUE-300) INJECTION 61% COMPARISON:  09/20/2013 FINDINGS: Lower chest: Left lung base is within normal limits. A small right pleural effusion is noted with some associated middle lobe and lower lobe atelectatic change. Hepatobiliary: Scattered hypodensities are noted throughout the liver. One of these present in the posterior aspect of the right lobe of the liver was present on the prior exam and consistent with a cyst. A few vague areas are  noted both in the left and right lobes of the liver on image number 17 of series 2 and also image number 23 of series 2. Although these may represent cysts or small hemangiomas the possibility of more aggressive process would deserve consideration. The gallbladder is within normal limits. Pancreas: Unremarkable. No pancreatic ductal dilatation or surrounding inflammatory changes. Spleen: Normal in size without focal abnormality. Adrenals/Urinary Tract: The adrenal glands are within normal limits. The kidneys are well visualized bilaterally with mild extrarenal pelves. No true obstructive changes are seen. Bladder is decompressed. Stomach/Bowel: Diverticular change of the colon is noted without evidence of diverticulitis. No obstructive changes are seen. The appendix is not well visualized. No inflammatory changes to suggest appendicitis are noted. Vascular/Lymphatic: No significant vascular abnormality is noted. No significant lymphadenopathy is seen. Reproductive: The uterus has been surgically removed consistent with the patient's given clinical history. Other: There is mild ascites identified as well as significant soft tissue density throughout the omentum. This along with some peritoneal enhancement along the right pelvic wall is consistent with peritoneal carcinomatosis. Musculoskeletal: Degenerative changes of the lumbar spine are noted. No acute bony abnormality is seen. 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 Vague hypodensities within the liver as described above suspicious for metastatic disease. Nonemergent further workup is recommended as indicated. Postsurgical changes. Electronically Signed   By: Inez Catalina M.D.   On: 11/30/2016 17:12   IMPRESSION:  *Rectal bleeding:  Bright red blood per rectum.  Low volume, BM this AM with no blood and was hemoccult negative in the ED.  Diverticulosis on CT scan  and colonoscopy in [place] last year.  Hgb is stable/normal.  Suspect low grade diverticular bleed or even hemorrhoidal.   *History of BRCA 2 breast cancer bilaterally for which she follows with [facility] for MRCP/EUS monitoring.  Had B/L mastectomy in 1990's and then hysterectomy with oophorectomy in May 2017.  Oncology has been consulted. *Liver lesions and concern for carcinomatosis on CT scan:  IR consulted for biopsy.  PLAN: *Will advance to a soft diet.  If no further bleeding then would be ok for discharge from a GI standpoint.    ZEHR, JESSICA D.  12/01/2016, 11:02 AM  Pager number [phone]   ________________________________________________________________________  Velora Heckler GI MD note:  I personally examined the patient, reviewed the data and agree with the assessment and plan described above.  She has abnormal peritoneum on CT (vs CT 2015) with concern for peritoneal carcinomatosis. Her GI bleeding is probably not related since she had colonoscopy 2017 and the bleeding has stopped already and Hb has remained normal (low volume bleeding, perhaps minor diverticular bleed, mild colitis).  She is getting CT guided peritoneal biopsy tomorrow and she will likely be safe for d/c afterwards.  I don't think she'll need colonoscopy unless she resumes bleeding OR the path from peritoneum biopsies is not diagnostic.      Owens Loffler, MD [facility] Pager [phone]

## 2016-12-01 NOTE — Consult Note (Signed)
Chief Complaint: Patient was seen in consultation today for liver lesions, concern for carcinomatosis.  Referring Physician(s):  Dr. Owens Loffler  Supervising Physician: Markus Daft  Patient Status: [facility] - In-pt  History of Present Illness: Samantha Archer is a 66 y.o. female with past medical history of arthritis, DVT, diverticulosis, GERD, hypothyroidism, and breast cancer who presented to the [facility] ED today with bright red blood per rectum.   CT Abd/Pelvis showed: -There are changes consistent with peritoneal carcinomatosis with peritoneal thickening and enhancement as well as mild ascites. -Vague hypodensities within the liver as described above suspicious for metastatic disease. Nonemergent further workup is recommended as indicated. -Postsurgical changes.  IR consulted for biopsy at the request of Dr. Ardis Hughs.   Past Medical History:  Diagnosis Date  . Arthritis   . Breast cancer ([place])   . Cancer ([place])    bilat mastectomies  . Diverticulosis   . DVT (deep venous thrombosis) ([place]) 1974  . GERD (gastroesophageal reflux disease)   . History of skin cancer   . Hyperglycemia 11/14/2015  . Hypothyroidism   . Neuropathy   . Osteoporosis   . PONV (postoperative nausea and vomiting)     Past Surgical History:  Procedure Laterality Date  . BREAST SURGERY    . DILATION AND CURETTAGE OF UTERUS    . FRACTURE SURGERY Left 09/2010  . HARDWARE REMOVAL  01/01/2012   Procedure: HARDWARE REMOVAL;  Surgeon: Ninetta Lights, MD;  Location: [place];  Service: Orthopedics;  Laterality: Left;  left knee arthroscopy with debridement/shaving( chondroplasty) hardware removal  . HYSTEROSCOPY W/D&C  08/04/2003   also polypectomy  . KNEE ARTHROPLASTY  6/12x2   patella-lt  . KNEE ARTHROSCOPY  01/01/2012   Procedure: ARTHROSCOPY KNEE;  Surgeon: Ninetta Lights, MD;  Location: [place];  Service: Orthopedics;  Laterality: Left;  left knee arthroscopy with  debridement/shaving( chondroplasty) hardware removal  . MASTECTOMY     bilateral, with reconstruction  . MODIFIED RADICAL MASTECTOMY W/ AXILLARY LYMPH NODE DISSECTION  1991   right  . MODIFIED RADICAL MASTECTOMY W/ AXILLARY LYMPH NODE DISSECTION  1999   left  . RECONSTRUCTION BREAST W/ TRAM FLAP  1999   bilat  . ROBOTIC ASSISTED TOTAL HYSTERECTOMY WITH BILATERAL SALPINGO OOPHERECTOMY Bilateral 08/28/2015   Procedure: XI ROBOTIC ASSISTED TOTAL HYSTERECTOMY WITH BILATERAL SALPINGO OOPHORECTOMY WITH PERITONEAL WASHINGS;  Surgeon: Everitt Amber, MD;  Location: [facility];  Service: Gynecology;  Laterality: Bilateral;  . TONSILLECTOMY      Allergies: Heparin; Oxycodone; and Penicillins  Medications: Prior to Admission medications   Medication Sig Start Date End Date Taking? Authorizing Provider  cholecalciferol (VITAMIN D) 1000 UNITS tablet Take 1,000 Units by mouth daily.   Yes [provider]  denosumab (PROLIA) 60 MG/ML SOLN injection Inject 60 mg into the skin every 6 (six) months.   Yes [provider]  docusate sodium (COLACE) 100 MG capsule Take 100 mg by mouth daily as needed for mild constipation.   Yes [provider]  hyoscyamine (ANASPAZ) 0.125 MG TBDP disintergrating tablet Place 0.125 mg under the tongue daily as needed for cramping.   Yes [provider]  ibuprofen (ADVIL,MOTRIN) 200 MG tablet Take 200 mg by mouth every 6 (six) hours as needed for mild pain.   Yes [provider]  levothyroxine (SYNTHROID, LEVOTHROID) 75 MCG tablet Take 75 mcg by mouth daily.    Yes [provider]  Multiple Vitamin (MULTIVITAMIN WITH MINERALS) TABS tablet  Take 1 tablet by mouth daily. Reported on 09/17/2015   Yes [provider]  pantoprazole (PROTONIX) 40 MG tablet Take 40 mg by mouth daily as needed (For heartburn or acid refux.).   Yes [provider]  Polyvinyl Alcohol-Povidone (REFRESH OP) Place 1 drop into both eyes daily as  needed (For dry eyes.).   Yes [provider]     Family History  Problem Relation Age of Onset  . Pancreatic cancer Father        died at 27  . Cancer Father   . Stomach cancer Mother        died at 24  . Diabetes Mother   . Cancer Mother   . Uterine cancer Maternal Grandmother   . Cancer Paternal Grandfather   . Memory loss Brother        B12 deficiency    Social History   Social History  . Marital status: Married    Spouse name: Rayburn Ma   . Number of children: 2  . Years of education: 79   Occupational History  . retired     Social History Main Topics  . Smoking status: Never Smoker  . Smokeless tobacco: Never Used  . Alcohol use 7.2 oz/week    12 Glasses of wine per week     Comment: daily wine  . Drug use: No  . Sexual activity: Yes    Birth control/ protection: Surgical   Other Topics Concern  . None   Social History Narrative   Married   2 sons   Never smoked   Alcohol wine daily   Exercise 7 days a week   POA, Living Will    Review of Systems  Constitutional: Negative for fatigue and fever.  Respiratory: Negative for cough and shortness of breath.   Cardiovascular: Negative for chest pain.  Gastrointestinal: Positive for blood in stool. Negative for abdominal pain.  Psychiatric/Behavioral: Negative for behavioral problems and confusion.    Vital Signs: BP (!) 169/93 (BP Location: Left Arm)   Pulse 85   Temp 98.6 F (37 C) (Oral)   Resp 18   Ht 5\' 3"  (1.6 m)   Wt 129 lb 6.6 oz (58.7 kg)   SpO2 97%   BMI 22.92 kg/m   Physical Exam  Constitutional: She is oriented to person, place, and time. She appears well-developed.  Cardiovascular: Normal rate, regular rhythm and normal heart sounds.   Pulmonary/Chest: Effort normal and breath sounds normal. No respiratory distress.  Abdominal: There is no tenderness.  Neurological: She is alert and oriented to person, place, and time.  Skin: Skin is warm and dry.  Psychiatric: She has a  normal mood and affect. Her behavior is normal. Judgment and thought content normal.  Nursing note and vitals reviewed.   Mallampati Score:  MD Evaluation Airway: WNL Heart: WNL Abdomen: WNL Chest/ Lungs: WNL ASA  Classification: 3 Mallampati/Airway Score: Two  Imaging: Ct Abdomen Pelvis W Contrast  Result Date: 11/30/2016 CLINICAL DATA:  Recent diarrhea and bright red blood per rectum, initial encounter EXAM: CT ABDOMEN AND PELVIS WITH CONTRAST TECHNIQUE: Multidetector CT imaging of the abdomen and pelvis was performed using the standard protocol following bolus administration of intravenous contrast. CONTRAST:  <See Chart> ISOVUE-300 IOPAMIDOL (ISOVUE-300) INJECTION 61% COMPARISON:  09/20/2013 FINDINGS: Lower chest: Left lung base is within normal limits. A small right pleural effusion is noted with some associated middle lobe and lower lobe atelectatic change. Hepatobiliary: Scattered hypodensities are noted throughout the liver.  One of these present in the posterior aspect of the right lobe of the liver was present on the prior exam and consistent with a cyst. A few vague areas are noted both in the left and right lobes of the liver on image number 17 of series 2 and also image number 23 of series 2. Although these may represent cysts or small hemangiomas the possibility of more aggressive process would deserve consideration. The gallbladder is within normal limits. Pancreas: Unremarkable. No pancreatic ductal dilatation or surrounding inflammatory changes. Spleen: Normal in size without focal abnormality. Adrenals/Urinary Tract: The adrenal glands are within normal limits. The kidneys are well visualized bilaterally with mild extrarenal pelves. No true obstructive changes are seen. Bladder is decompressed. Stomach/Bowel: Diverticular change of the colon is noted without evidence of diverticulitis. No obstructive changes are seen. The appendix is not well visualized. No inflammatory changes to  suggest appendicitis are noted. Vascular/Lymphatic: No significant vascular abnormality is noted. No significant lymphadenopathy is seen. Reproductive: The uterus has been surgically removed consistent with the patient's given clinical history. Other: There is mild ascites identified as well as significant soft tissue density throughout the omentum. This along with some peritoneal enhancement along the right pelvic wall is consistent with peritoneal carcinomatosis. Musculoskeletal: Degenerative changes of the lumbar spine are noted. No acute bony abnormality is seen. 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 Vague hypodensities within the liver as described above suspicious for metastatic disease. Nonemergent further workup is recommended as indicated. Postsurgical changes. Electronically Signed   By: Inez Catalina M.D.   On: 11/30/2016 17:12    Labs:  CBC:  Recent Labs  11/30/16 1513 12/01/16 0347 12/01/16 1427  WBC 5.1 5.5 5.6  HGB 14.7 13.9 15.2*  HCT 41.5 38.9 42.4  PLT 248 235 254    COAGS:  Recent Labs  11/30/16 1513  INR 0.97  APTT 26    BMP:  Recent Labs  02/12/16 09/15/16 1614 11/30/16 1513 12/01/16 0347  NA 138 136 141 138  K 4.3 4.4 3.8 3.5  CL  --  100 107 107  CO2  --  25 28 24   GLUCOSE  --  85 94 92  BUN 12 10 7  5*  CALCIUM  --  9.4 9.1 8.2*  CREATININE 0.7 0.80 0.65 0.69  GFRNONAA  --   --  >60 >60  GFRAA  --   --  >60 >60    LIVER FUNCTION TESTS:  Recent Labs  02/12/16 09/15/16 1614 11/30/16 1513  BILITOT  --  0.7 1.1  AST 16 16 23   ALT 11 11 18   ALKPHOS 50 39 39  PROT  --  6.8 7.4  ALBUMIN  --  4.0 4.3    TUMOR MARKERS: No results for input(s): AFPTM, CEA, CA199, CHROMGRNA in the last 8760 hours.  Assessment and Plan: Liver lesion, possible carcinomatosis Patient with past medical history of breast cancer presents with complaint of bright  red blood in her stool this AM. CT Abd shows liver lesions as well as peritoneal thickening concerning for carcinomatosis.   IR consulted for biopsy at the request of Dr. Ardis Hughs. Case reviewed by Dr. Anselm Pancoast who approves patient for peritoneal biopsy.  Patient did have clear liquids earlier today.  Will plan for procedure tomorrow.  Risks and benefits discussed with the patient including, but not limited to bleeding, infection, damage to adjacent structures or low yield requiring additional tests. All  of the patient's questions were answered, patient is agreeable to proceed. Consent signed and in chart.  Thank you for this interesting consult.  I greatly enjoyed meeting AMILIA VANDENBRINK and look forward to participating in their care.  A copy of this report was sent to the requesting provider on this date.  Electronically Signed: Docia Barrier, PA 12/01/2016, 3:48 PM   I spent a total of 40 Minutes    in face to face in clinical consultation, greater than 50% of which was counseling/coordinating care for liver lesions, possible carcinomatosis.

## 2016-12-01 NOTE — Progress Notes (Signed)
Kpad and scd's ordered. Pt ambulating  Without problems. She had 2 small BMs, the 2nd one was hemoculted and sent to lab. Blood could be seen in the stool and the pt said it was darker than the one this AM. Pt is concerned about the results of this bx to be done in the AM,but she has a strong faith in GOD.

## 2016-12-01 NOTE — Progress Notes (Signed)
[facility] Progress Note  Patient: Samantha Archer WHQ:[id number]   PCP: Estill Dooms, MD DOB: Sep 28, 1950   DOA: 11/30/2016   DOS: 12/01/2016   Date of Service: the patient was seen and examined on 12/01/2016  Subjective: No further bleeding. No nausea no vomiting. Mild point left middle quadrant tenderness. No nausea no vomiting. Actually had a bowel movement without any blood.  Brief hospital course: Pt. with PMH of breast cancer, bilateral mastectomy, hypothyroidism, GERD; admitted on 11/30/2016, presented with complaint of BRBPR, was found to have diverticular bleed. Also has peritoneal thickening concerning for carcinomatosis. Currently further plan is biopsy of the lesion.  Assessment and Plan: 1. Rectal bleeding  - Pt presents following two episodes of diarrhea with bright red blood earlier today  - There has not been any fever/chills or abdominal pain, and no N/V  - Never experienced this previously  - Hgb is wnl, exam is benign, no tachycardia or hypotension  - CT demonstrates peritoneal thickening concerning for carcinomatosis, but no obvious etiology for the bleeding  - GI was consulted by ED physician and advised admission for further inpatient monitoring  Appreciate GI input. We will recheck labs tomorrow. Has some constipation with a CT scan continue stool softeners.  2. Peritoneal thickening  - Pt noted to have peritoneal thickening and enhancement on CT, concerning for carcinomatosis  - She has hx of bilateral breast cancer and BRCA+, prompting hysterectomy with oophorectomy in May 2017  - Path from the uterus, ovaries, and fallopian tubes was negative for malignancy  - Oncology was consulted by the ED physician and much appreciated  - She has primary oncologist is Dr.Granfortuna. Who will likely see the patient tomorrow.  3. Hx breast cancer, BRCA+  - Status post bilateral mastectomies in 1990's  - Concern for possible peritoneal carcinomatosis as above     4. Hypothyroidism  - Continue Synthroid    5. Hypertension  - Secondary to anxiety regarding the current clinical situation  - Start prn Ativan, prn hydralazine IVP's    Diet: Clear liquid diet DVT Prophylaxis: mechanical compression device  Advance goals of care discussion: full code  Family Communication: no family was present at bedside, at the time of interview.   Disposition:  Discharge to home tomorrow.  Consultants: gastroenterology Procedures: none  Antibiotics: Anti-infectives    None       Objective: Physical Exam: Vitals:   11/30/16 2315 12/01/16 0639 12/01/16 1439 12/01/16 1704  BP: (!) 153/89 121/67 (!) 169/93 (!) 144/84  Pulse: 80 70 85   Resp: '20 20 18   '$ Temp: 98.9 F (37.2 C) 98.3 F (36.8 C) 98.6 F (37 C)   TempSrc: Oral Oral Oral   SpO2: 97% 99% 97%   Weight: 58.7 kg (129 lb 6.6 oz)     Height: '5\' 3"'$  (1.6 m)       Intake/Output Summary (Last 24 hours) at 12/01/16 1709 Last data filed at 12/01/16 1000  Gross per 24 hour  Intake              120 ml  Output                0 ml  Net              120 ml   Filed Weights   11/30/16 2315  Weight: 58.7 kg (129 lb 6.6 oz)   General: Alert, Awake and Oriented to Time, Place and Person. Appear in mild distress, affect appropriate Eyes:  PERRL, Conjunctiva normal ENT: Oral Mucosa clear moist. Neck: no JVD, no Abnormal Mass Or lumps Cardiovascular: S1 and S2 Present, no Murmur, Peripheral Pulses Present Respiratory: normal respiratory effort, Bilateral Air entry equal and Decreased, no use of accessory muscle, Clear to Auscultation, no Crackles, no wheezes Abdomen: Bowel Sound present, Soft and mild point tenderness, no hernia Skin: no redness, no Rash, no induration Extremities: no Pedal edema, no calf tenderness Neurologic: Grossly no focal neuro deficit. Bilaterally Equal motor strength  Data Reviewed: CBC:  Recent Labs Lab 11/30/16 1513 12/01/16 0347 12/01/16 1427  WBC 5.1 5.5 5.6   NEUTROABS 3.6  --   --   HGB 14.7 13.9 15.2*  HCT 41.5 38.9 42.4  MCV 84.7 83.8 84.3  PLT 248 235 594   Basic Metabolic Panel:  Recent Labs Lab 11/30/16 1513 12/01/16 0347  NA 141 138  K 3.8 3.5  CL 107 107  CO2 28 24  GLUCOSE 94 92  BUN 7 5*  CREATININE 0.65 0.69  CALCIUM 9.1 8.2*    Liver Function Tests:  Recent Labs Lab 11/30/16 1513  AST 23  ALT 18  ALKPHOS 39  BILITOT 1.1  PROT 7.4  ALBUMIN 4.3   No results for input(s): LIPASE, AMYLASE in the last 168 hours. No results for input(s): AMMONIA in the last 168 hours. Coagulation Profile:  Recent Labs Lab 11/30/16 1513  INR 0.97   Cardiac Enzymes: No results for input(s): CKTOTAL, CKMB, CKMBINDEX, TROPONINI in the last 168 hours. BNP (last 3 results) No results for input(s): PROBNP in the last 8760 hours. CBG: No results for input(s): GLUCAP in the last 168 hours. Studies: No results found.  Scheduled Meds: . cholecalciferol  1,000 Units Oral Daily  . levothyroxine  75 mcg Oral QAC breakfast  . multivitamin with minerals  1 tablet Oral Daily  . pantoprazole  40 mg Oral BID  . polyethylene glycol  17 g Oral Daily   Continuous Infusions: PRN Meds: acetaminophen **OR** acetaminophen, docusate sodium, fentaNYL (SUBLIMAZE) injection, hydrALAZINE, HYDROcodone-acetaminophen, LORazepam, ondansetron **OR** ondansetron (ZOFRAN) IV, polyvinyl alcohol  Time spent: 35 minutes  Author: Berle Mull, MD [facility] Pager: [phone] 12/01/2016 5:09 PM  If 7PM-7AM, please contact night-coverage at [email or web address], password [facility]

## 2016-12-02 ENCOUNTER — Encounter (HOSPITAL_COMMUNITY): Payer: Self-pay | Admitting: Radiology

## 2016-12-02 ENCOUNTER — Inpatient Hospital Stay (HOSPITAL_COMMUNITY): Payer: Medicare Other

## 2016-12-02 LAB — CBC
HEMATOCRIT: 42.5 % (ref 36.0–46.0)
HEMOGLOBIN: 15 g/dL (ref 12.0–15.0)
MCH: 29.8 pg (ref 26.0–34.0)
MCHC: 35.3 g/dL (ref 30.0–36.0)
MCV: 84.5 fL (ref 78.0–100.0)
Platelets: 263 10*3/uL (ref 150–400)
RBC: 5.03 MIL/uL (ref 3.87–5.11)
RDW: 13.2 % (ref 11.5–15.5)
WBC: 5.7 10*3/uL (ref 4.0–10.5)

## 2016-12-02 MED ORDER — POLYETHYLENE GLYCOL 3350 17 G PO PACK: 17.0000 g | PACK | Freq: Every day | ORAL | 0 refills | Status: DC

## 2016-12-02 MED ORDER — FENTANYL CITRATE (PF) 100 MCG/2ML IJ SOLN
INTRAMUSCULAR | Status: AC | PRN
Start: 2016-12-02 — End: 2016-12-02
  Administered 2016-12-02 (×2): 50 ug via INTRAVENOUS

## 2016-12-02 MED ORDER — MIDAZOLAM HCL 2 MG/2ML IJ SOLN
INTRAMUSCULAR | Status: AC
  Filled 2016-12-02: qty 4

## 2016-12-02 MED ORDER — SODIUM CHLORIDE 0.9 % IV SOLN
INTRAVENOUS | Status: AC
  Filled 2016-12-02: qty 250

## 2016-12-02 MED ORDER — FENTANYL CITRATE (PF) 100 MCG/2ML IJ SOLN
INTRAMUSCULAR | Status: AC
  Filled 2016-12-02: qty 4

## 2016-12-02 MED ORDER — MIDAZOLAM HCL 2 MG/2ML IJ SOLN
INTRAMUSCULAR | Status: AC | PRN
  Administered 2016-12-02 (×2): 1 mg via INTRAVENOUS

## 2016-12-02 NOTE — Progress Notes (Signed)
Discharge instructions given to patient,verbalized understanding. Ct biopsy site CD&I,no bleeding noted. D/c home. Stable.

## 2016-12-02 NOTE — Progress Notes (Signed)
Biopsy site clean,dry and intact.

## 2016-12-02 NOTE — Progress Notes (Signed)
[facility] Progress Note  Subjective:  Feels ok.  Anxious about biopsy results.  Had 2 small BM's, one with darker colored blood.  Hgb stable/normal/improved.  Objective:  Vital signs in last 24 hours: Temp:  [98.4 F (36.9 C)-98.6 F (37 C)] 98.5 F (36.9 C) (08/21 0534) Pulse Rate:  [74-88] 74 (08/21 0534) Resp:  [18] 18 (08/21 0534) BP: (144-175)/(80-93) 175/86 (08/21 0534) SpO2:  [97 %-99 %] 99 % (08/21 0534) Last BM Date: 12/01/16 General:  Alert, Well-developed, in NAD Heart:  Regular rate and rhythm; no murmurs Pulm:  CTAB.  No increased WOB. Abdomen:  Soft, non-distended.  BS present.  Non-tender.  Scars noted from previous surgeries.  Extremities:  Without edema. Neurologic:  Alert and oriented x 4;  grossly normal neurologically. Psych:  Alert and cooperative. Normal mood and affect.  Intake/Output from previous day: 08/20 0701 - 08/21 0700 In: 435 [P.O.:435] Out: -   Lab Results:  Recent Labs  11/30/16 1513 12/01/16 0347 12/01/16 1427  WBC 5.1 5.5 5.6  HGB 14.7 13.9 15.2*  HCT 41.5 38.9 42.4  PLT 248 235 254   BMET  Recent Labs  11/30/16 1513 12/01/16 0347  NA 141 138  K 3.8 3.5  CL 107 107  CO2 28 24  GLUCOSE 94 92  BUN 7 5*  CREATININE 0.65 0.69  CALCIUM 9.1 8.2*   LFT  Recent Labs  11/30/16 1513  PROT 7.4  ALBUMIN 4.3  AST 23  ALT 18  ALKPHOS 39  BILITOT 1.1   PT/INR  Recent Labs  11/30/16 1513  LABPROT 12.9  INR 0.97   Ct Abdomen Pelvis W Contrast  Result Date: 11/30/2016 CLINICAL DATA:  Recent diarrhea and bright red blood per rectum, initial encounter EXAM: CT ABDOMEN AND PELVIS WITH CONTRAST TECHNIQUE: Multidetector CT imaging of the abdomen and pelvis was performed using the standard protocol following bolus administration of intravenous contrast. CONTRAST:  <See Chart> ISOVUE-300 IOPAMIDOL (ISOVUE-300) INJECTION 61% COMPARISON:  09/20/2013 FINDINGS: Lower chest: Left lung base is within normal  limits. A small right pleural effusion is noted with some associated middle lobe and lower lobe atelectatic change. Hepatobiliary: Scattered hypodensities are noted throughout the liver. One of these present in the posterior aspect of the right lobe of the liver was present on the prior exam and consistent with a cyst. A few vague areas are noted both in the left and right lobes of the liver on image number 17 of series 2 and also image number 23 of series 2. Although these may represent cysts or small hemangiomas the possibility of more aggressive process would deserve consideration. The gallbladder is within normal limits. Pancreas: Unremarkable. No pancreatic ductal dilatation or surrounding inflammatory changes. Spleen: Normal in size without focal abnormality. Adrenals/Urinary Tract: The adrenal glands are within normal limits. The kidneys are well visualized bilaterally with mild extrarenal pelves. No true obstructive changes are seen. Bladder is decompressed. Stomach/Bowel: Diverticular change of the colon is noted without evidence of diverticulitis. No obstructive changes are seen. The appendix is not well visualized. No inflammatory changes to suggest appendicitis are noted. Vascular/Lymphatic: No significant vascular abnormality is noted. No significant lymphadenopathy is seen. Reproductive: The uterus has been surgically removed consistent with the patient's given clinical history. Other: There is mild ascites identified as well as significant soft tissue density throughout the omentum. This along with some peritoneal enhancement along the right pelvic wall is consistent with peritoneal carcinomatosis. Musculoskeletal: Degenerative changes of the lumbar  spine are noted. No acute bony abnormality is seen. 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 Vague hypodensities within the liver as described above  suspicious for metastatic disease. Nonemergent further workup is recommended as indicated. Postsurgical changes. Electronically Signed   By: Inez Catalina M.D.   On: 11/30/2016 17:12   Assessment / Plan: *Rectal bleeding:  Bright red blood per rectum.  Low volume.  BM in the past 24 hours with what sounds like old blood.  Diverticulosis on CT scan and colonoscopy in [place] last year.  Hgb is stable/normal.  Suspect low grade diverticular bleed or even hemorrhoidal.  Doubt any need for further evaluation at this time.   *History of BRCA 2 breast cancer bilaterally for which she follows with [facility] for MRCP/EUS monitoring.  Had B/L mastectomy in 1990's and then hysterectomy with oophorectomy in May 2017.  I am going to check a CA125 at the patient's request--she says that it was normal last year. *Liver lesions and concern for carcinomatosis on CT scan:  IR consulted for biopsy, which will be performed this AM.  Will need MRI abdomen/MRCP as well, which can likely be scheduled as an outpatient.    **Likely can be discharged later today after biopsy is performed.   LOS: 2 days   ZEHR, JESSICA D.  12/02/2016, 9:21 AM  Pager number [phone]  ________________________________________________________________________  Velora Heckler GI MD note:  I personally examined the patient, reviewed the data and agree with the assessment and plan described above.  I will plan to contact her with the biopsy results.  No plans for further tests (MRI) until that result is available.   Owens Loffler, MD [facility] Pager [phone]

## 2016-12-02 NOTE — Progress Notes (Signed)
Dsg from CT biopsy clean.dry and intact.

## 2016-12-02 NOTE — Progress Notes (Signed)
Still on bedrest until 1630. Ct bx site clean,dry and intact.

## 2016-12-02 NOTE — Progress Notes (Signed)
Dsg to bx site,clean,dry and intact.

## 2016-12-02 NOTE — Progress Notes (Signed)
Patient seen after sister coming to the office to notify us of admission.  Patient resting in bed in no acute distress.  Dr. Denman George aware of current admission and CT findings.  We will await biopsy results.

## 2016-12-02 NOTE — Procedures (Signed)
Peritoneal Bx 18 g times two EBL 0 Comp 0

## 2016-12-02 NOTE — Care Management Note (Signed)
Case Management Note  Patient Details  Name: Samantha Archer MRN: [id number] Date of Birth: 04-Aug-1950  Subjective/Objective:   66 yo admitted with Rectal bleeding. Hx of breast cancer                 Action/Plan: From home with spouse. Independent with ADLs prior to admission.  Expected Discharge Date:                  Expected Discharge Plan:     In-House Referral:     Discharge planning Services     Post Acute Care Choice:    Choice offered to:     DME Arranged:    DME Agency:     HH Arranged:    HH Agency:     Status of Service:     If discussed at [employer], dates discussed:    Additional CommentsLynnell Catalan, RN 12/02/2016, 1:46 PM  [phone]

## 2016-12-02 NOTE — Progress Notes (Signed)
Patient has bx.Dsg to abdomen clean.dry and intact. Alert and oriented x 3. Vital signs obtained. Will contine to monitor.

## 2016-12-03 LAB — CA 125: Cancer Antigen (CA) 125: 23.8 U/mL (ref 0.0–38.1)

## 2016-12-04 ENCOUNTER — Ambulatory Visit (INDEPENDENT_AMBULATORY_CARE_PROVIDER_SITE_OTHER): Payer: Medicare Other | Admitting: Oncology

## 2016-12-04 ENCOUNTER — Encounter: Payer: Self-pay | Admitting: Oncology

## 2016-12-04 VITALS — BP 140/70 | HR 81 | Temp 98.4°F | Ht 63.0 in | Wt 129.5 lb

## 2016-12-04 DIAGNOSIS — C50919 Malignant neoplasm of unspecified site of unspecified female breast: Secondary | ICD-10-CM | POA: Diagnosis not present

## 2016-12-04 DIAGNOSIS — Z1501 Genetic susceptibility to malignant neoplasm of breast: Secondary | ICD-10-CM | POA: Diagnosis present

## 2016-12-04 DIAGNOSIS — Z1509 Genetic susceptibility to other malignant neoplasm: Secondary | ICD-10-CM

## 2016-12-04 HISTORY — DX: Malignant neoplasm of unspecified site of unspecified female breast: C50.919

## 2016-12-04 NOTE — Progress Notes (Signed)
Hematology and Oncology Follow Up Visit  Samantha Archer [id number] 04-17-1950 66 y.o. 12/04/2016 4:48 PM   Principle Diagnosis: Encounter Diagnoses  Name Primary?  . Malignant neoplasm of breast, stage 4, unspecified laterality ([place])   . 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  She had a second primary stage II, 4 node positive, ER positive, cancer of the left breast diagnosed in September 1999.  She received 6 cycles of Adriamycin Cytoxan chemotherapy then 3 cycles of Taxol.  Additional Taxol not given due to development of severe peripheral neuropathy.  She was started on Arimidex which had to be discontinued due to severe joint pains and muscle weakness and exacerbation of her neuropathy.  She had the same side effect profile when she was put on a trial of Aromasin.  She was then put on tamoxifen which she took for 5 years.  She had elective bilateral mastectomies with TRAM reconstructions.  She was found to be BRCA positive.  Many years later in May 2017 she underwent a robotic assisted hysterectomy and bilateral oophorectomy by Dr. Everitt Amber.  No signs of cancer at that time. She now presents with a two-month history of low-grade abdominal fullness and discomfort which she thought was musculoskeletal.  This started coincidentally when she tried to lift a heavy relative who had fallen down.  She came to the hospital to be evaluated for transient rectal bleeding which occurred about a day after she did a colon cleanse enema.  She had a normal colonoscopy in February 2017.  As part of her evaluation, a CT scan of the abdomen and pelvis was done on August 19 and unexpectedly showed diffuse peritoneal studding, a small amount of ascites, and a small right pleural effusion.  On exam she had fullness in the  umbilicus area.  She underwent a core needle biopsy on August 21.  Pathology shows cells diagnostic for lobular breast cancer, ER positive.  A CA 125 tumor marker was normal at 23.8 units normal up to 38.1.  Chemistry profile shows normal renal, hepatic, and calcium.  CBC is normal.  Medications: reviewed  Allergies:  Allergies  Allergen Reactions  . Heparin Hives    Reaction from 30 years ago had to put her on Coumadin instead.  . Oxycodone Nausea Only  . Penicillins Other (See Comments)    Reaction unknown occurred while in college Has patient had a PCN reaction causing immediate rash, facial/tongue/throat swelling, SOB or lightheadedness with hypotension: reaction unknown Has patient had a PCN reaction causing severe rash involving mucus membranes or skin necrosis: reaction unknown Has patient had a PCN reaction that required hospitalization no Has patient had a PCN reaction occurring within the last 10 years: no If all of the above answers are "NO", then may proceed with Cep    Review of Systems: See interim history Remaining ROS negative:   Physical Exam: Blood pressure 140/70, pulse 81, temperature 98.4 F (36.9 C), temperature source Oral, height '5\' 3"'$  (1.6 m), weight 129 lb 8 oz (58.7 kg), SpO2 98 %. Wt Readings from Last 3 Encounters:  12/04/16 129 lb 8 oz (58.7 kg)  11/30/16 129 lb 6.6 oz (58.7 kg)  11/14/15 132 lb (59.9 kg)     General appearance: Petite Caucasian woman HENNT: Pharynx no erythema, exudate, mass, or ulcer. No thyromegaly or thyroid  nodules Lymph nodes: No cervical, supraclavicular, or axillary lymphadenopathy Breasts: Bilateral TRAM reconstructions Lungs: Clear to auscultation, resonant to percussion throughout Heart: Regular rhythm, no murmur, no gallop, no rub, no click, no edema Abdomen: Soft, nontender, mildly distended, soft tissue fullness over about a vertical 10 cm area above and below the navel.  Normal bowel sounds, no mass, no  organomegaly Extremities: No edema, no calf tenderness Musculoskeletal: no joint deformities GU:  Vascular: Carotid pulses 2+, no bruits, distal pulses: Neurologic: Alert, oriented, PERRLA, optic discs sharp and vessels normal on the left, unable to visualize the disc on the right,, no hemorrhage or exudate, cranial nerves grossly normal, motor strength 5 over 5, reflexes 1+ symmetric, upper body coordination normal, gait normal, Skin: No rash or ecchymosis  Lab Results: CBC W/Diff    Component Value Date/Time   WBC 5.7 12/02/2016 1135   RBC 5.03 12/02/2016 1135   HGB 15.0 12/02/2016 1135   HGB 14.4 04/02/2012 1220   HCT 42.5 12/02/2016 1135   HCT 41.1 04/02/2012 1220   PLT 263 12/02/2016 1135   PLT 242 04/02/2012 1220   MCV 84.5 12/02/2016 1135   MCV 85.1 10/03/2015 1026   MCV 86.3 04/02/2012 1220   MCH 29.8 12/02/2016 1135   MCHC 35.3 12/02/2016 1135   RDW 13.2 12/02/2016 1135   RDW 13.7 04/02/2012 1220   LYMPHSABS 1.1 11/30/2016 1513   LYMPHSABS 1.3 04/02/2012 1220   MONOABS 0.5 11/30/2016 1513   MONOABS 0.6 04/02/2012 1220   EOSABS 0.1 11/30/2016 1513   EOSABS 0.1 04/02/2012 1220   BASOSABS 0.0 11/30/2016 1513   BASOSABS 0.0 04/02/2012 1220     Chemistry      Component Value Date/Time   NA 138 12/01/2016 0347   NA 138 02/12/2016   NA 139 04/02/2012 1220   K 3.5 12/01/2016 0347   K 4.3 04/02/2012 1220   CL 107 12/01/2016 0347   CL 104 04/02/2012 1220   CO2 24 12/01/2016 0347   CO2 29 04/02/2012 1220   BUN 5 (L) 12/01/2016 0347   BUN 12 02/12/2016   BUN 16.0 04/02/2012 1220   CREATININE 0.69 12/01/2016 0347   CREATININE 0.80 09/15/2016 1614   CREATININE 0.8 04/02/2012 1220   GLU 102 02/12/2016      Component Value Date/Time   CALCIUM 8.2 (L) 12/01/2016 0347   CALCIUM 9.5 04/02/2012 1220   ALKPHOS 39 11/30/2016 1513   ALKPHOS 53 04/02/2012 1220   AST 23 11/30/2016 1513   AST 20 04/02/2012 1220   ALT 18 11/30/2016 1513   ALT 15 04/02/2012 1220    BILITOT 1.1 11/30/2016 1513   BILITOT 1.07 04/02/2012 1220       Radiological Studies: Ct Abdomen Pelvis W Contrast  Result Date: 11/30/2016 CLINICAL DATA:  Recent diarrhea and bright red blood per rectum, initial encounter EXAM: CT ABDOMEN AND PELVIS WITH CONTRAST TECHNIQUE: Multidetector CT imaging of the abdomen and pelvis was performed using the standard protocol following bolus administration of intravenous contrast. CONTRAST:  <See Chart> ISOVUE-300 IOPAMIDOL (ISOVUE-300) INJECTION 61% COMPARISON:  09/20/2013 FINDINGS: Lower chest: Left lung base is within normal limits. A small right pleural effusion is noted with some associated middle lobe and lower lobe atelectatic change. Hepatobiliary: Scattered hypodensities are noted throughout the liver. One of these present in the posterior aspect of the right lobe of the liver was present on the prior exam and consistent with a cyst. A few vague areas are noted both in the left and right  lobes of the liver on image number 17 of series 2 and also image number 23 of series 2. Although these may represent cysts or small hemangiomas the possibility of more aggressive process would deserve consideration. The gallbladder is within normal limits. Pancreas: Unremarkable. No pancreatic ductal dilatation or surrounding inflammatory changes. Spleen: Normal in size without focal abnormality. Adrenals/Urinary Tract: The adrenal glands are within normal limits. The kidneys are well visualized bilaterally with mild extrarenal pelves. No true obstructive changes are seen. Bladder is decompressed. Stomach/Bowel: Diverticular change of the colon is noted without evidence of diverticulitis. No obstructive changes are seen. The appendix is not well visualized. No inflammatory changes to suggest appendicitis are noted. Vascular/Lymphatic: No significant vascular abnormality is noted. No significant lymphadenopathy is seen. Reproductive: The uterus has been surgically removed  consistent with the patient's given clinical history. Other: There is mild ascites identified as well as significant soft tissue density throughout the omentum. This along with some peritoneal enhancement along the right pelvic wall is consistent with peritoneal carcinomatosis. Musculoskeletal: Degenerative changes of the lumbar spine are noted. No acute bony abnormality is seen. 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 Vague hypodensities within the liver as described above suspicious for metastatic disease. Nonemergent further workup is recommended as indicated. Postsurgical changes. Electronically Signed   By: Inez Catalina M.D.   On: 11/30/2016 17:12   Ct Biopsy  Result Date: 12/02/2016 INDICATION: Peritoneal carcinomatosis EXAM: CT BIOPSY MEDICATIONS: None. ANESTHESIA/SEDATION: Fentanyl 100 mcg IV; Versed 2 mg IV Moderate Sedation Time:  13 The patient was continuously monitored during the procedure by the interventional radiology nurse under my direct supervision. FLUOROSCOPY TIME:  None COMPLICATIONS: None immediate. PROCEDURE: Informed written consent was obtained from the patient after a thorough discussion of the procedural risks, benefits and alternatives. All questions were addressed. Maximal Sterile Barrier Technique was utilized including caps, mask, sterile gowns, sterile gloves, sterile drape, hand hygiene and skin antiseptic. A timeout was performed prior to the initiation of the procedure. Under CT guidance, a(n) 17 gauge guide needle was advanced into the omental caking. Subsequently 3 18 gauge core biopsies were obtained. The guide needle was removed. Post biopsy images demonstrate no hemorrhage. Patient tolerated the procedure well without complication. Vital sign monitoring by nursing staff during the procedure will continue as patient is in the special procedures unit for post procedure  observation. FINDINGS: The images document guide needle placement within the omentum. Post biopsy images demonstrate no hemorrhage. IMPRESSION: Successful CT-guided biopsy of omental caking in the abdomen. Electronically Signed   By: Marybelle Killings M.D.   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  I had a lengthy discussion with her and her sister about contemporary treatment options.  Although she would be a good candidate for a PARP inhibitor, given the excellent results we are seeing with a combination of a AI plus a cyclin dependent kinase, this would be my first choice.  Since she did not tolerate either a steroidal or nonsteroidal AI, good alternative would be the use of fulvestrant plus a CDK inhibitor. I told her that since I am no longer treating solid tumors and I am nearing retirement age, she would be best served by seeing 1 of our breast oncologist.  I discussed her situation with Dr.  Gudina and he graciously agreed to evaluate the patient. We discussed she will probably need a PET scan to complete her staging.  CC: Patient Care Team: Estill Dooms, MD as PCP - General (Internal Medicine) Georgeann Oppenheim, MD as Consulting Physician (Ophthalmology) Everitt Amber, MD as Consulting Physician (Obstetrics and Gynecology) Ninetta Lights, MD as Consulting Physician (Orthopedic Surgery)   Murriel Hopper, MD, [facility]  Hematology-Oncology/Internal Medicine     8/23/20184:48 PM

## 2016-12-05 ENCOUNTER — Telehealth: Payer: Self-pay

## 2016-12-05 NOTE — Telephone Encounter (Signed)
L/M stating that Dr. Beryle Beams discussed her case with Dr. Lindi Adie who will graciously follow up with Ms. Nill..  She should get a phone call from scheduling  With an  appointment with Dr. Lindi Adie with in the next 1-2 weeks. She can call back to Dr. Geralyn Flash office at [phone] if she has any questions or concerns.

## 2016-12-08 ENCOUNTER — Telehealth: Payer: Self-pay | Admitting: Hematology and Oncology

## 2016-12-08 NOTE — Telephone Encounter (Signed)
Appt has been scheduled for the pt to see Dr. Lindi Adie on 8/29 at 1pm.

## 2016-12-08 NOTE — Discharge Summary (Signed)
[facility] Discharge Summary   Patient: Samantha Archer ZOX:[id number]   PCP: Estill Dooms, MD DOB: [birth year]   Date of admission: 11/30/2016   Date of discharge: 12/02/2016     Discharge Diagnoses:  Principal Problem:   Rectal bleeding Active Problems:   Hypothyroidism   BREAST CANCER, PERSONAL HX   BRCA2 positive   Liver lesion   Peritoneal lesion   Bright red rectal bleeding   Admitted From: home Disposition:  home  Recommendations for Outpatient Follow-up:  1. Please follow up with PCP and oncology   Follow-up Information    Estill Dooms, MD. Schedule an appointment as soon as possible for a visit in 1 week(s).   Specialty:  Internal Medicine Contact information: [place] [address] [phone]        Annia Belt, MD. Schedule an appointment as soon as possible for a visit in 1 week(s).   Specialty:  Oncology Contact information: [address] [place] [address] [phone]        Everitt Amber, MD. Schedule an appointment as soon as possible for a visit in 1 month(s).   Specialty:  Obstetrics and Gynecology Contact information: [place] [address] [phone]          Diet recommendation: regular diet  Activity: The patient is advised to gradually reintroduce usual activities.  Discharge Condition: good  Code Status: full code  History of present illness: As per the H and P dictated on admission, "Samantha Archer is a 66 y.o. female with medical history significant for breast cancer status post bilateral mastectomies in the 1990s, hypothyroidism, and GERD, now presenting to the emergency department for evaluation of bright red blood per rectum. Patient reports suffering from mild intermittent abdominal discomfort that she attributes to biliary colic, but had otherwise been doing fairly well until this morning when she had 2 episodes of diarrhea with bright red blood. He denies  any abdominal pain per se, but notes some vague discomfort throughout the abdomen, but particularly in the left lower quadrant. She denies recent fevers or chills, denies night sweats, and denies significant weight loss. She has never experienced similar symptoms previously.   ED Course: Upon arrival to the ED, patient is found to be afebrile, saturating well on room air, and with vitals stable. Chemistry panel is unremarkable and CBC is within normal limits. Lactic acid is reassuring at 0.85. CT of the abdomen and pelvis was obtained and concerning for peritoneal thickening and enhancement with small ascites concerning for a meal carcinomatosis. Also noted on the CT is vague hypodensities in the liver concerning for possible metastatic disease. Patient was given a liter of normal saline in the emergency department and gastroenterology was consulted by the ED physician. Gastroenterologist recommended a medical admission for further evaluation of the patient's bleeding. Oncology was also consulted by the ED physician and indicated that workup could be done inpatient or outpatient. Patient remained stable in the emergency department and will be admitted to the medical surgical unit for ongoing evaluation and management of painless rectal bleeding and CT findings concerning for possible peritoneal carcinomatosis."  Hospital Course:  Summary of her active problems in the hospital is as following.  1. Rectal bleeding  - Pt presents following two episodes of diarrhea with bright red blood - There has not been any fever/chills or abdominal pain, and no N/V  - Never experienced this previously  - Hgb is wnl, exam is  benign, no tachycardia or hypotension  - CT demonstrates peritoneal thickening concerning for carcinomatosis, but no obvious etiology for the bleeding  Has some constipation with a CT scan continue stool softeners. [facility] was consulted, Hb remained stable, no further work up  recommended  2. Peritoneal thickening  - Pt noted to have peritoneal thickening and enhancement on CT, concerning for carcinomatosis  - She has hx of bilateral breast cancer and BRCA+, prompting hysterectomy with oophorectomy in May 2017  - Path from the uterus, ovaries, and fallopian tubes was negative for malignancy  - Oncology was consulted by the ED physician and much appreciated  - She has primary oncologist is Dr.Granfortuna. IR consulted for Biopsy, completed and tolerated well.   3. Hx breast cancer, BRCA+  - Status post bilateral mastectomies in 1990's  - Concern for possible peritoneal carcinomatosis as above   4. Hypothyroidism  - Continue Synthroid   5. Hypertension  - Secondary to anxiety regarding the current clinical situation     All other chronic medical condition were stable during the hospitalization.  Patient was ambulatory without any assistance. On the day of the discharge the patient's vitals were stable, and no other acute medical condition were reported by patient. the patient was felt safe to be discharge at home with family.  Procedures and Results:  CT guided biopsy   Consultations:  IR  Oncology   Gastroenterology  DISCHARGE MEDICATION: Discharge Medication List as of 12/02/2016  5:34 PM    START taking these medications   Details  polyethylene glycol (MIRALAX / GLYCOLAX) packet Take 17 g by mouth daily., Starting Wed 12/03/2016, Normal      CONTINUE these medications which have NOT CHANGED   Details  cholecalciferol (VITAMIN D) 1000 UNITS tablet Take 1,000 Units by mouth daily., Until Discontinued, Historical Med    denosumab (PROLIA) 60 MG/ML SOLN injection Inject 60 mg into the skin every 6 (six) months., Until Discontinued, Historical Med    docusate sodium (COLACE) 100 MG capsule Take 100 mg by mouth daily as needed for mild constipation., Historical Med    hyoscyamine (ANASPAZ) 0.125 MG TBDP disintergrating tablet Place  0.125 mg under the tongue daily as needed for cramping., Historical Med    ibuprofen (ADVIL,MOTRIN) 200 MG tablet Take 200 mg by mouth every 6 (six) hours as needed for mild pain., Historical Med    levothyroxine (SYNTHROID, LEVOTHROID) 75 MCG tablet Take 75 mcg by mouth daily. , Until Discontinued, Historical Med    Multiple Vitamin (MULTIVITAMIN WITH MINERALS) TABS tablet Take 1 tablet by mouth daily. Reported on 09/17/2015, Until Discontinued, Historical Med    pantoprazole (PROTONIX) 40 MG tablet Take 40 mg by mouth daily as needed (For heartburn or acid refux.)., Until Discontinued, Historical Med    Polyvinyl Alcohol-Povidone (REFRESH OP) Place 1 drop into both eyes daily as needed (For dry eyes.)., Until Discontinued, Historical Med       Allergies  Allergen Reactions  . Heparin Hives    Reaction from 30 years ago had to put her on Coumadin instead.  . Oxycodone Nausea Only  . Penicillins Other (See Comments)    Reaction unknown occurred while in college Has patient had a PCN reaction causing immediate rash, facial/tongue/throat swelling, SOB or lightheadedness with hypotension: reaction unknown Has patient had a PCN reaction causing severe rash involving mucus membranes or skin necrosis: reaction unknown Has patient had a PCN reaction that required hospitalization no Has patient had a PCN reaction occurring within the  last 10 years: no If all of the above answers are "NO", then may proceed with Cep   Discharge Instructions    Diet general    Complete by:  As directed    Discharge instructions    Complete by:  As directed    It is important that you read following instructions as well as go over your medication list with RN to help you understand your care after this hospitalization.  Discharge Instructions: Please follow-up with PCP in one week  Please request your primary care physician to go over all Hospital Tests and Procedure/Radiological results at the follow up,    Please get all Hospital records sent to your PCP by signing hospital release before you go home.   Do not take more than prescribed Pain, Sleep and Anxiety Medications. You were cared for by a hospitalist during your hospital stay. If you have any questions about your discharge medications or the care you received while you were in the hospital after you are discharged, you can call the unit and ask to speak with the hospitalist on call if the hospitalist that took care of you is not available.  Once you are discharged, your primary care physician will handle any further medical issues. Please note that NO REFILLS for any discharge medications will be authorized once you are discharged, as it is imperative that you return to your primary care physician (or establish a relationship with a primary care physician if you do not have one) for your aftercare needs so that they can reassess your need for medications and monitor your lab values. You Must read complete instructions/literature along with all the possible adverse reactions/side effects for all the Medicines you take and that have been prescribed to you. Take any new Medicines after you have completely understood and accept all the possible adverse reactions/side effects. Wear Seat belts while driving. If you have smoked or chewed Tobacco in the last 2 yrs please stop smoking and/or stop any Recreational drug use.   Increase activity slowly    Complete by:  As directed      Discharge Exam: Filed Weights   11/30/16 2315  Weight: 58.7 kg (129 lb 6.6 oz)   Vitals:   12/02/16 1555 12/02/16 1659  BP: 101/64 114/82  Pulse: 70 69  Resp: 16   Temp: (!) 97.4 F (36.3 C) 98.2 F (36.8 C)  SpO2: 97% 97%   General: Appear in no distress, no Rash; Oral Mucosa moist. Cardiovascular: S1 and S2 Present, no Murmur, no JVD Respiratory: Bilateral Air entry present and Clear to Auscultation, no Crackles, no wheezes Abdomen: Bowel Sound present,  Soft and no tenderness Extremities: no Pedal edema, no calf tenderness Neurology: Grossly no focal neuro deficit.  The results of significant diagnostics from this hospitalization (including imaging, microbiology, ancillary and laboratory) are listed below for reference.    Significant Diagnostic Studies: Ct Abdomen Pelvis W Contrast  Result Date: 11/30/2016 CLINICAL DATA:  Recent diarrhea and bright red blood per rectum, initial encounter EXAM: CT ABDOMEN AND PELVIS WITH CONTRAST TECHNIQUE: Multidetector CT imaging of the abdomen and pelvis was performed using the standard protocol following bolus administration of intravenous contrast. CONTRAST:  <See Chart> ISOVUE-300 IOPAMIDOL (ISOVUE-300) INJECTION 61% COMPARISON:  09/20/2013 FINDINGS: Lower chest: Left lung base is within normal limits. A small right pleural effusion is noted with some associated middle lobe and lower lobe atelectatic change. Hepatobiliary: Scattered hypodensities are noted throughout the liver. One of these present in the  posterior aspect of the right lobe of the liver was present on the prior exam and consistent with a cyst. A few vague areas are noted both in the left and right lobes of the liver on image number 17 of series 2 and also image number 23 of series 2. Although these may represent cysts or small hemangiomas the possibility of more aggressive process would deserve consideration. The gallbladder is within normal limits. Pancreas: Unremarkable. No pancreatic ductal dilatation or surrounding inflammatory changes. Spleen: Normal in size without focal abnormality. Adrenals/Urinary Tract: The adrenal glands are within normal limits. The kidneys are well visualized bilaterally with mild extrarenal pelves. No true obstructive changes are seen. Bladder is decompressed. Stomach/Bowel: Diverticular change of the colon is noted without evidence of diverticulitis. No obstructive changes are seen. The appendix is not well visualized.  No inflammatory changes to suggest appendicitis are noted. Vascular/Lymphatic: No significant vascular abnormality is noted. No significant lymphadenopathy is seen. Reproductive: The uterus has been surgically removed consistent with the patient's given clinical history. Other: There is mild ascites identified as well as significant soft tissue density throughout the omentum. This along with some peritoneal enhancement along the right pelvic wall is consistent with peritoneal carcinomatosis. Musculoskeletal: Degenerative changes of the lumbar spine are noted. No acute bony abnormality is seen. 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 Vague hypodensities within the liver as described above suspicious for metastatic disease. Nonemergent further workup is recommended as indicated. Postsurgical changes. Electronically Signed   By: Inez Catalina M.D.   On: 11/30/2016 17:12   Ct Biopsy  Result Date: 12/02/2016 INDICATION: Peritoneal carcinomatosis EXAM: CT BIOPSY MEDICATIONS: None. ANESTHESIA/SEDATION: Fentanyl 100 mcg IV; Versed 2 mg IV Moderate Sedation Time:  13 The patient was continuously monitored during the procedure by the interventional radiology nurse under my direct supervision. FLUOROSCOPY TIME:  None COMPLICATIONS: None immediate. PROCEDURE: Informed written consent was obtained from the patient after a thorough discussion of the procedural risks, benefits and alternatives. All questions were addressed. Maximal Sterile Barrier Technique was utilized including caps, mask, sterile gowns, sterile gloves, sterile drape, hand hygiene and skin antiseptic. A timeout was performed prior to the initiation of the procedure. Under CT guidance, a(n) 17 gauge guide needle was advanced into the omental caking. Subsequently 3 18 gauge core biopsies were obtained. The guide needle was removed. Post biopsy images demonstrate  no hemorrhage. Patient tolerated the procedure well without complication. Vital sign monitoring by nursing staff during the procedure will continue as patient is in the special procedures unit for post procedure observation. FINDINGS: The images document guide needle placement within the omentum. Post biopsy images demonstrate no hemorrhage. IMPRESSION: Successful CT-guided biopsy of omental caking in the abdomen. Electronically Signed   By: Marybelle Killings M.D.   On: 12/02/2016 13:53    Microbiology: No results found for this or any previous visit (from the past 240 hour(s)).   Labs: CBC:  Recent Labs Lab 12/01/16 1427 12/02/16 1135  WBC 5.6 5.7  HGB 15.2* 15.0  HCT 42.4 42.5  MCV 84.3 84.5  PLT 254 263   Time spent: 35 minutes  Signed:  Jeshawn Melucci  [facility] 12/02/2016   , 11:22 AM

## 2016-12-08 NOTE — Telephone Encounter (Signed)
Duplicate encounter

## 2016-12-09 ENCOUNTER — Telehealth: Payer: Self-pay

## 2016-12-09 NOTE — Telephone Encounter (Signed)
Told Samantha Baize that Dr. Denman George said that Dr. Lindi Adie could discuss Samantha Archer"s case with her An office consultation would not be beneficial for her plan of care given the pelvic recurrence is from the breast cancer not a gyn malignancy. Encouraged her to discuss her concerns of the nature of area of recurrence for breast cancer  being uncommon and would like input from a physician who has had knowledge and or experience with type of recurrence.

## 2016-12-10 ENCOUNTER — Ambulatory Visit (HOSPITAL_BASED_OUTPATIENT_CLINIC_OR_DEPARTMENT_OTHER): Payer: Medicare Other

## 2016-12-10 ENCOUNTER — Ambulatory Visit (HOSPITAL_BASED_OUTPATIENT_CLINIC_OR_DEPARTMENT_OTHER): Payer: Medicare Other | Admitting: Hematology and Oncology

## 2016-12-10 ENCOUNTER — Encounter: Payer: Self-pay | Admitting: Hematology and Oncology

## 2016-12-10 DIAGNOSIS — Z1501 Genetic susceptibility to malignant neoplasm of breast: Secondary | ICD-10-CM | POA: Diagnosis not present

## 2016-12-10 DIAGNOSIS — Z853 Personal history of malignant neoplasm of breast: Secondary | ICD-10-CM

## 2016-12-10 DIAGNOSIS — E039 Hypothyroidism, unspecified: Secondary | ICD-10-CM

## 2016-12-10 DIAGNOSIS — C786 Secondary malignant neoplasm of retroperitoneum and peritoneum: Secondary | ICD-10-CM | POA: Diagnosis present

## 2016-12-10 DIAGNOSIS — C50919 Malignant neoplasm of unspecified site of unspecified female breast: Secondary | ICD-10-CM

## 2016-12-10 NOTE — Progress Notes (Signed)
Pt requesting to have patient progress notes sent and faxed to her primary care MD in [place]. Pt states that she wants to keep them informed on everything that is going on with pt. Her primary MD information: ([place]) Dr.Emily Essert,DO [id number], [place] [address]. Phone: [phone] and FAX# [phone].

## 2016-12-10 NOTE — Assessment & Plan Note (Signed)
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 PET/CT scan 2. Start palliative treatment with Palbociclib and letrozole Counseling: I discussed at length the risks and benefits of Palbociclib in combination with letrozole. Adverse effects of Palbociclib include decreasing neutrophil count, pneumonia, blood clots in lungs as well as nausea and GI symptoms. Side effects of letrozole include hot flashes, muscle aches and pains, uterine bleeding/spotting/cancer, osteoporosis, risk of blood clots.  There are other treatment options including parp inhibitors like Olaparib. We can certainly consider using them if there was intolerance of progression on the current treatment.  I provided her with this month's prescription for Palbociclib today. Return to clinic in 2 weeks for toxicity check and discussion a PET/CT scan.

## 2016-12-10 NOTE — Progress Notes (Signed)
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 female is here because of recent diagnosis of peritoneal carcinomatosis when she was recently admitted to the hospital with GI bleeding. Patient has a long-standing history of bilateral breast cancers treated with bilateral mastectomies. She required adjuvant chemotherapy and radiation and 5 years of tamoxifen after the second cancer. She had been doing quite well up until recently she noticed some bright red blood per rectum and went to the emergency room and got admitted to the hospital. CT of the abdomen revealed peritoneal carcinomatosis. 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 She was referred by Dr. Fatima Sanger for [facility] to discuss different treatment options. Patient is accompanied by her husband and her sister. She was noted to have a recent BRCA2 mutation positivity. She had a hysterectomy and bilateral salpingo-oophorectomy last year). On washings at that time were negative. She saw Dr. Denman George previously.  I reviewed her records extensively and collaborated the history with the patient.  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      12/1997 Relapse/Recurrence    Left breast cancer: stage II, 4 node positive, ER positive      12/1997 Surgery    Bilateral mastectomies with reconstruction      01/1998 - 05/1998 Chemotherapy    6 cycles of Adriamycin and  Cytoxan followed by 3 doses of Taxol stopped early due to his peripheral neuropathy       07/1998 - 08/1998 Radiation Therapy    Adjuvant radiation therapy      2000 - 2005 Anti-estrogen oral therapy    Tamoxifen 20 mg daily 5 years       Miscellaneous    BRCA2 mutation      11/25/2016 Relapse/Recurrence    Peritoneal biopsy: Metastatic lobular carcinoma of the breast positive for ER, G ATA 3, CK 7.      11/30/2016 Imaging    CT abdomen and pelvis: Changes consistent with peritoneal carcinomatosis with peritoneal thickening and enhancement as well as mild ascites. Weak hypodensities within the liver suspicious.      MEDICAL HISTORY:  Past Medical History:  Diagnosis Date  . Arthritis   . Breast cancer ([place])   . Breast cancer, stage 4 ([place]) 12/04/2016   12/02/16 Peritoneal recurrence ER positive; known BRACA 2 status   . Cancer ([place])    bilat mastectomies  . Diverticulosis   . DVT (deep venous thrombosis) ([place]) 1974  . GERD (gastroesophageal reflux disease)   . History of skin cancer   . Hyperglycemia 11/14/2015  . Hypothyroidism   . Neuropathy   . Osteoporosis   . PONV (postoperative nausea and vomiting)     SURGICAL HISTORY: Past Surgical History:  Procedure Laterality Date  . BREAST SURGERY    . DILATION AND CURETTAGE OF UTERUS    . FRACTURE SURGERY Left 09/2010  . HARDWARE REMOVAL  01/01/2012   Procedure: HARDWARE REMOVAL;  Surgeon: Ninetta Lights, MD;  Location: [facility];  Service: Orthopedics;  Laterality: Left;  left knee arthroscopy with debridement/shaving( chondroplasty) hardware removal  . HYSTEROSCOPY W/D&C  08/04/2003   also polypectomy  . KNEE ARTHROPLASTY  6/12x2   patella-lt  . KNEE ARTHROSCOPY  01/01/2012   Procedure: ARTHROSCOPY KNEE;  Surgeon: Ninetta Lights, MD;  Location: [facility];  Service: Orthopedics;  Laterality: Left;  left knee arthroscopy with debridement/shaving( chondroplasty) hardware removal  .  MASTECTOMY     bilateral, with reconstruction  . MODIFIED RADICAL MASTECTOMY W/ AXILLARY LYMPH NODE DISSECTION  1991   right  . MODIFIED RADICAL MASTECTOMY W/ AXILLARY LYMPH NODE DISSECTION  1999   left  . RECONSTRUCTION BREAST W/ TRAM FLAP  1999   bilat  . ROBOTIC ASSISTED TOTAL HYSTERECTOMY WITH BILATERAL SALPINGO OOPHERECTOMY Bilateral 08/28/2015   Procedure: XI ROBOTIC ASSISTED TOTAL HYSTERECTOMY WITH BILATERAL SALPINGO OOPHORECTOMY WITH PERITONEAL WASHINGS;  Surgeon: Everitt Amber, MD;  Location: [facility];  Service: Gynecology;  Laterality: Bilateral;  . TONSILLECTOMY      SOCIAL HISTORY: Social History   Social History  . Marital status: Married    Spouse name: Samantha Archer   . Number of children: 2  . Years of education: 68   Occupational History  . retired     Social History Main Topics  . Smoking status: Never Smoker  . Smokeless tobacco: Never Used  . Alcohol use 7.2 oz/week    12 Glasses of wine per week     Comment: daily wine  . Drug use: No  . Sexual activity: Yes    Birth control/ protection: Surgical   Other Topics Concern  . Not on file   Social History Narrative   Married   2 sons   Never smoked   Alcohol wine daily   Exercise 7 days a week   POA, Living Will    FAMILY HISTORY: Family History  Problem Relation Age of Onset  . Pancreatic cancer Father        died at 54  . Cancer Father   . Stomach cancer Mother        died at 67  . Diabetes Mother   . Cancer Mother   . Uterine cancer Maternal Grandmother   . Cancer Paternal Grandfather   . Memory loss Brother        B12 deficiency    ALLERGIES:  is allergic to heparin; oxycodone; and penicillins.  MEDICATIONS:  Current Outpatient Prescriptions  Medication Sig Dispense Refill  . cholecalciferol (VITAMIN D) 1000 UNITS tablet Take 1,000 Units by mouth daily.    Marland Kitchen denosumab (PROLIA) 60 MG/ML SOLN injection Inject 60 mg into the skin every 6 (six) months.    . docusate sodium (COLACE) 100 MG  capsule Take 100 mg by mouth daily as needed for mild constipation.    . hyoscyamine (ANASPAZ) 0.125 MG TBDP disintergrating tablet Place 0.125 mg under the tongue daily as needed for cramping.    Marland Kitchen ibuprofen (ADVIL,MOTRIN) 200 MG tablet Take 200 mg by mouth every 6 (six) hours as needed for mild pain.    Marland Kitchen levothyroxine (SYNTHROID, LEVOTHROID) 75 MCG tablet Take 75 mcg by mouth daily.     . Multiple Vitamin (MULTIVITAMIN WITH MINERALS) TABS tablet Take 1 tablet by mouth daily. Reported on 09/17/2015    . pantoprazole (PROTONIX) 40 MG tablet Take 40 mg by mouth daily as needed (For heartburn or acid refux.).    Marland Kitchen polyethylene glycol (MIRALAX / GLYCOLAX) packet Take 17 g by mouth daily. [place]  each 0  . Polyvinyl Alcohol-Povidone (REFRESH OP) Place 1 drop into both eyes daily as needed (For dry eyes.).     No current facility-administered medications for this visit.     REVIEW OF SYSTEMS:   Constitutional: Denies fevers, chills or abnormal night sweats Eyes: Denies blurriness of vision, double vision or watery eyes Ears, nose, mouth, throat, and face: Denies mucositis or sore throat Respiratory: Denies cough, dyspnea or wheezes Cardiovascular: Denies palpitation, chest discomfort or lower extremity swelling Gastrointestinal:  Mild abdominal discomfort Skin: Denies abnormal skin rashes Lymphatics: Denies new lymphadenopathy or easy bruising Neurological:Denies numbness, tingling or new weaknesses Behavioral/Psych: Mood is stable, no new changes   All other systems were reviewed with the patient and are negative.  PHYSICAL EXAMINATION: ECOG PERFORMANCE STATUS: 1 - Symptomatic but completely ambulatory  Vitals:   12/10/16 1302  BP: 139/90  Pulse: 88  Resp: 18  Temp: 98.4 F (36.9 C)  SpO2: 98%   Filed Weights   12/10/16 1302  Weight: 125 lb 6.4 oz (56.9 kg)    GENERAL:alert, no distress and comfortable SKIN: skin color, texture, turgor are normal, no rashes or significant  lesions EYES: normal, conjunctiva are pink and non-injected, sclera clear OROPHARYNX:no exudate, no erythema and lips, buccal mucosa, and tongue normal  NECK: supple, thyroid normal size, non-tender, without nodularity LYMPH:  no palpable lymphadenopathy in the cervical, axillary or inguinal LUNGS: clear to auscultation and percussion with normal breathing effort HEART: regular rate & rhythm and no murmurs and no lower extremity edema ABDOMEN:abdomen soft, non-tender and normal bowel sounds Musculoskeletal:no cyanosis of digits and no clubbing  PSYCH: alert & oriented x 3 with fluent speech NEURO: no focal motor/sensory deficits LABORATORY DATA:  I have reviewed the data as listed Lab Results  Component Value Date   WBC 5.7 12/02/2016   HGB 15.0 12/02/2016   HCT 42.5 12/02/2016   MCV 84.5 12/02/2016   PLT 263 12/02/2016   Lab Results  Component Value Date   NA 138 12/01/2016   K 3.5 12/01/2016   CL 107 12/01/2016   CO2 24 12/01/2016    RADIOGRAPHIC STUDIES: I have personally reviewed the radiological reports and agreed with the findings in the report.  ASSESSMENT AND PLAN:  Breast cancer, stage 4 ([place]) Metastatic breast cancer: BRCA2 mutation positive Prior history of bilateral mastectomies 1991: Right breast DCIS 1999: Left breast invasive lobular cancer with 4 positive lymph nodes ER/PR positive HER-2 negative treated with bilateral mastectomies, adjuvant chemotherapy AC Taxol, radiation, tamoxifen 5 years  CT abdomen and pelvis 11/30/2016:. 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 PET/CT scan 2. Foundation 1 analysis  3. Palliative treatment with Palbociclib and letrozole Counseling: I discussed at length the risks and benefits of Palbociclib in combination with letrozole. Adverse effects of Palbociclib include decreasing neutrophil count, pneumonia, blood clots in lungs as well as nausea and GI symptoms. Side  effects of letrozole include hot flashes, muscle aches and pains, uterine bleeding/spotting/cancer, osteoporosis, risk of blood clots.  There are other treatment options including PARP inhibitors like Olaparib or Telazoparib. We can certainly consider using them if there was intolerance of progression on the current treatment.  I discussed with her different antiestrogen treatment options including letrozole as well as Fosamax. Patient previously had tried letrozole only very briefly. If she is intolerant to letrozole therapy then we can give her Faslodex.  Patient and her family are intent on going to [facility] for a second opinion  visit. I encouraged him to get a second opinion and let us know how we can help them. She also talked about going to [facility] as well as [facility]. Patient will let me know if she is worried to start treatment. Until then I will not give her any follow-up appointments and she calls back.  Return to clinic in 2 weeks for toxicity check and discussion a PET/CT scan.  All questions were answered. The patient knows to call the clinic with any problems, questions or concerns.    Rulon Eisenmenger, MD 12/10/16

## 2016-12-10 NOTE — Progress Notes (Signed)
Spoke with pathology at [facility] to request foundation 1 to be sent out from pt peritoneal biopsy that was recently obtained. Spoke with Suanne Marker.

## 2016-12-11 LAB — CA 125: Cancer Antigen (CA) 125: 27.6 U/mL (ref 0.0–38.1)

## 2016-12-11 LAB — CANCER ANTIGEN 27.29: CAN 27.29: 84.7 U/mL — AB (ref 0.0–38.6)

## 2016-12-12 ENCOUNTER — Telehealth: Payer: Self-pay

## 2016-12-12 NOTE — Telephone Encounter (Signed)
lvm that Dr Lindi Adie not available this afternoon. We will call on Tuesday or she may call us back on Tuesday. Apologies for delay in answering given.

## 2016-12-12 NOTE — Telephone Encounter (Signed)
Pt called asking if she should get a flu shot this weekend b/c she is starting chemo soon. She had called earlier today and message was forwarded.

## 2016-12-13 ENCOUNTER — Ambulatory Visit (HOSPITAL_COMMUNITY)
Admission: RE | Admit: 2016-12-13 | Discharge: 2016-12-13 | Disposition: A | Discharge status: Home / Self Care | Payer: Medicare Other | Source: Ambulatory Visit | Attending: Hematology and Oncology | Admitting: Hematology and Oncology

## 2016-12-13 DIAGNOSIS — C786 Secondary malignant neoplasm of retroperitoneum and peritoneum: Secondary | ICD-10-CM | POA: Diagnosis not present

## 2016-12-13 DIAGNOSIS — R188 Other ascites: Secondary | ICD-10-CM | POA: Insufficient documentation

## 2016-12-13 DIAGNOSIS — J90 Pleural effusion, not elsewhere classified: Secondary | ICD-10-CM | POA: Diagnosis not present

## 2016-12-13 DIAGNOSIS — C7951 Secondary malignant neoplasm of bone: Secondary | ICD-10-CM | POA: Insufficient documentation

## 2016-12-13 DIAGNOSIS — C7972 Secondary malignant neoplasm of left adrenal gland: Secondary | ICD-10-CM | POA: Insufficient documentation

## 2016-12-13 DIAGNOSIS — C50919 Malignant neoplasm of unspecified site of unspecified female breast: Secondary | ICD-10-CM | POA: Insufficient documentation

## 2016-12-13 DIAGNOSIS — C771 Secondary and unspecified malignant neoplasm of intrathoracic lymph nodes: Secondary | ICD-10-CM | POA: Diagnosis not present

## 2016-12-13 LAB — GLUCOSE, CAPILLARY: Glucose-Capillary: 107 mg/dL — ABNORMAL HIGH (ref 65–99)

## 2016-12-13 MED ORDER — FLUDEOXYGLUCOSE F - 18 (FDG) INJECTION
6.8000 | Freq: Once | INTRAVENOUS | Status: AC | PRN
  Administered 2016-12-13: 6.8 via INTRAVENOUS

## 2016-12-16 ENCOUNTER — Telehealth: Payer: Self-pay | Admitting: Hematology and Oncology

## 2016-12-16 NOTE — Telephone Encounter (Signed)
Will call pt today

## 2016-12-16 NOTE — Telephone Encounter (Signed)
I called the patient and left a message that the PET/CT scan showed multiple small bone metastases in thoracolumbar spine as well as the peritoneal carcinomatosis. In addition to mediastinal lymph nodes and left axillary lymph nodes.

## 2016-12-17 ENCOUNTER — Telehealth: Payer: Self-pay

## 2016-12-17 NOTE — Telephone Encounter (Signed)
Spoke with pt regarding her concerns for getting flu shot this season. Told pt okay to get flu shot. Pt also wanted to notify Dr.Gudena that pt will be seeing Dr. Jonelle Sidle Trainer in [place] ([facility]) on 12/30/16. At this time, pt would like to seek a 2nd opinion prior to making a decision on chemotherapy. Dr.Gudena is aware of pt decision. No further concerns at this time.

## 2016-12-18 NOTE — Telephone Encounter (Signed)
Pt requested a call back. Attempted x3

## 2016-12-19 ENCOUNTER — Telehealth: Payer: Self-pay | Admitting: Hematology and Oncology

## 2016-12-19 NOTE — Telephone Encounter (Signed)
Faxed records [employer] [phone]

## 2016-12-31 ENCOUNTER — Encounter (HOSPITAL_COMMUNITY): Payer: Self-pay

## 2017-01-01 ENCOUNTER — Other Ambulatory Visit: Payer: Self-pay | Admitting: Oncology

## 2017-01-01 MED ORDER — LETROZOLE 2.5 MG PO TABS: 2.5000 mg | ORAL_TABLET | Freq: Every day | ORAL | 3 refills | Status: DC

## 2017-01-02 ENCOUNTER — Ambulatory Visit: Admit: 2017-01-02 | Discharge: 2017-01-02 | Disposition: A | Discharge status: Home / Self Care | Payer: MEDICARE

## 2017-01-02 DIAGNOSIS — C50919 Malignant neoplasm of unspecified site of unspecified female breast: Principal | ICD-10-CM

## 2017-01-05 ENCOUNTER — Ambulatory Visit: Admit: 2017-01-05 | Discharge: 2017-01-05 | Disposition: A | Discharge status: Home / Self Care | Payer: MEDICARE

## 2017-01-05 DIAGNOSIS — C801 Malignant (primary) neoplasm, unspecified: Principal | ICD-10-CM

## 2017-01-06 ENCOUNTER — Ambulatory Visit
Admission: RE | Admit: 2017-01-06 | Discharge: 2017-01-06 | Disposition: A | Discharge status: Home / Self Care | Payer: MEDICARE | Attending: Hematology & Oncology | Admitting: Hematology & Oncology

## 2017-01-06 DIAGNOSIS — C50911 Malignant neoplasm of unspecified site of right female breast: Principal | ICD-10-CM

## 2017-01-13 DIAGNOSIS — C50919 Malignant neoplasm of unspecified site of unspecified female breast: Secondary | ICD-10-CM | POA: Insufficient documentation

## 2017-01-14 ENCOUNTER — Ambulatory Visit
Admission: RE | Admit: 2017-01-14 | Discharge: 2017-01-14 | Disposition: A | Discharge status: Home / Self Care | Payer: MEDICARE | Attending: Hematology & Oncology | Admitting: Hematology & Oncology

## 2017-01-14 DIAGNOSIS — C50612 Malignant neoplasm of axillary tail of left female breast: Secondary | ICD-10-CM

## 2017-01-14 DIAGNOSIS — C50919 Malignant neoplasm of unspecified site of unspecified female breast: Principal | ICD-10-CM

## 2017-01-14 DIAGNOSIS — Z170 Estrogen receptor positive status [ER+]: Secondary | ICD-10-CM

## 2017-01-15 MED ORDER — PALBOCICLIB 125 MG CAPSULE
Freq: Every day | ORAL | 5 refills | 0.00000 days | Status: CP
Start: 2017-01-15 — End: 2017-01-15

## 2017-01-15 MED ORDER — PALBOCICLIB 125 MG CAPSULE: each | 5 refills | 0 days

## 2017-01-16 ENCOUNTER — Ambulatory Visit
Admission: RE | Admit: 2017-01-16 | Discharge: 2017-01-17 | Disposition: A | Discharge status: Home / Self Care | Payer: MEDICARE

## 2017-01-16 DIAGNOSIS — C50919 Malignant neoplasm of unspecified site of unspecified female breast: Secondary | ICD-10-CM

## 2017-01-16 DIAGNOSIS — Z170 Estrogen receptor positive status [ER+]: Secondary | ICD-10-CM

## 2017-01-16 DIAGNOSIS — C50612 Malignant neoplasm of axillary tail of left female breast: Principal | ICD-10-CM

## 2017-01-19 NOTE — Unmapped (Signed)
Per test claim for [facility] 125MG  CAPS at the [facility], patient needs Medication Assistance Program for Prior Authorization.

## 2017-01-20 NOTE — Unmapped (Signed)
Susan Robbins has a high copay for Susan Robbins and will require assistance.  Application for manufacturer assistance and instructions have been emailed to Susan Robbins.  She is to complete the application and return it to me.  Once I receive the documents, I will fax to [employer].

## 2017-01-23 NOTE — Unmapped (Signed)
Arrangements have been made for Ms. Mathisen to get her first fill of palbociclib from the [facility] on 01/27/17 with a one-time voucher card.  Application for manufacturer assistance for palbociclib from [employer] has been completed and faxed to [employer] today.    Initiation of Therapy Assessment:   -Complete medication list reviewed.  No issues were identified  -Past medical history reviewed.  -Allergies were reviewed  -Relevant cultural assessment and health literacy was completed.  Patient is able to store his/her medication as directed  -This patient does not have any cognitive or physical disabilities   -This patient does not speak any other languages.   - Shipping address was verified  - Copay and delivery date confirmed with patient     The following was explained to the patient:  Advised patient of the following:  -A Welcome packet will be sent to the patient   -Assignment of Benefit for the patient to review and return before next refill  -Copay or out-of-pocket responsibility  -Arrangement of payment method can be done by contacting the pharmacy  -Take medications with during travel, have doctor's appointments, or if being admitted to the hospital.  Advised patient of refill order process:  -Pharmacy must speak to the patient every month to receive medication  -Patient may call the pharmacy, or the pharmacy will call about a week before the refill is due    Patient verbalized understanding of the above information.

## 2017-01-26 MED FILL — IBRANCE/125MG/CAPS: IBRANCE/125MG/CAPS | 21 days supply | Qty: 21 | Fill #0

## 2017-01-29 ENCOUNTER — Ambulatory Visit
Admission: RE | Admit: 2017-01-29 | Discharge: 2017-01-29 | Disposition: A | Discharge status: Home / Self Care | Payer: MEDICARE

## 2017-01-29 ENCOUNTER — Ambulatory Visit
Admission: RE | Admit: 2017-01-29 | Discharge: 2017-01-29 | Disposition: A | Discharge status: Home / Self Care | Payer: MEDICARE | Attending: Adult Health | Admitting: Adult Health

## 2017-01-29 DIAGNOSIS — C50919 Malignant neoplasm of unspecified site of unspecified female breast: Principal | ICD-10-CM

## 2017-01-29 LAB — COMPREHENSIVE METABOLIC PANEL
ALBUMIN: 4.2 g/dL (ref 3.5–5.0)
ALKALINE PHOSPHATASE: 53 U/L (ref 38–126)
ALT (SGPT): 25 U/L (ref 15–48)
ANION GAP: 10 mmol/L (ref 9–15)
AST (SGOT): 24 U/L (ref 14–38)
BILIRUBIN TOTAL: 0.7 mg/dL (ref 0.0–1.2)
BLOOD UREA NITROGEN: 15 mg/dL (ref 7–21)
CALCIUM: 9.3 mg/dL (ref 8.5–10.2)
CHLORIDE: 98 mmol/L (ref 98–107)
CO2: 31 mmol/L — ABNORMAL HIGH (ref 22.0–30.0)
CREATININE: 0.83 mg/dL (ref 0.60–1.00)
EGFR MDRD AF AMER: >=60 mL/min/{1.73_m2} (ref >=60)
EGFR MDRD NON AF AMER: >=60 mL/min/{1.73_m2} (ref >=60)
POTASSIUM: 4.5 mmol/L (ref 3.5–5.0)
PROTEIN TOTAL: 7.4 g/dL (ref 6.5–8.3)
SODIUM: 139 mmol/L (ref 135–145)

## 2017-01-29 LAB — CBC W/ AUTO DIFF
BASOPHILS ABSOLUTE COUNT: 0 10*9/L (ref 0.0–0.1)
HEMOGLOBIN: 14.6 g/dL (ref 12.0–16.0)
LARGE UNSTAINED CELLS: 2 % (ref 0–4)
LYMPHOCYTES ABSOLUTE COUNT: 1.1 10*9/L — ABNORMAL LOW (ref 1.5–5.0)
MEAN CORPUSCULAR HEMOGLOBIN CONC: 34.9 g/dL (ref 31.0–37.0)
MEAN CORPUSCULAR VOLUME: 85.6 fL (ref 80.0–100.0)
MEAN PLATELET VOLUME: 7.1 fL (ref 7.0–10.0)
MONOCYTES ABSOLUTE COUNT: 0.2 10*9/L (ref 0.2–0.8)
NEUTROPHILS ABSOLUTE COUNT: 2.7 10*9/L (ref 2.0–7.5)
PLATELET COUNT: 244 10*9/L (ref 150–440)
RED BLOOD CELL COUNT: 4.89 10*12/L (ref 4.00–5.20)
RED CELL DISTRIBUTION WIDTH: 12.9 % (ref 12.0–15.0)
WBC ADJUSTED: 4.1 10*9/L — ABNORMAL LOW (ref 4.5–11.0)

## 2017-01-29 LAB — AST (SGOT): Aspartate aminotransferase:CCnc:Pt:Ser/Plas:Qn:: 24

## 2017-01-29 LAB — RED BLOOD CELL COUNT: Lab: 4.89

## 2017-01-29 LAB — PHOSPHORUS
PHOSPHORUS: 4 mg/dL (ref 2.9–4.7)
Phosphate:MCnc:Pt:Ser/Plas:Qn:: 4

## 2017-01-29 MED ORDER — LETROZOLE 2.5 MG TABLET
ORAL_TABLET | Freq: Every day | ORAL | 11 refills | 0 days | Status: CP
Start: 2017-01-29 — End: 2017-06-18

## 2017-01-29 NOTE — Unmapped (Signed)
Labs drawn and sent for analysis. Care provided by Alla Feeling RN

## 2017-01-30 MED ORDER — PALBOCICLIB 125 MG CAPSULE
ORAL_CAPSULE | Freq: Every day | ORAL | 5 refills | 0 days | Status: CP
Start: 2017-01-30 — End: 2017-05-21

## 2017-01-30 NOTE — Unmapped (Signed)
Pharmacist Oral Chemotherapy Follow-Up    Ms. Susan Robbins is a 66 y.o. female with metastatic breast cancer who I am following while on oral chemotherapy    Oral Chemotherapy: Palbociclib 125 mg po daily x 21 days then 7 days off  Start date: 01/29/2017  Pharmacy:  [facility]    Ms. Susan Robbins started palbociclib on  10/18.  She reports doing well with her first two doses.  [employer] has denied her manufacturer assistance for palbociclib due to income.  She requests that her prescription be sent to [facility].  Prescription sent to [facility] today.  She verbalized understanding to call me with any questions or concerns.

## 2017-02-11 ENCOUNTER — Ambulatory Visit
Admission: RE | Admit: 2017-02-11 | Discharge: 2017-02-11 | Disposition: A | Discharge status: Home / Self Care | Payer: MEDICARE | Attending: Adult Health | Admitting: Adult Health

## 2017-02-11 ENCOUNTER — Ambulatory Visit
Admission: RE | Admit: 2017-02-11 | Discharge: 2017-02-11 | Disposition: A | Discharge status: Home / Self Care | Payer: MEDICARE

## 2017-02-11 DIAGNOSIS — Z170 Estrogen receptor positive status [ER+]: Secondary | ICD-10-CM

## 2017-02-11 DIAGNOSIS — C50612 Malignant neoplasm of axillary tail of left female breast: Principal | ICD-10-CM

## 2017-02-11 MED ORDER — HYDROXYZINE HCL 25 MG TABLET
ORAL_TABLET | Freq: Three times a day (TID) | ORAL | 3 refills | 0 days | Status: CP | PRN
Start: 2017-02-11 — End: 2018-08-04

## 2017-02-26 ENCOUNTER — Ambulatory Visit
Admission: RE | Admit: 2017-02-26 | Discharge: 2017-02-26 | Disposition: A | Discharge status: Home / Self Care | Payer: MEDICARE

## 2017-02-26 ENCOUNTER — Ambulatory Visit
Admission: RE | Admit: 2017-02-26 | Discharge: 2017-02-26 | Disposition: A | Discharge status: Home / Self Care | Payer: MEDICARE | Attending: Adult Health | Admitting: Adult Health

## 2017-02-26 DIAGNOSIS — C50919 Malignant neoplasm of unspecified site of unspecified female breast: Principal | ICD-10-CM

## 2017-02-26 DIAGNOSIS — C50912 Malignant neoplasm of unspecified site of left female breast: Secondary | ICD-10-CM

## 2017-03-11 ENCOUNTER — Ambulatory Visit
Admission: RE | Admit: 2017-03-11 | Discharge: 2017-03-11 | Disposition: A | Discharge status: Home / Self Care | Payer: MEDICARE

## 2017-03-11 DIAGNOSIS — C50612 Malignant neoplasm of axillary tail of left female breast: Principal | ICD-10-CM

## 2017-03-11 DIAGNOSIS — Z170 Estrogen receptor positive status [ER+]: Secondary | ICD-10-CM

## 2017-03-26 ENCOUNTER — Ambulatory Visit
Admission: RE | Admit: 2017-03-26 | Discharge: 2017-03-26 | Disposition: A | Discharge status: Home / Self Care | Payer: MEDICARE | Attending: Adult Health | Admitting: Adult Health

## 2017-03-26 ENCOUNTER — Ambulatory Visit
Admission: RE | Admit: 2017-03-26 | Discharge: 2017-03-26 | Disposition: A | Discharge status: Home / Self Care | Payer: MEDICARE

## 2017-03-26 DIAGNOSIS — C50919 Malignant neoplasm of unspecified site of unspecified female breast: Principal | ICD-10-CM

## 2017-03-27 ENCOUNTER — Telehealth: Payer: Self-pay

## 2017-03-27 NOTE — Telephone Encounter (Signed)
I spoke with patient to see about scheduling an appointment for office visit and prolia. Pt stated that she will no longer be taking the prolia injections at the office. She is now receiving treatment through oncology.

## 2017-04-03 ENCOUNTER — Encounter: Payer: Self-pay | Admitting: Internal Medicine

## 2017-04-03 ENCOUNTER — Other Ambulatory Visit: Payer: Self-pay | Admitting: Internal Medicine

## 2017-04-03 DIAGNOSIS — B002 Herpesviral gingivostomatitis and pharyngotonsillitis: Secondary | ICD-10-CM

## 2017-04-03 DIAGNOSIS — E559 Vitamin D deficiency, unspecified: Secondary | ICD-10-CM | POA: Insufficient documentation

## 2017-04-03 MED ORDER — VALACYCLOVIR HCL 1 G PO TABS: 1000.0000 mg | ORAL_TABLET | Freq: Two times a day (BID) | ORAL | 3 refills | Status: AC

## 2017-04-03 NOTE — Progress Notes (Signed)
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 They have responded to Valtrex in the past and she asks for renewal of this medication.

## 2017-04-22 ENCOUNTER — Ambulatory Visit: Admit: 2017-04-22 | Discharge: 2017-05-05 | Payer: MEDICARE

## 2017-04-22 ENCOUNTER — Other Ambulatory Visit: Admit: 2017-04-22 | Discharge: 2017-05-05 | Payer: MEDICARE

## 2017-04-22 DIAGNOSIS — C50919 Malignant neoplasm of unspecified site of unspecified female breast: Principal | ICD-10-CM

## 2017-04-22 DIAGNOSIS — C50912 Malignant neoplasm of unspecified site of left female breast: Secondary | ICD-10-CM

## 2017-04-23 ENCOUNTER — Ambulatory Visit: Admit: 2017-04-23 | Discharge: 2017-04-23 | Payer: MEDICARE | Attending: Adult Health | Primary: Adult Health

## 2017-04-23 DIAGNOSIS — C50919 Malignant neoplasm of unspecified site of unspecified female breast: Principal | ICD-10-CM

## 2017-05-21 ENCOUNTER — Other Ambulatory Visit: Admit: 2017-05-21 | Discharge: 2017-05-22 | Payer: MEDICARE

## 2017-05-21 ENCOUNTER — Ambulatory Visit
Admit: 2017-05-21 | Discharge: 2017-05-22 | Payer: MEDICARE | Attending: Hematology & Oncology | Primary: Hematology & Oncology

## 2017-05-21 DIAGNOSIS — C50919 Malignant neoplasm of unspecified site of unspecified female breast: Principal | ICD-10-CM

## 2017-05-21 MED ORDER — PALBOCICLIB 100 MG CAPSULE
ORAL_CAPSULE | Freq: Every day | ORAL | 5 refills | 0 days | Status: CP
Start: 2017-05-21 — End: 2017-06-20

## 2017-06-18 ENCOUNTER — Other Ambulatory Visit: Admit: 2017-06-18 | Discharge: 2017-06-19 | Payer: MEDICARE

## 2017-06-18 ENCOUNTER — Ambulatory Visit: Admit: 2017-06-18 | Discharge: 2017-06-19 | Payer: MEDICARE | Attending: Adult Health | Primary: Adult Health

## 2017-06-18 DIAGNOSIS — C50919 Malignant neoplasm of unspecified site of unspecified female breast: Principal | ICD-10-CM

## 2017-06-18 MED ORDER — LETROZOLE 2.5 MG TABLET
ORAL_TABLET | Freq: Every day | ORAL | 3 refills | 0 days | Status: CP
Start: 2017-06-18 — End: 2017-09-25

## 2017-06-24 ENCOUNTER — Telehealth: Payer: Self-pay

## 2017-06-24 NOTE — Telephone Encounter (Signed)
I called patient to inquire if she was still a patient at [facility], last ov 2017.  Patient states her medical care is in [place] and she is no longer a patient here.

## 2017-07-15 ENCOUNTER — Other Ambulatory Visit: Admit: 2017-07-15 | Discharge: 2017-08-13 | Payer: MEDICARE

## 2017-07-15 ENCOUNTER — Ambulatory Visit: Admit: 2017-07-15 | Discharge: 2017-08-13 | Payer: MEDICARE

## 2017-07-15 ENCOUNTER — Ambulatory Visit: Admit: 2017-07-15 | Discharge: 2017-07-28 | Payer: MEDICARE

## 2017-07-15 DIAGNOSIS — C50912 Malignant neoplasm of unspecified site of left female breast: Principal | ICD-10-CM

## 2017-07-15 DIAGNOSIS — C50919 Malignant neoplasm of unspecified site of unspecified female breast: Secondary | ICD-10-CM

## 2017-07-16 ENCOUNTER — Ambulatory Visit
Admit: 2017-07-16 | Discharge: 2017-07-17 | Payer: MEDICARE | Attending: Hematology & Oncology | Primary: Hematology & Oncology

## 2017-07-16 DIAGNOSIS — C50919 Malignant neoplasm of unspecified site of unspecified female breast: Principal | ICD-10-CM

## 2017-08-12 ENCOUNTER — Ambulatory Visit: Admit: 2017-08-12 | Discharge: 2017-08-13 | Payer: MEDICARE | Attending: Adult Health | Primary: Adult Health

## 2017-08-12 ENCOUNTER — Ambulatory Visit: Admit: 2017-08-12 | Discharge: 2017-08-13 | Payer: MEDICARE

## 2017-08-12 ENCOUNTER — Other Ambulatory Visit: Admit: 2017-08-12 | Discharge: 2017-08-13 | Payer: MEDICARE

## 2017-08-12 DIAGNOSIS — C50511 Malignant neoplasm of lower-outer quadrant of right female breast: Secondary | ICD-10-CM

## 2017-08-12 DIAGNOSIS — C50919 Malignant neoplasm of unspecified site of unspecified female breast: Secondary | ICD-10-CM

## 2017-08-12 DIAGNOSIS — Z170 Estrogen receptor positive status [ER+]: Secondary | ICD-10-CM

## 2017-08-12 MED ORDER — CYCLOBENZAPRINE 5 MG TABLET: 5 mg | tablet | 0 refills | 0 days

## 2017-08-12 MED ORDER — CYCLOBENZAPRINE 5 MG TABLET
ORAL_TABLET | Freq: Three times a day (TID) | ORAL | 0 refills | 0.00000 days | Status: CP | PRN
Start: 2017-08-12 — End: 2017-11-02

## 2017-08-24 IMAGING — CT CT ABD-PELV W/ CM
2 of 5 series · 15 of 46 positions shown, 17 images · IV contrast (ISOVUE)
Comparison: 09/20/2013

CLINICAL DATA: Recent diarrhea and bright red blood per rectum,
initial encounter

EXAM:
CT ABDOMEN AND PELVIS WITH CONTRAST
TECHNIQUE: Multidetector CT imaging of the abdomen and pelvis was performed
using the standard protocol following bolus administration of
intravenous contrast.
CONTRAST:  <See Chart> XTD1VF-2SS IOPAMIDOL (XTD1VF-2SS) INJECTION
61%

[Series 2: abd/pel with · axial · 0.79mm/px · z∈[-310,+76]mm · 12 of 89 slices shown, 14 images]
[im 6/89  soft-tissue]
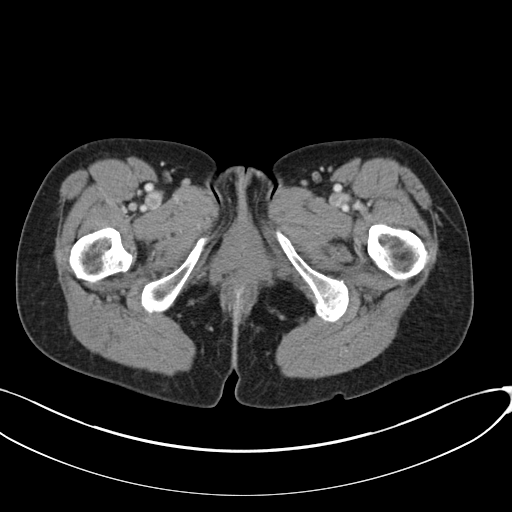
[im 6/89  bone]
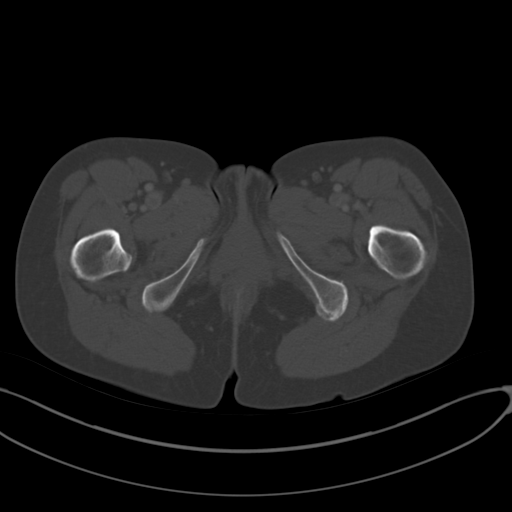
[im 12/89  soft-tissue]
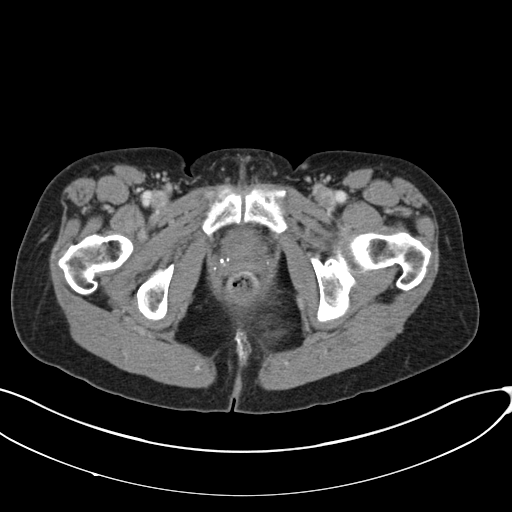
[im 23/89  soft-tissue]
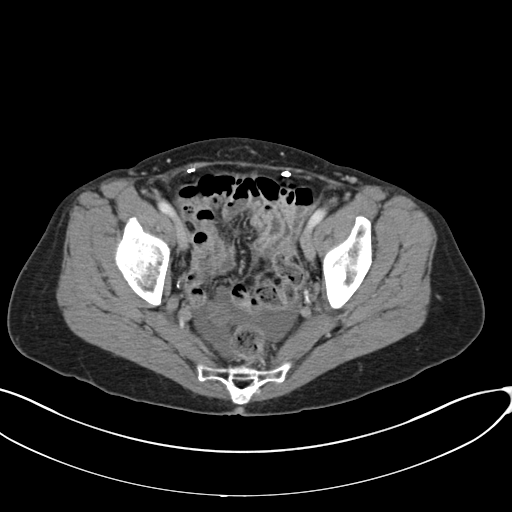
[im 28/89  soft-tissue]
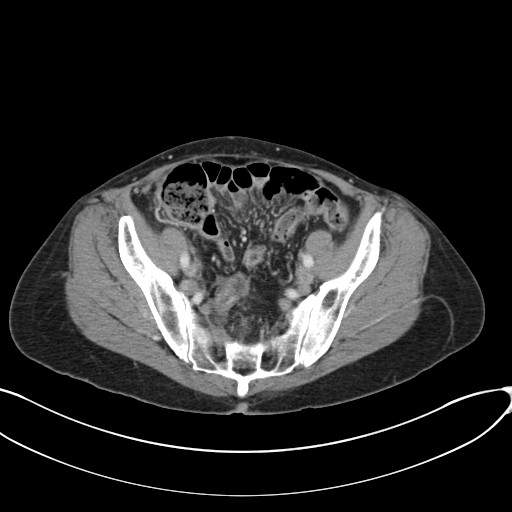
[im 34/89  soft-tissue]
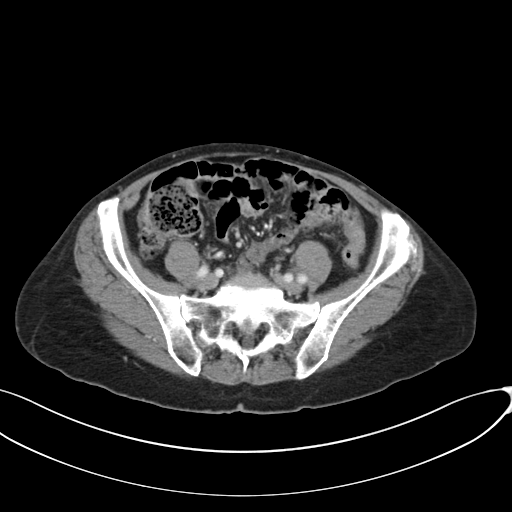
[im 39/89  soft-tissue]
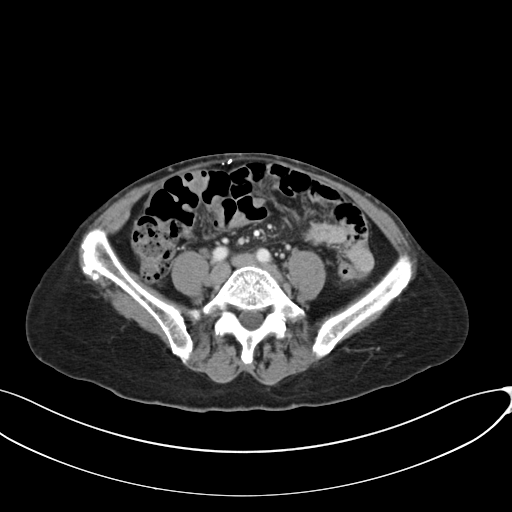
[im 50/89  soft-tissue]
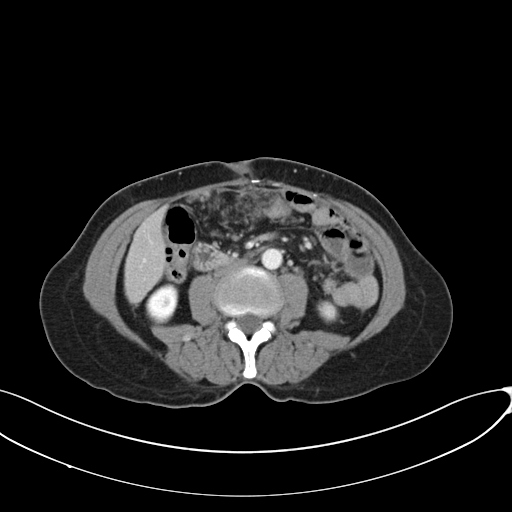
[im 56/89  soft-tissue]
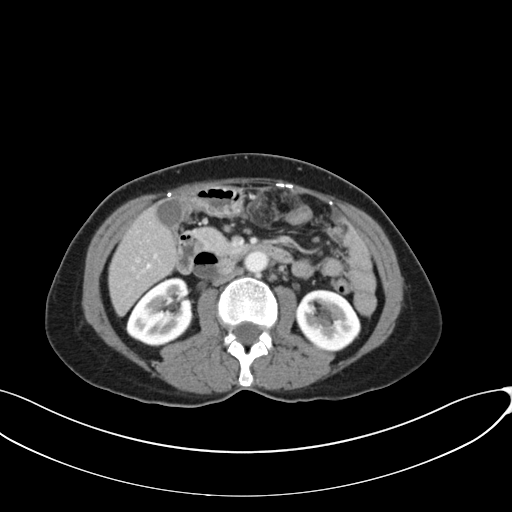
[im 61/89  soft-tissue]
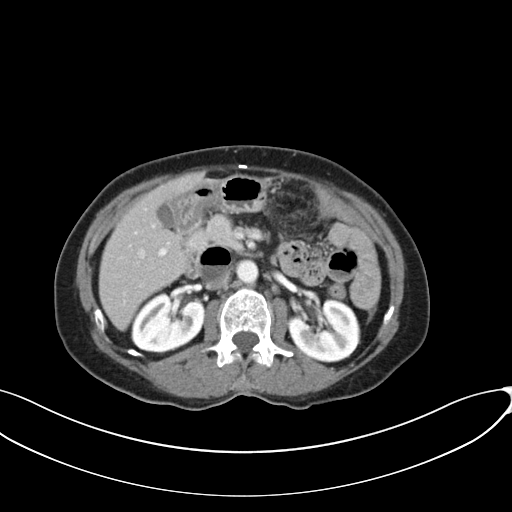
[im 61/89  bone]
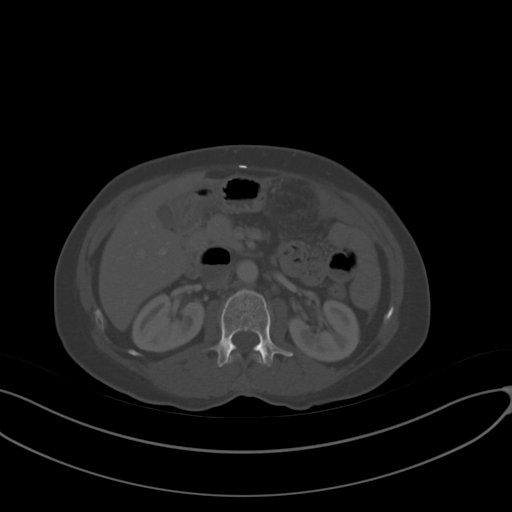
[im 67/89  soft-tissue]
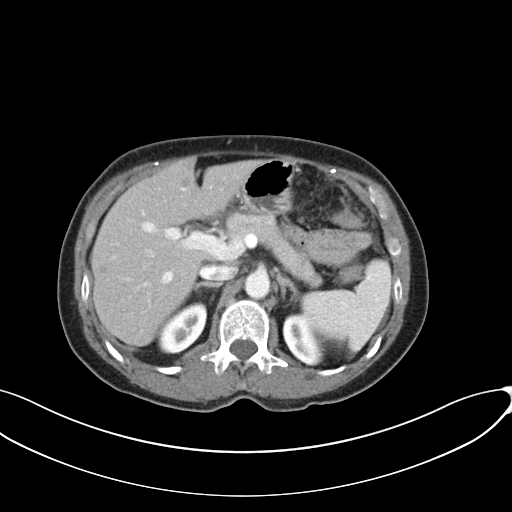
[im 78/89  soft-tissue]
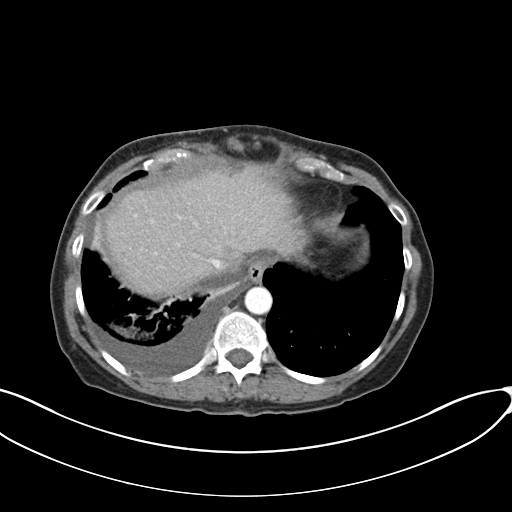
[im 83/89  soft-tissue]
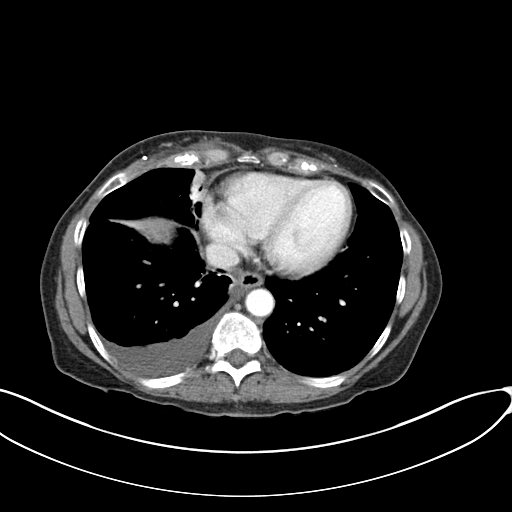

[Series 4: coronal a/|p · coronal · 0.74mm/px · 3 of 107 slices shown]
[im 36/107  soft-tissue]
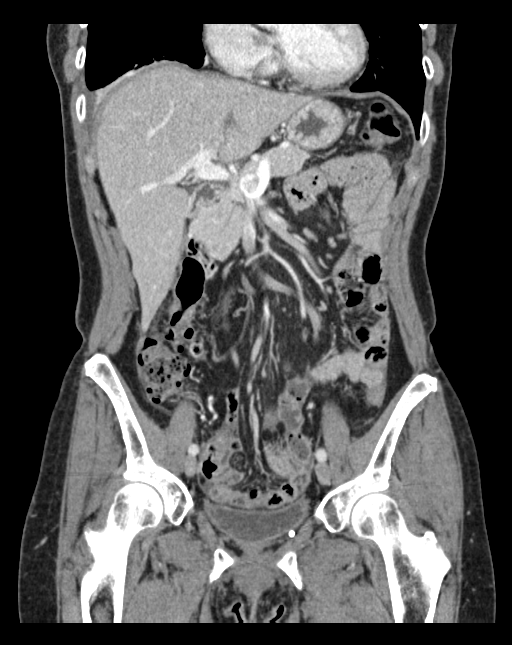
[im 48/107  soft-tissue]
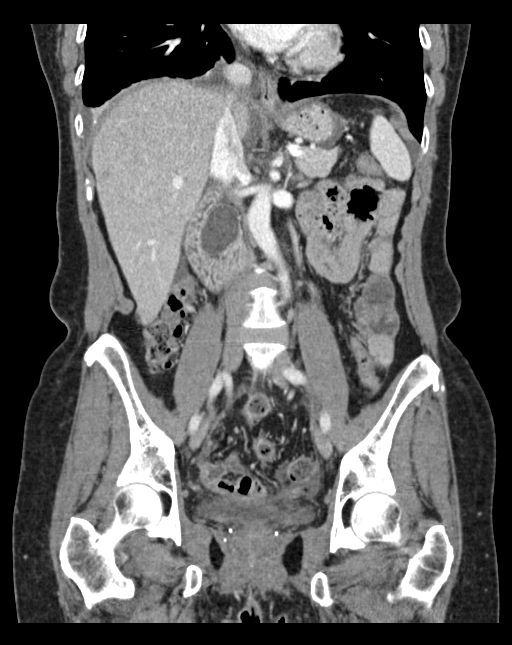
[im 59/107  soft-tissue]
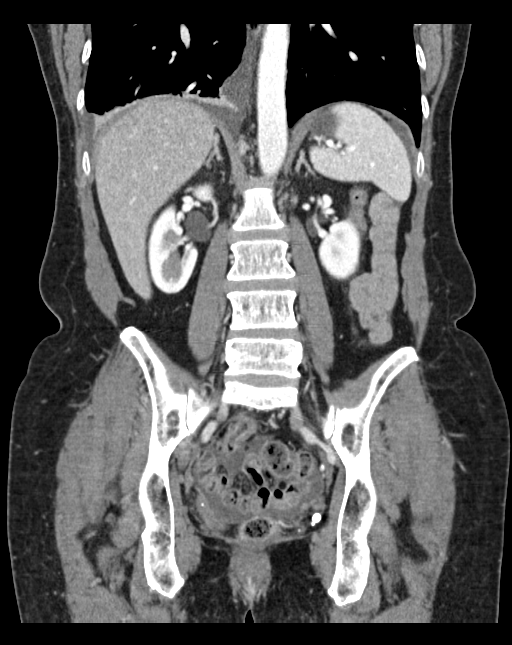

[15 of 46 positions shown; findings below may reference images not displayed]

FINDINGS: Lower chest: Left lung base is within normal limits. A small right
pleural effusion is noted with some associated middle lobe and lower
lobe atelectatic change.

Hepatobiliary: Scattered hypodensities are noted throughout the
liver. One of these present in the posterior aspect of the right
lobe of the liver was present on the prior exam and consistent with
a cyst. A few vague areas are noted both in the left and right lobes
of the liver on image number 17 of series 2 and also image number 23
of series 2. Although these may represent cysts or small hemangiomas
the possibility of more aggressive process would deserve
consideration. The gallbladder is within normal limits.

Pancreas: Unremarkable. No pancreatic ductal dilatation or
surrounding inflammatory changes.

Spleen: Normal in size without focal abnormality.

Adrenals/Urinary Tract: The adrenal glands are within normal limits.
The kidneys are well visualized bilaterally with mild extrarenal
pelves. No true obstructive changes are seen. Bladder is
decompressed.

Stomach/Bowel: Diverticular change of the colon is noted without
evidence of diverticulitis. No obstructive changes are seen. The
appendix is not well visualized. No inflammatory changes to suggest
appendicitis are noted.

Vascular/Lymphatic: No significant vascular abnormality is noted. No
significant lymphadenopathy is seen.

Reproductive: The uterus has been surgically removed consistent with
the patient's given clinical history.

Other: There is mild ascites identified as well as significant soft
tissue density throughout the omentum. This along with some
peritoneal enhancement along the right pelvic wall is consistent
with peritoneal carcinomatosis.

Musculoskeletal: Degenerative changes of the lumbar spine are noted.
No acute bony abnormality is seen.
IMPRESSION: There are changes consistent with peritoneal carcinomatosis with
peritoneal thickening and enhancement as well as mild ascites.

Vague hypodensities within the liver as described above suspicious
for metastatic disease. Nonemergent further workup is recommended as
indicated.

Postsurgical changes.

## 2017-09-09 ENCOUNTER — Ambulatory Visit: Admit: 2017-09-09 | Discharge: 2017-09-09 | Payer: MEDICARE | Attending: Adult Health | Primary: Adult Health

## 2017-09-09 ENCOUNTER — Other Ambulatory Visit: Admit: 2017-09-09 | Discharge: 2017-09-09 | Payer: MEDICARE

## 2017-09-09 DIAGNOSIS — C50919 Malignant neoplasm of unspecified site of unspecified female breast: Principal | ICD-10-CM

## 2017-09-09 MED FILL — CYCLOBENZAPRINE HCL/5MG/TABS: CYCLOBENZAPRINE HCL/5MG/TABS | 6 days supply | Qty: 20 | Fill #0

## 2017-09-25 MED ORDER — TAMOXIFEN 20 MG TABLET
ORAL_TABLET | Freq: Every day | ORAL | 3 refills | 0 days | Status: CP
Start: 2017-09-25 — End: 2017-11-05

## 2017-10-07 ENCOUNTER — Other Ambulatory Visit: Admit: 2017-10-07 | Discharge: 2017-10-07 | Payer: MEDICARE

## 2017-10-07 ENCOUNTER — Ambulatory Visit: Admit: 2017-10-07 | Discharge: 2017-10-07 | Payer: MEDICARE

## 2017-10-07 DIAGNOSIS — C50511 Malignant neoplasm of lower-outer quadrant of right female breast: Principal | ICD-10-CM

## 2017-10-07 DIAGNOSIS — C50919 Malignant neoplasm of unspecified site of unspecified female breast: Principal | ICD-10-CM

## 2017-10-07 DIAGNOSIS — Z170 Estrogen receptor positive status [ER+]: Secondary | ICD-10-CM

## 2017-10-08 ENCOUNTER — Ambulatory Visit: Admit: 2017-10-08 | Discharge: 2017-10-09 | Payer: MEDICARE | Attending: Adult Health | Primary: Adult Health

## 2017-10-08 ENCOUNTER — Ambulatory Visit: Admit: 2017-10-08 | Discharge: 2017-10-09 | Payer: MEDICARE

## 2017-10-08 DIAGNOSIS — C50919 Malignant neoplasm of unspecified site of unspecified female breast: Secondary | ICD-10-CM

## 2017-10-08 DIAGNOSIS — C50511 Malignant neoplasm of lower-outer quadrant of right female breast: Secondary | ICD-10-CM

## 2017-10-08 DIAGNOSIS — Z170 Estrogen receptor positive status [ER+]: Secondary | ICD-10-CM

## 2017-11-05 ENCOUNTER — Ambulatory Visit: Admit: 2017-11-05 | Discharge: 2017-11-06 | Payer: MEDICARE | Attending: Adult Health | Primary: Adult Health

## 2017-11-05 ENCOUNTER — Other Ambulatory Visit: Admit: 2017-11-05 | Discharge: 2017-11-06 | Payer: MEDICARE

## 2017-11-05 DIAGNOSIS — C50919 Malignant neoplasm of unspecified site of unspecified female breast: Principal | ICD-10-CM

## 2017-11-05 MED ORDER — CYCLOBENZAPRINE 5 MG TABLET
ORAL_TABLET | Freq: Three times a day (TID) | ORAL | 0 refills | 0.00000 days | Status: CP | PRN
Start: 2017-11-05 — End: 2017-12-28

## 2017-11-07 ENCOUNTER — Other Ambulatory Visit: Payer: Self-pay | Admitting: Internal Medicine

## 2017-11-07 ENCOUNTER — Encounter: Payer: Self-pay | Admitting: Internal Medicine

## 2017-11-07 DIAGNOSIS — J040 Acute laryngitis: Secondary | ICD-10-CM

## 2017-11-07 MED ORDER — AMOXICILLIN 500 MG PO CAPS: 500.0000 mg | ORAL_CAPSULE | Freq: Three times a day (TID) | ORAL | 0 refills | Status: DC

## 2017-12-02 ENCOUNTER — Ambulatory Visit: Admit: 2017-12-02 | Discharge: 2017-12-04 | Payer: MEDICARE | Attending: Adult Health | Primary: Adult Health

## 2017-12-02 ENCOUNTER — Ambulatory Visit: Admit: 2017-12-02 | Discharge: 2017-12-04 | Payer: MEDICARE

## 2017-12-02 ENCOUNTER — Ambulatory Visit: Admit: 2017-12-02 | Discharge: 2017-12-15 | Payer: MEDICARE

## 2017-12-02 ENCOUNTER — Other Ambulatory Visit: Admit: 2017-12-02 | Discharge: 2017-12-04 | Payer: MEDICARE

## 2017-12-02 DIAGNOSIS — C50919 Malignant neoplasm of unspecified site of unspecified female breast: Principal | ICD-10-CM

## 2017-12-02 DIAGNOSIS — C50511 Malignant neoplasm of lower-outer quadrant of right female breast: Principal | ICD-10-CM

## 2017-12-02 DIAGNOSIS — Z170 Estrogen receptor positive status [ER+]: Secondary | ICD-10-CM

## 2017-12-02 DIAGNOSIS — C50912 Malignant neoplasm of unspecified site of left female breast: Secondary | ICD-10-CM

## 2017-12-30 MED ORDER — CYCLOBENZAPRINE 5 MG TABLET
ORAL_TABLET | Freq: Three times a day (TID) | ORAL | 0 refills | 0 days | Status: CP | PRN
Start: 2017-12-30 — End: 2018-02-04

## 2018-01-01 ENCOUNTER — Ambulatory Visit: Admit: 2018-01-01 | Discharge: 2018-01-01 | Payer: MEDICARE | Attending: Adult Health | Primary: Adult Health

## 2018-01-01 ENCOUNTER — Other Ambulatory Visit: Admit: 2018-01-01 | Discharge: 2018-01-01 | Payer: MEDICARE

## 2018-01-01 DIAGNOSIS — C50919 Malignant neoplasm of unspecified site of unspecified female breast: Principal | ICD-10-CM

## 2018-02-02 ENCOUNTER — Other Ambulatory Visit: Admit: 2018-02-02 | Discharge: 2018-02-02 | Payer: MEDICARE

## 2018-02-02 ENCOUNTER — Institutional Professional Consult (permissible substitution): Admit: 2018-02-02 | Discharge: 2018-02-02 | Payer: MEDICARE

## 2018-02-02 DIAGNOSIS — C50919 Malignant neoplasm of unspecified site of unspecified female breast: Principal | ICD-10-CM

## 2018-02-05 MED ORDER — CYCLOBENZAPRINE 5 MG TABLET
ORAL_TABLET | Freq: Three times a day (TID) | ORAL | 0 refills | 0 days | Status: CP | PRN
Start: 2018-02-05 — End: 2018-04-28

## 2018-03-03 ENCOUNTER — Ambulatory Visit: Admit: 2018-03-03 | Discharge: 2018-04-01 | Payer: MEDICARE

## 2018-03-03 ENCOUNTER — Ambulatory Visit: Admit: 2018-03-03 | Discharge: 2018-03-09 | Payer: MEDICARE

## 2018-03-03 ENCOUNTER — Other Ambulatory Visit: Admit: 2018-03-03 | Discharge: 2018-04-01 | Payer: MEDICARE

## 2018-03-03 ENCOUNTER — Ambulatory Visit: Admit: 2018-03-03 | Discharge: 2018-04-01 | Payer: MEDICARE | Attending: Adult Health | Primary: Adult Health

## 2018-03-03 DIAGNOSIS — C50919 Malignant neoplasm of unspecified site of unspecified female breast: Principal | ICD-10-CM

## 2018-03-03 DIAGNOSIS — C50912 Malignant neoplasm of unspecified site of left female breast: Secondary | ICD-10-CM

## 2018-03-03 DIAGNOSIS — Z170 Estrogen receptor positive status [ER+]: Secondary | ICD-10-CM

## 2018-03-03 DIAGNOSIS — C50512 Malignant neoplasm of lower-outer quadrant of left female breast: Principal | ICD-10-CM

## 2018-04-01 ENCOUNTER — Other Ambulatory Visit: Admit: 2018-04-01 | Discharge: 2018-04-02 | Payer: MEDICARE

## 2018-04-01 ENCOUNTER — Institutional Professional Consult (permissible substitution): Admit: 2018-04-01 | Discharge: 2018-04-02 | Payer: MEDICARE

## 2018-04-01 DIAGNOSIS — C50919 Malignant neoplasm of unspecified site of unspecified female breast: Secondary | ICD-10-CM

## 2018-04-28 MED ORDER — CYCLOBENZAPRINE 5 MG TABLET
ORAL_TABLET | Freq: Three times a day (TID) | ORAL | 0 refills | 0 days | Status: CP | PRN
Start: 2018-04-28 — End: 2018-06-29

## 2018-05-26 ENCOUNTER — Ambulatory Visit: Admit: 2018-05-26 | Discharge: 2018-05-31 | Payer: MEDICARE

## 2018-05-26 ENCOUNTER — Other Ambulatory Visit: Admit: 2018-05-26 | Discharge: 2018-06-01 | Payer: MEDICARE

## 2018-05-26 ENCOUNTER — Ambulatory Visit: Admit: 2018-05-26 | Discharge: 2018-06-01 | Payer: MEDICARE

## 2018-05-26 ENCOUNTER — Ambulatory Visit
Admit: 2018-05-26 | Discharge: 2018-06-01 | Payer: MEDICARE | Attending: Hematology & Oncology | Primary: Hematology & Oncology

## 2018-05-26 DIAGNOSIS — C50512 Malignant neoplasm of lower-outer quadrant of left female breast: Secondary | ICD-10-CM

## 2018-05-26 DIAGNOSIS — C50919 Malignant neoplasm of unspecified site of unspecified female breast: Principal | ICD-10-CM

## 2018-05-26 DIAGNOSIS — C50912 Malignant neoplasm of unspecified site of left female breast: Secondary | ICD-10-CM

## 2018-05-26 DIAGNOSIS — Z170 Estrogen receptor positive status [ER+]: Secondary | ICD-10-CM

## 2018-06-18 DIAGNOSIS — Z170 Estrogen receptor positive status [ER+]: Principal | ICD-10-CM

## 2018-06-18 DIAGNOSIS — C50811 Malignant neoplasm of overlapping sites of right female breast: Principal | ICD-10-CM

## 2018-06-18 DIAGNOSIS — C50511 Malignant neoplasm of lower-outer quadrant of right female breast: Principal | ICD-10-CM

## 2018-06-18 DIAGNOSIS — C50919 Malignant neoplasm of unspecified site of unspecified female breast: Principal | ICD-10-CM

## 2018-06-29 MED ORDER — LETROZOLE 2.5 MG TABLET
ORAL_TABLET | Freq: Every day | ORAL | 3 refills | 0 days | Status: CP
Start: 2018-06-29 — End: 2018-09-30

## 2018-06-29 MED ORDER — CYCLOBENZAPRINE 5 MG TABLET
ORAL_TABLET | Freq: Three times a day (TID) | ORAL | 0 refills | 0.00000 days | Status: CP | PRN
Start: 2018-06-29 — End: 2018-08-27

## 2018-08-04 ENCOUNTER — Institutional Professional Consult (permissible substitution)
Admit: 2018-08-04 | Discharge: 2018-08-05 | Payer: MEDICARE | Attending: Hematology & Oncology | Primary: Hematology & Oncology

## 2018-08-04 DIAGNOSIS — Z170 Estrogen receptor positive status [ER+]: Secondary | ICD-10-CM

## 2018-08-04 DIAGNOSIS — C50812 Malignant neoplasm of overlapping sites of left female breast: Secondary | ICD-10-CM

## 2018-08-04 DIAGNOSIS — C50912 Malignant neoplasm of unspecified site of left female breast: Secondary | ICD-10-CM

## 2018-08-04 DIAGNOSIS — C50919 Malignant neoplasm of unspecified site of unspecified female breast: Principal | ICD-10-CM

## 2018-08-27 MED ORDER — CYCLOBENZAPRINE 5 MG TABLET
ORAL_TABLET | Freq: Three times a day (TID) | ORAL | 0 refills | 0 days | Status: CP | PRN
Start: 2018-08-27 — End: 2018-10-29

## 2018-09-27 ENCOUNTER — Ambulatory Visit: Admit: 2018-09-27 | Discharge: 2018-10-26 | Payer: MEDICARE

## 2018-09-27 ENCOUNTER — Other Ambulatory Visit: Admit: 2018-09-27 | Discharge: 2018-10-05 | Payer: MEDICARE

## 2018-09-27 ENCOUNTER — Ambulatory Visit: Admit: 2018-09-27 | Discharge: 2018-10-05 | Payer: MEDICARE

## 2018-09-27 DIAGNOSIS — C50812 Malignant neoplasm of overlapping sites of left female breast: Principal | ICD-10-CM

## 2018-09-27 DIAGNOSIS — C50919 Malignant neoplasm of unspecified site of unspecified female breast: Principal | ICD-10-CM

## 2018-09-27 DIAGNOSIS — Z170 Estrogen receptor positive status [ER+]: Secondary | ICD-10-CM

## 2018-09-27 DIAGNOSIS — C50811 Malignant neoplasm of overlapping sites of right female breast: Secondary | ICD-10-CM

## 2018-09-27 DIAGNOSIS — C50912 Malignant neoplasm of unspecified site of left female breast: Secondary | ICD-10-CM

## 2018-09-29 ENCOUNTER — Ambulatory Visit
Admit: 2018-09-29 | Discharge: 2018-09-30 | Payer: MEDICARE | Attending: Hematology & Oncology | Primary: Hematology & Oncology

## 2018-09-29 ENCOUNTER — Ambulatory Visit: Admit: 2018-09-29 | Discharge: 2018-09-30 | Payer: MEDICARE

## 2018-09-29 DIAGNOSIS — C50919 Malignant neoplasm of unspecified site of unspecified female breast: Principal | ICD-10-CM

## 2018-09-30 MED ORDER — LOPERAMIDE 2 MG TABLET
ORAL_TABLET | 5 refills | 0 days | Status: CP
Start: 2018-09-30 — End: ?

## 2018-09-30 MED ORDER — ABEMACICLIB 150 MG TABLET
ORAL_TABLET | Freq: Two times a day (BID) | ORAL | 5 refills | 0 days | Status: CP
Start: 2018-09-30 — End: 2018-10-05

## 2018-09-30 MED ORDER — ONDANSETRON HCL 8 MG TABLET
ORAL_TABLET | Freq: Three times a day (TID) | ORAL | 2 refills | 0.00000 days | Status: CP | PRN
Start: 2018-09-30 — End: ?

## 2018-09-30 NOTE — Unmapped (Signed)
Pharmacist Oral Chemotherapy Education    Medication: Abemaciclib + Fulvestrant    Medication Education     Susan Robbins is a 68 y.o. female with metastatic breast cancer who I am counseling today on abemaciclib and fulvestrant.    Abemaciclib (Verzenio)  Oral chemotherapy regimen: Abemaciclib 150 mg PO twice daily  Specialty Pharmacy: Patient prefers to use [facility] specialty pharmacy  Tentative Start Date: Pending    Side effects discussed included but were not limited to: diarrhea, myelosuppression (particularly neutropenia) nausea/vomiting, fatigue, headache, hair thinning, pulmonary toxicity and hepatotoxicity . Proper administration of the medication with or without food, twice daily continuous dosing schedule, oral chemotherapy handling precautions, and potential drug interactions were explained. I discussed the use of Imodium to treat diarrhea.  I also reviewed that labs will be monitored q2weeks for the first two cycles.    Fulvestrant (Faslodex)  Fulvestrant is an IM injection given on days 1, 15, 28 and then monthly.  Side effects reviewed included but were not limited to: injection site pain, nausea, diarrhea, increased liver enzymes, back or bone pain, headache, and fatigue.    Patient verbalized understanding of the above information as well as how to contact the Team with any questions/concerns.    Drug interactions: None identified    Handouts provided:  Patient handouts for abemaciclib and fulvestrant from [facility]    Patient verbalized understanding of the above information as well as how to contact the Team with any questions/concerns.    Approximate time spent on the phone with patient: 50 minutes     Susan Robbins PharmD, BCOP, CPP  Hematology/Oncology Pharmacist  P: [phone]

## 2018-09-30 NOTE — Unmapped (Signed)
Spoke with patient about appts/scheduled per flowsheet

## 2018-09-30 NOTE — Unmapped (Signed)
Per test claim for VERZENIO at the [facility], patient needs Medication Assistance Program for Prior Authorization.

## 2018-10-05 MED ORDER — ABEMACICLIB 150 MG TABLET
ORAL_TABLET | Freq: Two times a day (BID) | ORAL | 5 refills | 0.00000 days | Status: CP
Start: 2018-10-05 — End: ?

## 2018-10-05 NOTE — Unmapped (Signed)
Abemaciclib prescription sent to [facility] specialty pharmacy per patient preference.

## 2018-10-13 ENCOUNTER — Institutional Professional Consult (permissible substitution): Admit: 2018-10-13 | Discharge: 2018-10-14 | Payer: MEDICARE

## 2018-10-13 DIAGNOSIS — C50919 Malignant neoplasm of unspecified site of unspecified female breast: Principal | ICD-10-CM

## 2018-10-27 ENCOUNTER — Institutional Professional Consult (permissible substitution): Admit: 2018-10-27 | Discharge: 2018-10-27 | Payer: MEDICARE | Attending: Pharmacist | Primary: Pharmacist

## 2018-10-27 ENCOUNTER — Other Ambulatory Visit: Admit: 2018-10-27 | Discharge: 2018-10-27 | Payer: MEDICARE

## 2018-10-27 ENCOUNTER — Institutional Professional Consult (permissible substitution): Admit: 2018-10-27 | Discharge: 2018-10-27 | Payer: MEDICARE

## 2018-10-27 DIAGNOSIS — C50919 Malignant neoplasm of unspecified site of unspecified female breast: Principal | ICD-10-CM

## 2018-10-27 LAB — CBC W/ AUTO DIFF
BASOPHILS ABSOLUTE COUNT: 0.1 10*9/L (ref 0.0–0.1)
BASOPHILS RELATIVE PERCENT: 1.6 %
EOSINOPHILS ABSOLUTE COUNT: 0.1 10*9/L (ref 0.0–0.4)
EOSINOPHILS RELATIVE PERCENT: 3.2 %
HEMOGLOBIN: 13.3 g/dL (ref 12.0–16.0)
LARGE UNSTAINED CELLS: 3 % (ref 0–4)
LYMPHOCYTES ABSOLUTE COUNT: 1.1 10*9/L — ABNORMAL LOW (ref 1.5–5.0)
LYMPHOCYTES RELATIVE PERCENT: 27.2 %
MEAN CORPUSCULAR HEMOGLOBIN: 30.4 pg (ref 26.0–34.0)
MEAN CORPUSCULAR VOLUME: 89.6 fL (ref 80.0–100.0)
MEAN PLATELET VOLUME: 6.9 fL — ABNORMAL LOW (ref 7.0–10.0)
MONOCYTES ABSOLUTE COUNT: 0.1 10*9/L — ABNORMAL LOW (ref 0.2–0.8)
MONOCYTES RELATIVE PERCENT: 3.5 %
NEUTROPHILS ABSOLUTE COUNT: 2.4 10*9/L (ref 2.0–7.5)
NEUTROPHILS RELATIVE PERCENT: 61.3 %
PLATELET COUNT: 221 10*9/L (ref 150–440)
RED CELL DISTRIBUTION WIDTH: 14.9 % (ref 12.0–15.0)
WBC ADJUSTED: 3.9 10*9/L — ABNORMAL LOW (ref 4.5–11.0)

## 2018-10-27 LAB — COMPREHENSIVE METABOLIC PANEL
ALBUMIN: 4 g/dL (ref 3.5–5.0)
ALKALINE PHOSPHATASE: 41 U/L (ref 38–126)
ALT (SGPT): 15 U/L (ref <35)
ANION GAP: 6 mmol/L — ABNORMAL LOW (ref 7–15)
AST (SGOT): 25 U/L (ref 14–38)
BILIRUBIN TOTAL: 0.5 mg/dL (ref 0.0–1.2)
BLOOD UREA NITROGEN: 13 mg/dL (ref 7–21)
CALCIUM: 8.8 mg/dL (ref 8.5–10.2)
CHLORIDE: 100 mmol/L (ref 98–107)
CO2: 27 mmol/L (ref 22.0–30.0)
CREATININE: 1.05 mg/dL — ABNORMAL HIGH (ref 0.60–1.00)
EGFR CKD-EPI AA FEMALE: 62 mL/min/{1.73_m2} (ref >=60)
EGFR CKD-EPI NON-AA FEMALE: 54 mL/min/{1.73_m2} — ABNORMAL LOW (ref >=60)
POTASSIUM: 4.4 mmol/L (ref 3.5–5.0)
PROTEIN TOTAL: 7.4 g/dL (ref 6.5–8.3)
SODIUM: 133 mmol/L — ABNORMAL LOW (ref 135–145)

## 2018-10-27 LAB — CHLORIDE: Chloride:SCnc:Pt:Ser/Plas:Qn:: 100

## 2018-10-27 LAB — LYMPHOCYTES ABSOLUTE COUNT: Lab: 1.1 — ABNORMAL LOW

## 2018-10-27 NOTE — Unmapped (Signed)
Labs collected via #[id number] butterfly.  To next appt.

## 2018-10-27 NOTE — Unmapped (Signed)
Pharmacist Phone Visit Follow-Up    Cancer Team  Medical Oncology: Dr. 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  She is having abemaciclib-induced diarrhea, cramping, and mild nausea.  She is using Imodium appropriately to tolerate the diarrhea.  Labs reviewed.  WBC decreased and SCr increased minimally due to abemaciclib.  LFTs WNL.  No dose adjustments are necessary at this time..    Plan  1. Continue abemaciclib 150 mg BID with fulvestrant and denosumab  2. Continue Imodium for diarrhea.  Add simethicone for cramping.  3. Increase fluids including electrolyte drinks such as Gatorade     F/U  11/10/18 - Labs, fulvestrant, and appointment with Corrine Gaynell Face  ________________________________________________________________________     This is a Phone Visit, being conducted by telephone in lieu of an in-person visit during the COVID-19 pandemic.   Patient location during call: [facility]  Participants in the call, in addition to patient and provider: Husband  Reason for Visit: Oral chemotherapy management     Interval History   Susan Robbins is a 68 y.o. 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  I am calling her for a 2 week follow-up.  I spoke to her and her husband, [name].  She has been having multiple episodes of diarrhea, with some days worse then others, since starting abemaciclib.  She has about 2 stools per day on average, using 1-2 tablets of Imodium per day.  She also has cramping which can be mild/moderate but has had several episodes of bothersome cramping lasting for hours.  She has adjusted her diet to a low fiber diet to help with the diarrhea.  She seems to be tolerating the diarrhea and willing to learn how to deal with this adverse effect of abemaciclib.  Overall she feels good and continues to walk 3-4 miles per day.    Oral chemotherapy regimen: Abemaciclib 150 mg PO twice daily with fulvestrant  Specialty Pharmacy:  [facility] specialty pharmacy  Start Date: 10/13/18    Adverse effects  1. Diarrhea - Has 2 loose stools, on average, per day using 1-2 tablets of Imodium daily.  Appropriately has adjusted to a low fiber diet.  I encouraged her to continue to use Imodium and the low fiber diet to reduce diarrhea. Her sodium was slightly low today and I recommended that she increase fluids and incorporate electrolyte drinks.  2. Cramping - has mild/moderate cramping with a few episodes of bothersome cramping that lasted several hours.  I recommended that she try simethicone.  3. Nausea w/o emesis - Has mild nausea that can last about an hour after she takes abemaciclib.  She has ondansetron but does not want to use it.  I recommended trying ginger including ginger chews or tea.    Labs  1. CBC/diff - adequate to continue abemaciclib  2. SCr - slightly increased.  This is a common adverse effect of abemaciclib.  No modifications recommended.  Will monitor.  3. LFTs - WNL    Adherence - No missed doses.  She uses the alarm on her iphone to remember to take her medication    Drug interactions - No new  medications for assessment.    Patient verbalized understanding of the above information.     Approximate time spent on phone with patient: 35 minutes    There is no height or weight on file to calculate BSA.   There were no vitals taken for this visit.   Wt Readings from Last 3 Encounters:   09/29/18 59 kg (130 lb 1.6 oz)   05/26/18 59.6 kg (131 lb 4.8 oz)   03/03/18 59.7 kg (131 lb 11.2 oz)        Oncology History Overview Note   Seen at [facility]     Metastatic breast cancer (CMS-HCC)   06/1989 -  Other    Diagnosed with right breast DCIS     12/1997 Initial Diagnosis    Left BRCA-associated breast cancer - Stg II, 4 positive nodes, ER+. S/p bilateral mastectomy w/ recon     01/1998 - 05/1998 Chemotherapy    AC x 6 cycles and Taxol x 3 (stopped early d/t neuropathy). Dr. Cyndie Chime.     07/1998 - 08/1998 Radiation    Adjuvant radiation therapy      09/1998 Endocrine/Hormone Therapy    Started on Tamoxifen, completed 5 years 09/2003. Unable to tolerate AI      Other    BRCA testing - BRCA 2 mutation. Had hysterectomy and bilateral salpingo-oophorectomy in May 2017. Undergone EUS and seen by GI     11/2016 -  Other    See  in [facility] for assessment of ? GI bleed prompting imaging - found to not be blood (beets) but imaging found evidence disease recurrence     11/30/2016 Interval Scan(s)    CT c/a/p -c/w peritoneal carcinomatosis with peritoneal thickening and enhancement as well as mild ascites. Vague suspicious liver hypodensities. PET 9/11:  Several small hypermetabolic osseous metastases scattered in the thoracolumbar spine.  2. Widespread hypermetabolic peritoneal carcinomatosis throughout the peritoneal cavity as detailed. Small volume pelvic ascites.  3. Hypermetabolic left adrenal metastasis.  4. Hypermetabolic mediastinal nodal metastases.  5. Hypermetabolic inferior left axillary soft tissue mass adjacent  to surgical clips, compatible with soft tissue metastasis.  6. Small dependent right pleural effusion.     12/02/2016 Biopsy    Peritoneal soft tissue biopsy - metastatic lobular carcinoma. ER 100%, PR 10%, and HER2 negative (1+)     12/10/2016 Endocrine/Hormone Therapy    Letrozole / palbociclib initiated (palbo started 01/2017). Dose reduced palbo to 100mg  for persistent grade 3 neutropenia 05/2017     12/30/2016 Genetics      Results revealed patient has the following mutation(s): Somatic tumor testing by FN[facility]:  BRCA2 positive   PIK3CA [id number]  CCND1 amplification  FGFR1 amplification  CDH1 V150fs'36  FGF19 amplification FGF3 amplification  FGF4 amplification  MGL1 amplificiation-equivocal   NSD3 (WHSC169)amplification  NTRK1 amplificiation- equivocal  TP53 V157G-subclonal  [id number] amplification      03/2017 -  Research Study Participant    HARMONY - original 1999 tumor destroyed. Met inadequate / used up       10/13/2018 -  Chemotherapy    OP BREAST FULVESTRANT / abemaciclib  Fulvestrant 500 mg IM Day 1 and 15 of Cycle 1, then Day 1 of each cycle thereafter  Palbociclib 125 mg PO daily for 3 weeks on, followed by 1 week off  28-day cycle          Pertinent Labs:   Lab on 10/27/2018   Component Date Value Ref Range Status   ??? Sodium 10/27/2018  133* 135 - 145 mmol/L Final   ??? Potassium 10/27/2018 4.4  3.5 - 5.0 mmol/L Final   ??? Chloride 10/27/2018 100  98 - 107 mmol/L Final   ??? Anion Gap 10/27/2018 6* 7 - 15 mmol/L Final   ??? CO2 10/27/2018 27.0  22.0 - 30.0 mmol/L Final   ??? BUN 10/27/2018 13  7 - 21 mg/dL Final   ??? Creatinine 10/27/2018 1.05* 0.60 - 1.00 mg/dL Final   ??? BUN/Creatinine Ratio 10/27/2018 12   Final   ??? EGFR CKD-EPI Non-African American,* 10/27/2018 54* >=60 mL/min/1.43m2 Final   ??? EGFR CKD-EPI African American, Fem* 10/27/2018 62  >=60 mL/min/1.9m2 Final   ??? Glucose 10/27/2018 81  70 - 179 mg/dL Final   ??? Calcium 32/44/0102 8.8  8.5 - 10.2 mg/dL Final   ??? Albumin 72/53/6644 4.0  3.5 - 5.0 g/dL Final   ??? Total Protein 10/27/2018 7.4  6.5 - 8.3 g/dL Final   ??? Total Bilirubin 10/27/2018 0.5  0.0 - 1.2 mg/dL Final   ??? AST 03/47/4259 25  14 - 38 U/L Final   ??? ALT 10/27/2018 15  <35 U/L Final   ??? Alkaline Phosphatase 10/27/2018 41  38 - 126 U/L Final   ??? WBC 10/27/2018 3.9* 4.5 - 11.0 10*9/L Final   ??? RBC 10/27/2018 4.39  4.00 - 5.20 10*12/L Final   ??? HGB 10/27/2018 13.3  12.0 - 16.0 g/dL Final   ??? HCT 56/38/7564 39.3  36.0 - 46.0 % Final   ??? MCV 10/27/2018 89.6  80.0 - 100.0 fL Final   ??? MCH 10/27/2018 30.4  26.0 - 34.0 pg Final   ??? MCHC 10/27/2018 33.9  31.0 - 37.0 g/dL Final   ??? RDW 33/29/5188 14.9  12.0 - 15.0 % Final   ??? MPV 10/27/2018 6.9* 7.0 - 10.0 fL Final   ??? Platelet 10/27/2018 221  150 - 440 10*9/L Final   ??? Neutrophils % 10/27/2018 61.3  % Final   ??? Lymphocytes % 10/27/2018 27.2  % Final   ??? Monocytes % 10/27/2018 3.5  % Final   ??? Eosinophils % 10/27/2018 3.2  % Final   ??? Basophils % 10/27/2018 1.6  % Final   ??? Absolute Neutrophils 10/27/2018 2.4  2.0 - 7.5 10*9/L Final   ??? Absolute Lymphocytes 10/27/2018 1.1* 1.5 - 5.0 10*9/L Final   ??? Absolute Monocytes 10/27/2018 0.1* 0.2 - 0.8 10*9/L Final   ??? Absolute Eosinophils 10/27/2018 0.1  0.0 - 0.4 10*9/L Final   ??? Absolute Basophils 10/27/2018 0.1  0.0 - 0.1 10*9/L Final   ??? Large Unstained Cells 10/27/2018 3  0 - 4 % Final       Current medications:   Current Outpatient Medications   Medication Sig Dispense Refill   ??? abemaciclib (VERZENIO) 150 mg Tab tablet Take 1 tablet (150 mg total) by mouth two (2) times a day with or without food. 56 tablet 5   ??? azithromycin (AZASITE) 1 % ophthalmic solution Azasite 1 % eye drops   PALCE 1 DROP IN BOTH EYES AS DIRECTED     ??? calcium carbonate (TUMS) 200 mg calcium (500 mg) chewable tablet Tums     ??? cholecalciferol, vitamin D3, 2,000 unit cap Vitamin D3 2,000 unit capsule   Take by oral route.     ??? clobetasol (TEMOVATE) 0.05 % external solution clobetasol 0.05 % scalp solution   1 APPLICATION APPLY ON THE SKIN AT BEDTIME APPLY AT BEDTIME TO SPOT ON SCALP FOR 2  WEEKS     ??? cyanocobalamin, vitamin B-12, (VITAMIN B-12 INJ) Inject as directed every thirty (30) days.     ??? cyclobenzaprine (FLEXERIL) 5 MG tablet Take 1 tablet (5 mg total) by mouth Three (3) times a day as needed for muscle spasms. 60 tablet 0   ??? docusate sodium (COLACE) 100 MG capsule Take 100 mg by mouth two (2) times a day as needed.     ??? ergocalciferol, vitamin D2, (VITAMIN D2 ORAL) Take 50,000 Units by mouth every seven (7) days.     ??? hyoscyamine sulfate (LEVSIN) 0.125 mg tablet Place 0.125 mg under the tongue daily as needed.     ??? ibuprofen (ADVIL,MOTRIN) 200 MG tablet Take 200 mg by mouth every six (6) hours as needed.     ??? Lactobacillus acidophilus (PROBIOTIC ORAL) Take by mouth.     ??? levothyroxine (SYNTHROID, LEVOTHROID) 75 MCG tablet Take 75 mcg by mouth daily.     ??? loperamide (IMODIUM A-D) 2 mg tablet Takes 2 tabs with first loose stool and then 1 tablet with each subsequent loose stool 30 tablet 5   ??? ondansetron (ZOFRAN) 8 MG tablet Take 1 tablet (8 mg total) by mouth every eight (8) hours as needed for nausea. 30 tablet 2   ??? pantoprazole (PROTONIX) 40 MG tablet Take 40 mg by mouth daily as needed.     ??? senna (SENOKOT) 8.6 mg tablet Take 1 tablet by mouth daily.     ??? triamcinolone (KENALOG) 0.1 % lotion Apply topically Three (3) times a day.     ??? valACYclovir (VALTREX) 500 MG tablet two (2) times a day as needed.       Current Facility-Administered Medications   Medication Dose Route Frequency Provider Last Rate Last Dose   ??? denosumab (XGEVA) 120 mg/1.7 mL (70 mg/mL) injection              Facility-Administered Medications Ordered in Other Visits   Medication Dose Route Frequency Provider Last Rate Last Dose   ??? denosumab (XGEVA) 120 mg/1.7 mL (70 mg/mL) injection            ??? denosumab (XGEVA) 120 mg/1.7 mL (70 mg/mL) injection            ??? denosumab (XGEVA) 120 mg/1.7 mL (70 mg/mL) injection            ??? denosumab (XGEVA) injection 120 mg  120 mg Subcutaneous Once Corrine Tasia Catchings, AGNP

## 2018-10-28 LAB — CA 27-29: Lab: 350.7 — ABNORMAL HIGH

## 2018-10-28 NOTE — Unmapped (Signed)
Spoke to patient about lab add on for 7.29/pt confirmed

## 2018-10-29 MED ORDER — CYCLOBENZAPRINE 5 MG TABLET
ORAL_TABLET | Freq: Three times a day (TID) | ORAL | 0 refills | 20 days | Status: CP | PRN
Start: 2018-10-29 — End: ?

## 2018-11-10 ENCOUNTER — Institutional Professional Consult (permissible substitution): Admit: 2018-11-10 | Discharge: 2018-11-11 | Payer: MEDICARE

## 2018-11-10 ENCOUNTER — Other Ambulatory Visit: Admit: 2018-11-10 | Discharge: 2018-11-11 | Payer: MEDICARE

## 2018-11-10 ENCOUNTER — Telehealth: Admit: 2018-11-10 | Discharge: 2018-11-11 | Payer: MEDICARE | Attending: Adult Health | Primary: Adult Health

## 2018-11-10 DIAGNOSIS — C50919 Malignant neoplasm of unspecified site of unspecified female breast: Principal | ICD-10-CM

## 2018-11-10 DIAGNOSIS — C50112 Malignant neoplasm of central portion of left female breast: Secondary | ICD-10-CM

## 2018-11-10 DIAGNOSIS — Z170 Estrogen receptor positive status [ER+]: Secondary | ICD-10-CM

## 2018-11-10 LAB — COMPREHENSIVE METABOLIC PANEL
ALBUMIN: 3.8 g/dL (ref 3.5–5.0)
ALKALINE PHOSPHATASE: 40 U/L (ref 38–126)
ALT (SGPT): 12 U/L (ref <35)
ANION GAP: 7 mmol/L (ref 7–15)
AST (SGOT): 22 U/L (ref 14–38)
BILIRUBIN TOTAL: 0.5 mg/dL (ref 0.0–1.2)
BLOOD UREA NITROGEN: 15 mg/dL (ref 7–21)
BUN / CREAT RATIO: 17
CALCIUM: 8.7 mg/dL (ref 8.5–10.2)
CHLORIDE: 99 mmol/L (ref 98–107)
CO2: 28 mmol/L (ref 22.0–30.0)
CREATININE: 0.89 mg/dL (ref 0.60–1.00)
GLUCOSE RANDOM: 81 mg/dL (ref 70–179)
POTASSIUM: 4.6 mmol/L (ref 3.5–5.0)
PROTEIN TOTAL: 7 g/dL (ref 6.5–8.3)
SODIUM: 134 mmol/L — ABNORMAL LOW (ref 135–145)

## 2018-11-10 LAB — CBC W/ AUTO DIFF
BASOPHILS ABSOLUTE COUNT: 0.1 10*9/L (ref 0.0–0.1)
BASOPHILS RELATIVE PERCENT: 1.5 %
EOSINOPHILS ABSOLUTE COUNT: 0.2 10*9/L (ref 0.0–0.4)
EOSINOPHILS RELATIVE PERCENT: 5.5 %
HEMATOCRIT: 37.1 % (ref 36.0–46.0)
HEMOGLOBIN: 12.6 g/dL (ref 12.0–16.0)
LARGE UNSTAINED CELLS: 4 % (ref 0–4)
LYMPHOCYTES ABSOLUTE COUNT: 1 10*9/L — ABNORMAL LOW (ref 1.5–5.0)
LYMPHOCYTES RELATIVE PERCENT: 29.3 %
MEAN CORPUSCULAR HEMOGLOBIN CONC: 33.9 g/dL (ref 31.0–37.0)
MEAN CORPUSCULAR HEMOGLOBIN: 30.6 pg (ref 26.0–34.0)
MEAN CORPUSCULAR VOLUME: 90.3 fL (ref 80.0–100.0)
MEAN PLATELET VOLUME: 6.9 fL — ABNORMAL LOW (ref 7.0–10.0)
MONOCYTES ABSOLUTE COUNT: 0.2 10*9/L (ref 0.2–0.8)
MONOCYTES RELATIVE PERCENT: 4.6 %
PLATELET COUNT: 214 10*9/L (ref 150–440)
RED BLOOD CELL COUNT: 4.1 10*12/L (ref 4.00–5.20)
RED CELL DISTRIBUTION WIDTH: 15.3 % — ABNORMAL HIGH (ref 12.0–15.0)
WBC ADJUSTED: 3.3 10*9/L — ABNORMAL LOW (ref 4.5–11.0)

## 2018-11-10 LAB — PHOSPHORUS: Phosphate:MCnc:Pt:Ser/Plas:Qn:: 3.3

## 2018-11-10 LAB — BUN / CREAT RATIO: Urea nitrogen/Creatinine:MRto:Pt:Ser/Plas:Qn:: 17

## 2018-11-10 LAB — MEAN CORPUSCULAR VOLUME: Lab: 90.3

## 2018-11-10 NOTE — Unmapped (Signed)
BREAST MEDICAL ONCOLOGY - Arts administrator (in lieu of in-person visit during COVID-19 pandemic).     Visit Conducted via Video   Person Contacted: Susan Robbins   Contact Phone [phone]   Phone Outcome: Contacted Susan Robbins/Caregiver  Is there someone else in the room? YES (her sister)    I spent 25 minutes on the real-time audio and video with the Susan Robbins. I spent an additional 5 minutes on pre- and post-visit activities.     The Susan Robbins was physically located in [place] or a state in which I am permitted to provide care. The Susan Robbins and/or parent/guardian understood that s/he may incur co-pays and cost sharing, and agreed to the telemedicine visit. The visit was reasonable and appropriate under the circumstances given the Susan Robbins's presentation at the time.    The Susan Robbins and/or parent/guardian has been advised of the potential risks and limitations of this mode of treatment (including, but not limited to, the absence of in-person examination) and has agreed to be treated using telemedicine. The Susan Robbins's/Susan Robbins's family's questions regarding telemedicine have been answered.     If the visit was completed in an ambulatory setting, the Susan Robbins and/or parent/guardian has also been advised to contact their provider???s office for worsening conditions, and seek emergency medical treatment and/or call 911 if the Susan Robbins deems either necessary.       Susan Robbins location during call:  [facility]    Reason for Visit: Management of breast cancer.     ---------------------------------------------------------------------------------------------------------------------------------------------------------    Breast Cancer Return Susan Robbins Evaluation    PCP: Donivan Scull, MD   PCP [facility]): Dr. [name] [phone] (fax # [phone])  [facility]: Chalmers Cater, MD ([facility], [place]) - send notes     Reason for Visit: 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  See OncHistory for details. Letrozole initiated 12/2016, Ibrance initiated 01/29/17. DR Ilda Foil to 100mg  05/2017 secondary to neutropenia. Continued on letrozole only d/t concern for MSK side effects from [place].     Susan Robbins was living in [facility] given COVID-19 pandemic but has recently moved back to [facility] for the summer and will receive care here. She stopped the letrozole on 5/22 due to rising Ca27.29 levels in [place] however had not yet started another regimen. Restaging CT c/a/p from 09/27/18 which now shows overt progression in the liver and peritoneum and most recent Ca27-29 however has now more than doubled to 313.1. Started on fulvestrant + abemaciclib.     -- here today for cycle 2 fulvestrant + abemaciclib  -- continue Xgeva monthly, given today.  -- CA 27.29: on 7/15 was 350.7, todays level still pending  -- plan to repeat scans end of September    Future Options:   1. Olaparib or talazoparib (gBRCA2 mutation)   2. Alpelisib + ET (PIK3CA mutation)   3. Clinical trial: olaparib + immunotherapy. She has a HR+ cancer which tends not to be immune activated; the trial builds upon data that PARPi can induce immune activation / infiltration that might synergize with ICI. This would require biopsies of the liver lesions as well as more visits since she will be returning per protocol.    Genomics  - consented for HARMONY trial - however original tumor destroyed, insufficient 2018 sample - may consider biopsy at next evidence PD.   - FM1 testing (liquid and bx test from 2018) showed BRCA2  mutation + PIK3CA    Dermatology. Skin damage with a plan for topical fluorouracil. Within reason, should not interfere with her breast cancer therapy.     Neuro.  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 Monitor.     Dispo.   --Cycle 2 Faslodex + abemaciclib today, will also get Xgeva  --RTC in 2 weeks for labs + f/u with Aimee [place]  --RTC in 4 weeks for C3 Faslodex + virtual visit   --Will schedule scans for end of September + f/u with Dr. Iona Hansen on 9/23  ------------------------------------     Interval Medical / Social History:  She is here accompanied by her sister for follow-up prior to C2 of fulvestrant + abemaciclib  Today's visit was done via video, Susan Robbins was in the hem onc clinic for the visit and was comfortable with doing the visit virtually.    -- started fulvestrant + abemaciclib on 7/1, overall tolerating ok.   -- she did notice some redness and itching around the injection site of her D15 dose, she took an antihistamine and used benadryl cream with good relief. Will monitor injection site after today's injection. She has continued on Claritin to see if that helps to mitigate a rash/reaction today.  -- having diarrhea 1-2 times a day, but some days does have a normal BM which is associated with abdominal cramping. Does not have abdominal cramping with the diarrhea. Is using imodium for the diarrhea. Has hemorrhoids but no recent issues with them.  Discussed the abdominal cramping with Aimee who recommended gas-x, she did try this and it seemed to help some. She has also been adjusting her diet some.  -- no issues with appetite, has had some nausea but its minimal  -- no fatigue   --12-system ROS otherwise mild/none    Labs: CMP ok. CBC ok.   Tumor markers: Ca27.29 has doubled from 170 (08/26/18)--> 313.1 (09/27/18) --> today's pending.    Staging Scans:  - 09/27/18 NM/BS with new, indeterminate focus tracer uptake along the medial L clavicle, otherwise stable osseous disease. CT A/P reveals increased in size and number of hepatic metastases, increased ascites, and increased peritoneal carcinomatosis. CT Chest shows new small R pleural effusion but otherwise stable disease.      Social:  - Has seen Dr. Chalmers Cater in [place] in past  - lives mostly in [place] - sees PCP Dr. [name] - [phone]   -Spent their 45th Anniversary in [place], but since have limited travel. Her sister is here from [place] this weekend to help with cleaning out her house.    Oncology History Overview Note   Seen at [facility]     Metastatic breast cancer (CMS-HCC)   06/1989 -  Other    Diagnosed with right breast DCIS     12/1997 Initial Diagnosis    Left BRCA-associated breast cancer - Stg II, 4 positive nodes, ER+. S/p bilateral mastectomy w/ recon     01/1998 - 05/1998 Chemotherapy    AC x 6 cycles and Taxol x 3 (stopped early d/t neuropathy). Dr. Cyndie Chime.     07/1998 - 08/1998 Radiation    Adjuvant radiation therapy      09/1998 Endocrine/Hormone Therapy    Started on Tamoxifen, completed 5 years 09/2003. Unable to tolerate AI      Other    BRCA testing - BRCA 2 mutation. Had hysterectomy and bilateral salpingo-oophorectomy in May 2017. Undergone EUS and seen by GI     11/2016 -  Other    See  in [facility] for assessment of ? GI bleed prompting imaging - found to not be blood (beets) but imaging found evidence disease recurrence     11/30/2016 Interval Scan(s)    CT c/a/p -c/w peritoneal carcinomatosis with peritoneal thickening and enhancement as well as mild ascites. Vague suspicious liver hypodensities. PET 9/11:  Several small hypermetabolic osseous metastases scattered in the thoracolumbar spine.  2. Widespread hypermetabolic peritoneal carcinomatosis throughout the peritoneal cavity as detailed. Small volume pelvic ascites.  3. Hypermetabolic left adrenal metastasis.  4. Hypermetabolic mediastinal nodal metastases.  5. Hypermetabolic inferior left axillary soft tissue mass adjacent  to surgical clips, compatible with soft tissue metastasis.  6. Small dependent right pleural effusion.     12/02/2016 Biopsy    Peritoneal soft tissue biopsy - metastatic lobular carcinoma. ER 100%, PR 10%, and HER2 negative (1+)     12/10/2016 Endocrine/Hormone Therapy    Letrozole / palbociclib initiated (palbo started 01/2017). Dose reduced palbo to 100mg  for persistent grade 3 neutropenia 05/2017     12/30/2016 Genetics      Results revealed Susan Robbins has the following mutation(s): Somatic tumor testing by FN[facility]:  BRCA2 positive   PIK3CA [id number]  CCND1 amplification  FGFR1 amplification  CDH1 [id number]'36  FGF19 amplification  FGF3 amplification  FGF4 amplification  MGL1 amplificiation-equivocal   NSD3 (WHSC169)amplification  NTRK1 amplificiation- equivocal  TP53 V157G-subclonal  ZNF703 amplification      03/2017 -  Research Study Participant    HARMONY - original 1999 tumor destroyed. Met inadequate / used up       10/13/2018 -  Chemotherapy    OP BREAST FULVESTRANT / abemaciclib  Fulvestrant 500 mg IM Day 1 and 15 of Cycle 1, then Day 1 of each cycle thereafter  Palbociclib 125 mg PO daily for 3 weeks on, followed by 1 week off  28-day cycle       Past Medical History:   Diagnosis Date   ??? BRCA positive     germline BRCA2   ??? Breast cancer (CMS-HCC) 1999   ??? Disease of thyroid gland     hypothyroidism   ??? DVT (deep venous thrombosis) (CMS-HCC) 1973    thought provoked by accident.    ??? Hypertension    ??? Osteoporosis     on Prolia until metastatic breast cancer diagnosis     Gyn History: menopause age 25 with chemotherapy.     Past Surgical History:   Procedure Laterality Date   ??? HYSTERECTOMY     ??? MASTECTOMY Bilateral 1999     Current Outpatient Medications   Medication Sig Dispense Refill   ??? abemaciclib (VERZENIO) 150 mg Tab tablet Take 1 tablet (150 mg total) by mouth two (2) times a day with or without food. 56 tablet 5   ??? azithromycin (AZASITE) 1 % ophthalmic solution Azasite 1 % eye drops   PALCE 1 DROP IN BOTH EYES AS DIRECTED     ??? calcium carbonate (TUMS) 200 mg calcium (500 mg) chewable tablet Tums     ??? cholecalciferol, vitamin D3, 2,000 unit cap Vitamin D3 2,000 unit capsule   Take by oral route.     ??? clobetasol (TEMOVATE) 0.05 % external solution clobetasol 0.05 % scalp solution   1 APPLICATION APPLY ON THE SKIN AT BEDTIME APPLY AT BEDTIME TO SPOT ON SCALP FOR 2 WEEKS     ??? cyanocobalamin, vitamin B-12, (VITAMIN B-12 INJ) Inject as directed every thirty (30) days.     ???  cyclobenzaprine (FLEXERIL) 5 MG tablet Take 1 tablet (5 mg total) by mouth Three (3) times a day as needed for muscle spasms. 60 tablet 0   ??? docusate sodium (COLACE) 100 MG capsule Take 100 mg by mouth two (2) times a day as needed.     ??? ergocalciferol, vitamin D2, (VITAMIN D2 ORAL) Take 50,000 Units by mouth every seven (7) days.     ??? hyoscyamine sulfate (LEVSIN) 0.125 mg tablet Place 0.125 mg under the tongue daily as needed.     ??? ibuprofen (ADVIL,MOTRIN) 200 MG tablet Take 200 mg by mouth every six (6) hours as needed.     ??? Lactobacillus acidophilus (PROBIOTIC ORAL) Take by mouth.     ??? levothyroxine (SYNTHROID, LEVOTHROID) 75 MCG tablet Take 75 mcg by mouth daily.     ??? loperamide (IMODIUM A-D) 2 mg tablet Takes 2 tabs with first loose stool and then 1 tablet with each subsequent loose stool 30 tablet 5   ??? ondansetron (ZOFRAN) 8 MG tablet Take 1 tablet (8 mg total) by mouth every eight (8) hours as needed for nausea. 30 tablet 2   ??? pantoprazole (PROTONIX) 40 MG tablet Take 40 mg by mouth daily as needed.     ??? senna (SENOKOT) 8.6 mg tablet Take 1 tablet by mouth daily.     ??? triamcinolone (KENALOG) 0.1 % lotion Apply topically Three (3) times a day.     ??? valACYclovir (VALTREX) 500 MG tablet two (2) times a day as needed.       No current facility-administered medications for this visit.      Facility-Administered Medications Ordered in Other Visits   Medication Dose Route Frequency Provider Last Rate Last Dose   ??? denosumab (XGEVA) 120 mg/1.7 mL (70 mg/mL) injection            ??? denosumab (XGEVA) 120 mg/1.7 mL (70 mg/mL) injection            ??? denosumab (XGEVA) 120 mg/1.7 mL (70 mg/mL) injection              Allergies   Allergen Reactions   ??? Berry Flavor Nausea And Vomiting     All berries   ??? Heparin Analogues Hives     Hives in the past but has had heparin recently with  No problems   ??? Penicillins Other (See Comments)     Reaction unknown occurred while in college  Has Susan Robbins had a PCN reaction causing immediate rash, facial/tongue/throat swelling, SOB or lightheadedness with hypotension: reaction unknown  Has Susan Robbins had a PCN reaction causing severe rash involving mucus membranes or skin necrosis: reaction unknown  Has Susan Robbins had a PCN reaction that required hospitalization no  Has Susan Robbins had a PCN reaction occurring within the last 10 years: no  If all of the above answers are NO, then may proceed with Cep  Reaction unknown occurred while in college  Has Susan Robbins had a PCN reaction causing immediate rash, facial/tongue/throat swelling, SOB or lightheadedness with hypotension: reaction unknown  Has Susan Robbins had a PCN reaction causing severe rash involving mucus membranes or skin necrosis: reaction unknown  Has Susan Robbins had a PCN reaction that required hospitalization no  Has Susan Robbins had a PCN reaction occurring within the last 10 years: no  If all of the above answers are NO, then may proceed with Cep   ??? Oxycodone Nausea Only     Social History     Social History Narrative    Lives  in [place] several months per year with husband [name], however primary residence is [place] (45m / yr, has an excellent PCP who has offered to help manage care while down there) and they spend up to 2 months in a family home in [place], [place].      Family History   Problem Relation Age of Onset   ??? Pancreatic cancer Father 44        died in 4 months    ??? BRCA 1/2 Sister         BRCA 2+   ??? BRCA 1/2 Brother         BRCA 2+ (2 of 7 brothers are positive)    ??? BRCA 1/2 Brother         BRCA 2+     Review of Systems: A 12-system review of systems was obtained including: Constitutional, Eyes, ENT, Cardiovascular, Respiratory, GI, GU, Musculoskeletal, Skin, Neurological, Psychiatric, Endocrine, Heme/Lymphatic, and Allergic/Immunologic systems. It is negative or non-contributory to the Susan Robbins???s management except as noted in the history above. ROS performed over the phone.     Physical Examination: -- No physical exam performed as this was a telephone visit, below is the prior exam from 09/29/2018.  General:  Healthy-appearing female in no acute distress.  Cardiovasc:  No heaves, regular, no additional sounds. No lower extremity edema.    Respiratory:  Chest clear to percussion and auscultation, unlabored  Gastrointestinal:  Soft, nontender, no hepatomegaly. Normal bowel sounds. Nodule deep into the abdomen to the left and superior of umbilicus, feels like might be in the abdominal musculature wall, indistinct, 3 x 3.5 cm, non tender  Musculoskeletal:  No bony pain or tenderness.   Skin and Subcutaneous Tissues: patchy skin lesions scattered on her face, chronic.    Psychiatric: Mood is normal.  No other symptoms.   Neuro:  Alert and oriented.  Gait and coordination normal  Upper Extremity Lymphedema: None.   Breast/Chest: bilateral mastectomy without CW disease  Heme/Lymphatic/Immunologic: B/l axilla and supraclav ok. - unchanged exam today.     I have personally reviewed the following diagnostic studies:    Breast imaging and pathology reviewed. See Results for details

## 2018-11-10 NOTE — Unmapped (Signed)
It was a pleasure to see you today in the Medical Oncology Clinic.    Please call our nurse navigator, Modena Jansky, if you have any interval questions or concerns:      Modena Jansky, RN: phone: [phone]     Prescription refills: [phone]     FAX: [phone]   ----------------------------------------------------------------------------------------------------  For health related questions call:   Please call NURSE TRIAGE LINE at [phone]    For appointments & questions Monday through Friday 8 AM???5 PM     Please call [phone] or Toll free [phone]    On Lovenia Kim and Holidays  Call [phone] and ask for the oncologist on call    For emergencies, evenings or weekends, please call [phone] and ask for the oncology fellow on call.     Reasons to call emergency line may include:   Fever of 100.5 or greater   Nausea and/or vomiting not relieved with nausea medicine   Diarrhea or constipation not relieved with bowel regimen   Severe pain not relieved with usual pain regimen     --------------------------------------------------------------------------------------------------------  Given the rapidly spreading coronavirus (COVID-19), we recommended you avoid travel and crowds, and wash your hands frequently.     If you develop fever, cough, shortness of breath and/or have known exposure (close contact < 6 feet) with someone who has tested positive please call the [facility] Coronavirus Helpline at [phone].         Carlos American, DNP, NP-C  Breast Medical Oncology  [facility] Hematology/Oncology  [address]  [place], [place] [address]

## 2018-11-10 NOTE — Unmapped (Signed)
Venipuncture LAC, labs drawn and sent.  To next appt.

## 2018-11-11 LAB — CANCER ANTIGEN 27.29: CA 27-29: 419.4 U/mL — ABNORMAL HIGH (ref 0.0–38.6)

## 2018-11-11 LAB — CA 27-29: Lab: 419.4 — ABNORMAL HIGH

## 2018-11-18 NOTE — Unmapped (Signed)
Spoke to patient about upcoming appts/confirmed

## 2018-11-24 ENCOUNTER — Other Ambulatory Visit: Admit: 2018-11-24 | Discharge: 2018-11-24 | Payer: MEDICARE

## 2018-11-24 ENCOUNTER — Institutional Professional Consult (permissible substitution): Admit: 2018-11-24 | Discharge: 2018-11-24 | Payer: MEDICARE | Attending: Pharmacist | Primary: Pharmacist

## 2018-11-24 DIAGNOSIS — C50919 Malignant neoplasm of unspecified site of unspecified female breast: Principal | ICD-10-CM

## 2018-11-24 LAB — COMPREHENSIVE METABOLIC PANEL
ALKALINE PHOSPHATASE: 41 U/L (ref 38–126)
ALT (SGPT): 13 U/L (ref <35)
ANION GAP: 6 mmol/L — ABNORMAL LOW (ref 7–15)
AST (SGOT): 23 U/L (ref 14–38)
BILIRUBIN TOTAL: 0.5 mg/dL (ref 0.0–1.2)
BLOOD UREA NITROGEN: 15 mg/dL (ref 7–21)
BUN / CREAT RATIO: 14
CALCIUM: 9.4 mg/dL (ref 8.5–10.2)
CHLORIDE: 99 mmol/L (ref 98–107)
CO2: 30 mmol/L (ref 22.0–30.0)
CREATININE: 1.07 mg/dL — ABNORMAL HIGH (ref 0.60–1.00)
EGFR CKD-EPI AA FEMALE: 61 mL/min/{1.73_m2} (ref >=60)
EGFR CKD-EPI NON-AA FEMALE: 53 mL/min/{1.73_m2} — ABNORMAL LOW (ref >=60)
GLUCOSE RANDOM: 92 mg/dL (ref 70–179)
POTASSIUM: 4.6 mmol/L (ref 3.5–5.0)
PROTEIN TOTAL: 7.3 g/dL (ref 6.5–8.3)
SODIUM: 135 mmol/L (ref 135–145)

## 2018-11-24 LAB — CBC W/ AUTO DIFF
BASOPHILS RELATIVE PERCENT: 2.1 %
EOSINOPHILS ABSOLUTE COUNT: 0.3 10*9/L (ref 0.0–0.4)
EOSINOPHILS RELATIVE PERCENT: 7 %
HEMATOCRIT: 35.7 % — ABNORMAL LOW (ref 36.0–46.0)
HEMOGLOBIN: 12.3 g/dL (ref 12.0–16.0)
LYMPHOCYTES RELATIVE PERCENT: 27.8 %
MEAN CORPUSCULAR HEMOGLOBIN CONC: 34.4 g/dL (ref 31.0–37.0)
MEAN CORPUSCULAR HEMOGLOBIN: 31.3 pg (ref 26.0–34.0)
MEAN CORPUSCULAR VOLUME: 90.9 fL (ref 80.0–100.0)
MEAN PLATELET VOLUME: 6.7 fL — ABNORMAL LOW (ref 7.0–10.0)
MONOCYTES ABSOLUTE COUNT: 0.2 10*9/L (ref 0.2–0.8)
MONOCYTES RELATIVE PERCENT: 4.7 %
NEUTROPHILS ABSOLUTE COUNT: 2.1 10*9/L (ref 2.0–7.5)
NEUTROPHILS RELATIVE PERCENT: 55 %
PLATELET COUNT: 231 10*9/L (ref 150–440)
RED BLOOD CELL COUNT: 3.93 10*12/L — ABNORMAL LOW (ref 4.00–5.20)
RED CELL DISTRIBUTION WIDTH: 16.6 % — ABNORMAL HIGH (ref 12.0–15.0)
WBC ADJUSTED: 3.8 10*9/L — ABNORMAL LOW (ref 4.5–11.0)

## 2018-11-24 LAB — BILIRUBIN TOTAL: Bilirubin:MCnc:Pt:Ser/Plas:Qn:: 0.5

## 2018-11-24 LAB — LYMPHOCYTES ABSOLUTE COUNT: Lab: 1.1 — ABNORMAL LOW

## 2018-11-24 NOTE — Unmapped (Signed)
Pharmacist Phone Visit Follow-Up    Cancer Team  Medical Oncology: Dr. 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  She is having abemaciclib-induced diarrhea and cramping.  She is using Imodium appropriately to tolerate the diarrhea.  Labs reviewed.  WBC decreased and SCr increased minimally due to abemaciclib.  LFTs WNL.  No dose adjustments are necessary at this time.  If her labs are stable in 2 weeks she can then go to monthly labs.    Plan  1. Continue abemaciclib 150 mg BID with fulvestrant and denosumab  2. Continue Imodium for diarrhea and simethicone for cramping     F/U  12/08/18 - Labs and fulvestrant injection  ________________________________________________________________________     This is a Phone Visit, being conducted by telephone in lieu of an in-person visit during the COVID-19 pandemic.   Patient location during call: [facility]  Participants in the call, in addition to patient and provider: Husband  Reason for Visit: Oral chemotherapy management     Interval History   Susan Robbins is a 68 y.o. 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  I am calling her for a C2 D15 week follow-up.  I spoke to her and her husband, Susan Robbins.  She has been having multiple episodes of diarrhea, with some days worse then others.  She has cramping when she has normal stools but less or no cramping with loose stools  She has adjusted her diet to a low fiber diet to help with the diarrhea.  She seems to still be tolerating the diarrhea and willing to deal with this adverse effect of abemaciclib.  Overall she feels good and continues to walk 3-4 miles per day.    Oral chemotherapy regimen: Abemaciclib 150 mg PO twice daily with fulvestrant  Specialty Pharmacy:  [facility] specialty pharmacy  Start Date: 10/13/18    Adverse effects  1. Diarrhea - Has diarrhea consistently and using Imodium to treat it.  Appropriately has adjusted to a low fiber diet.  I encouraged her to continue to use Imodium and the low fiber diet to reduce diarrhea.  2. Cramping - has mild/moderate cramping and occasionally has severe cramping with a normal stool.  Simethicone is somewhat helpful.  Since having loose stools is less painful, her goal with imodium is to not have normal bowel movements..  3. Nausea w/o emesis - Has mild nausea- stable.    Labs  1. CBC/diff - adequate to continue abemaciclib  2. SCr - slightly increased.  This is a common adverse effect of abemaciclib.  No modifications recommended.  Will monitor.  3. LFTs - WNL    Adherence - No missed doses.  She uses the alarm on her iphone to remember to take her medication    Drug interactions - No new medications for assessment.    Patient verbalized understanding of the above information.     Approximate time spent on phone with patient: 35 minutes    There is no height or weight on file to calculate BSA.   There were no vitals taken for this visit.     Wt Readings from Last 3 Encounters:  11/10/18 57.6 kg (127 lb)   09/29/18 59 kg (130 lb 1.6 oz)   05/26/18 59.6 kg (131 lb 4.8 oz)          Oncology History Overview Note   Seen at [facility]     Metastatic breast cancer (CMS-HCC)   06/1989 -  Other    Diagnosed with right breast DCIS     12/1997 Initial Diagnosis    Left BRCA-associated breast cancer - Stg II, 4 positive nodes, ER+. S/p bilateral mastectomy w/ recon     01/1998 - 05/1998 Chemotherapy    AC x 6 cycles and Taxol x 3 (stopped early d/t neuropathy). Dr. Cyndie Chime.     07/1998 - 08/1998 Radiation    Adjuvant radiation therapy      09/1998 Endocrine/Hormone Therapy    Started on Tamoxifen, completed 5 years 09/2003. Unable to tolerate AI      Other    BRCA testing - BRCA 2 mutation. Had hysterectomy and bilateral salpingo-oophorectomy in May 2017. Undergone EUS and seen by GI     11/2016 -  Other    See  in [facility] for assessment of ? GI bleed prompting imaging - found to not be blood (beets) but imaging found evidence disease recurrence     11/30/2016 Interval Scan(s)    CT c/a/p -c/w peritoneal carcinomatosis with peritoneal thickening and enhancement as well as mild ascites. Vague suspicious liver hypodensities. PET 9/11:  Several small hypermetabolic osseous metastases scattered in the thoracolumbar spine.  2. Widespread hypermetabolic peritoneal carcinomatosis throughout the peritoneal cavity as detailed. Small volume pelvic ascites.  3. Hypermetabolic left adrenal metastasis.  4. Hypermetabolic mediastinal nodal metastases.  5. Hypermetabolic inferior left axillary soft tissue mass adjacent  to surgical clips, compatible with soft tissue metastasis.  6. Small dependent right pleural effusion.     12/02/2016 Biopsy    Peritoneal soft tissue biopsy - metastatic lobular carcinoma. ER 100%, PR 10%, and HER2 negative (1+)     12/10/2016 Endocrine/Hormone Therapy    Letrozole / palbociclib initiated (palbo started 01/2017). Dose reduced palbo to 100mg  for persistent grade 3 neutropenia 05/2017     12/30/2016 Genetics      Results revealed patient has the following mutation(s): Somatic tumor testing by [facility]:  BRCA2 positive   PIK3CA [id number]  CCND1 amplification  FGFR1 amplification  CDH1 V171fs'36  FGF19 amplification  FGF3 amplification  FGF4 amplification  MGL1 amplificiation-equivocal   NSD3 (WHSC169)amplification  NTRK1 amplificiation- equivocal  TP53 V157G-subclonal  [id number] amplification      03/2017 -  Research Study Participant    HARMONY - original 1999 tumor destroyed. Met inadequate / used up       10/13/2018 -  Chemotherapy    OP BREAST FULVESTRANT / abemaciclib  Fulvestrant 500 mg IM Day 1 and 15 of Cycle 1, then Day 1 of each cycle thereafter  Palbociclib 125 mg PO daily for 3 weeks on, followed by 1 week off  28-day cycle          Pertinent Labs:   Lab on 11/24/2018   Component Date Value Ref Range Status   ??? Sodium 11/24/2018 135  135 - 145 mmol/L Final   ??? Potassium 11/24/2018 4.6  3.5 - 5.0 mmol/L Final   ??? Chloride 11/24/2018 99  98 - 107 mmol/L Final   ??? Anion Gap 11/24/2018 6* 7 - 15 mmol/L Final   ??? CO2 11/24/2018 30.0  22.0 - 30.0 mmol/L Final   ??? BUN  11/24/2018 15  7 - 21 mg/dL Final   ??? Creatinine 11/24/2018 1.07* 0.60 - 1.00 mg/dL Final   ??? BUN/Creatinine Ratio 11/24/2018 14   Final   ??? EGFR CKD-EPI Non-African American,* 11/24/2018 53* >=60 mL/min/1.70m2 Final   ??? EGFR CKD-EPI African American, Fem* 11/24/2018 61  >=60 mL/min/1.55m2 Final   ??? Glucose 11/24/2018 92  70 - 179 mg/dL Final   ??? Calcium 16/01/9603 9.4  8.5 - 10.2 mg/dL Final   ??? Albumin 54/12/8117 4.0  3.5 - 5.0 g/dL Final   ??? Total Protein 11/24/2018 7.3  6.5 - 8.3 g/dL Final   ??? Total Bilirubin 11/24/2018 0.5  0.0 - 1.2 mg/dL Final   ??? AST 14/78/2956 23  14 - 38 U/L Final   ??? ALT 11/24/2018 13  <35 U/L Final   ??? Alkaline Phosphatase 11/24/2018 41  38 - 126 U/L Final   ??? WBC 11/24/2018 3.8* 4.5 - 11.0 10*9/L Final   ??? RBC 11/24/2018 3.93* 4.00 - 5.20 10*12/L Final   ??? HGB 11/24/2018 12.3  12.0 - 16.0 g/dL Final   ??? HCT 21/30/8657 35.7* 36.0 - 46.0 % Final   ??? MCV 11/24/2018 90.9  80.0 - 100.0 fL Final   ??? MCH 11/24/2018 31.3  26.0 - 34.0 pg Final   ??? MCHC 11/24/2018 34.4  31.0 - 37.0 g/dL Final   ??? RDW 84/69/6295 16.6* 12.0 - 15.0 % Final   ??? MPV 11/24/2018 6.7* 7.0 - 10.0 fL Final   ??? Platelet 11/24/2018 231  150 - 440 10*9/L Final   ??? Neutrophils % 11/24/2018 55.0  % Final   ??? Lymphocytes % 11/24/2018 27.8  % Final   ??? Monocytes % 11/24/2018 4.7  % Final   ??? Eosinophils % 11/24/2018 7.0  % Final   ??? Basophils % 11/24/2018 2.1  % Final   ??? Absolute Neutrophils 11/24/2018 2.1  2.0 - 7.5 10*9/L Final   ??? Absolute Lymphocytes 11/24/2018 1.1* 1.5 - 5.0 10*9/L Final   ??? Absolute Monocytes 11/24/2018 0.2  0.2 - 0.8 10*9/L Final   ??? Absolute Eosinophils 11/24/2018 0.3  0.0 - 0.4 10*9/L Final   ??? Absolute Basophils 11/24/2018 0.1  0.0 - 0.1 10*9/L Final   ??? Large Unstained Cells 11/24/2018 3  0 - 4 % Final   ??? Macrocytosis 11/24/2018 Slight* Not Present Final   ??? Anisocytosis 11/24/2018 Slight* Not Present Final       Current medications:     Current Outpatient Medications   Medication Sig Dispense Refill   ??? abemaciclib (VERZENIO) 150 mg Tab tablet Take 1 tablet (150 mg total) by mouth two (2) times a day with or without food. 56 tablet 5   ??? azithromycin (AZASITE) 1 % ophthalmic solution Azasite 1 % eye drops   PALCE 1 DROP IN BOTH EYES AS DIRECTED     ??? calcium carbonate (TUMS) 200 mg calcium (500 mg) chewable tablet Tums     ??? cholecalciferol, vitamin D3, 2,000 unit cap Vitamin D3 2,000 unit capsule   Take by oral route.     ??? clobetasol (TEMOVATE) 0.05 % external solution clobetasol 0.05 % scalp solution   1 APPLICATION APPLY ON THE SKIN AT BEDTIME APPLY AT BEDTIME TO SPOT ON SCALP FOR 2 WEEKS     ??? cyanocobalamin, vitamin B-12, (VITAMIN B-12 INJ) Inject as directed every thirty (30) days.     ??? cyclobenzaprine (FLEXERIL) 5 MG tablet Take 1 tablet (5 mg total) by mouth Three (3) times  a day as needed for muscle spasms. 60 tablet 0   ??? docusate sodium (COLACE) 100 MG capsule Take 100 mg by mouth two (2) times a day as needed.     ??? ergocalciferol, vitamin D2, (VITAMIN D2 ORAL) Take 50,000 Units by mouth every seven (7) days.     ??? hyoscyamine sulfate (LEVSIN) 0.125 mg tablet Place 0.125 mg under the tongue daily as needed.     ??? ibuprofen (ADVIL,MOTRIN) 200 MG tablet Take 200 mg by mouth every six (6) hours as needed.     ??? Lactobacillus acidophilus (PROBIOTIC ORAL) Take by mouth.     ??? levothyroxine (SYNTHROID, LEVOTHROID) 75 MCG tablet Take 75 mcg by mouth daily.     ??? loperamide (IMODIUM A-D) 2 mg tablet Takes 2 tabs with first loose stool and then 1 tablet with each subsequent loose stool 30 tablet 5   ??? ondansetron (ZOFRAN) 8 MG tablet Take 1 tablet (8 mg total) by mouth every eight (8) hours as needed for nausea. 30 tablet 2   ??? pantoprazole (PROTONIX) 40 MG tablet Take 40 mg by mouth daily as needed.     ??? senna (SENOKOT) 8.6 mg tablet Take 1 tablet by mouth daily.     ??? triamcinolone (KENALOG) 0.1 % lotion Apply topically Three (3) times a day.     ??? valACYclovir (VALTREX) 500 MG tablet two (2) times a day as needed.       No current facility-administered medications for this visit.      Facility-Administered Medications Ordered in Other Visits   Medication Dose Route Frequency Provider Last Rate Last Dose   ??? denosumab (XGEVA) 120 mg/1.7 mL (70 mg/mL) injection            ??? denosumab (XGEVA) 120 mg/1.7 mL (70 mg/mL) injection            ??? denosumab (XGEVA) 120 mg/1.7 mL (70 mg/mL) injection

## 2018-11-24 NOTE — Unmapped (Signed)
Labs drawn peripherally from left [facility].  Pt ambulatory from lab.

## 2018-11-26 LAB — CA 27-29: Lab: 376.1 — ABNORMAL HIGH

## 2018-12-07 NOTE — Unmapped (Signed)
Breast Cancer Return Patient Evaluation    PCP: Donivan Scull, MD   PCP ([place]): Dr. [name] [phone] (fax # [phone])  [facility]: Chalmers Cater, MD ([facility], [place]) - send notes     Reason for Visit: 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  See OncHistory for details. Letrozole initiated 12/2016, Ibrance initiated 01/29/17. DR Ilda Foil to 100mg  05/2017 secondary to neutropenia. Continued on letrozole only d/t concern for MSK side effects from [place].     Patient was living in [facility] given COVID-19 pandemic but has recently moved back to [facility] for the summer and will receive care here. She stopped the letrozole on 5/22 due to rising Ca27.29 levels in [place]. Restaging CT c/a/p from 09/27/18 showed overt progression in the liver and peritoneum and most recent Ca27-29 however has now more than doubled to 313.1. Started on fulvestrant + abemaciclib.     Tolerating abemaciclib well with the exception of abdominal cramping and diarrhea. Abdominal cramping now improved. Diarrhea starting to improve, taking imodium once daily on average but continuing to be very cautious with diet and to avoid high fiber foods.    -- Here today for cycle 3 fulvestrant + abemaciclib, fulvestrant given today  -- Continue Xgeva monthly, given today  -- CA 27-29: on 11/24/18 was 376.1, todays level still pending  -- Staging scans scheduled for 12/31/18 with return visit 01/05/19  -- Has inquired about receiving faslodex in [facility] when returns to [place] for the winter, as she plans to stay there ~5 months and is not sure whether she will be able to travel to [facility] for injections given COVID, will discuss with clinical pharmacist    Future Options:   1. Olaparib or talazoparib (gBRCA2 mutation)   2. Alpelisib + ET (PIK3CA mutation)   3. Clinical trial: olaparib + immunotherapy. She has a HR+ cancer which tends not to be immune activated; the trial builds upon data that PARPi can induce immune activation / infiltration that might synergize with ICI. This would require biopsies of the liver lesions as well as more visits since she will be returning per protocol.    Genomics  - consented for HARMONY trial - however original tumor destroyed, insufficient 2018 sample - may consider biopsy at next evidence PD.   - FM1 testing (liquid and bx test from 2018) showed BRCA2 mutation + PIK3CA    Dermatology. Skin damage with a plan for topical fluorouracil. Within reason, should not interfere with her breast cancer therapy.     Neuro.  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 Monitor.     Dispo.   --Cycle 3 Faslodex + abemaciclib today, will also get Xgeva  --RTC in 2 weeks for labs + f/u with Aimee [place]  --Staging scans 12/31/18, RTC 01/05/19 for scan review and next falsodex and xgeva    Mrs. Gilbert was seen and discussed with Dr. Jeannett Senior Niki Cosman MD  Medical Oncology Fellow PGY6  ------------------------------------     Interval Medical / Social History:  She is here accompanied by her husband for follow-up prior to C3 of fulvestrant + abemaciclib    -- Started fulvestrant + abemaciclib on 7/1, overall tolerating ok  -- Having diarrhea 1-2 times a day, takes imodium one time per day prn. Very rarely takes 2 imodium in same day. Diarrhea improved in past several days.  --  Abdominal cramping improved from prior  -- Mass in LLQ less prominent since abdominal cramping improved, Mrs. Mcgough states that she finds this encouraging and that it may represent a lessening in the amount of peritoneal fluid she has  -- Overall feels well with an excellent energy level, able to walk >4 miles daily    Labs: CMP ok. CBC ok.   Tumor markers: Ca27.29 has doubled from 170 (08/26/18)--> 313.1 (09/27/18) --> 350.7 (16109) --> 419.8 (11/10/18) --> 376.1 on 11/24/18-->todays labs pending    Staging Scans:  - 09/27/18 NM/BS with new, indeterminate focus tracer uptake along the medial L clavicle, otherwise stable osseous disease. CT A/P reveals increased in size and number of hepatic metastases, increased ascites, and increased peritoneal carcinomatosis. CT Chest shows new small R pleural effusion but otherwise stable disease.      Social:  - Has seen Dr. Chalmers Cater in [place] in past  - Lives mostly in [place] - sees PCP Dr. [name] [phone]  - Married 45+ years    Oncology History Overview Note   Seen at [facility]     Metastatic breast cancer (CMS-HCC)   06/1989 -  Other    Diagnosed with right breast DCIS     12/1997 Initial Diagnosis    Left BRCA-associated breast cancer - Stg II, 4 positive nodes, ER+. S/p bilateral mastectomy w/ recon     01/1998 - 05/1998 Chemotherapy    AC x 6 cycles and Taxol x 3 (stopped early d/t neuropathy). Dr. Cyndie Chime.     07/1998 - 08/1998 Radiation    Adjuvant radiation therapy      09/1998 Endocrine/Hormone Therapy    Started on Tamoxifen, completed 5 years 09/2003. Unable to tolerate AI      Other    BRCA testing - BRCA 2 mutation. Had hysterectomy and bilateral salpingo-oophorectomy in May 2017. Undergone EUS and seen by GI     11/2016 -  Other    See  in [facility] for assessment of ? GI bleed prompting imaging - found to not be blood (beets) but imaging found evidence disease recurrence     11/30/2016 Interval Scan(s)    CT c/a/p -c/w peritoneal carcinomatosis with peritoneal thickening and enhancement as well as mild ascites. Vague suspicious liver hypodensities. PET 9/11:  Several small hypermetabolic osseous metastases scattered in the thoracolumbar spine.  2. Widespread hypermetabolic peritoneal carcinomatosis throughout the peritoneal cavity as detailed. Small volume pelvic ascites.  3. Hypermetabolic left adrenal metastasis.  4. Hypermetabolic mediastinal nodal metastases.  5. Hypermetabolic inferior left axillary soft tissue mass adjacent  to surgical clips, compatible with soft tissue metastasis.  6. Small dependent right pleural effusion.     12/02/2016 Biopsy    Peritoneal soft tissue biopsy - metastatic lobular carcinoma. ER 100%, PR 10%, and HER2 negative (1+)     12/10/2016 Endocrine/Hormone Therapy    Letrozole / palbociclib initiated (palbo started 01/2017). Dose reduced palbo to 100mg  for persistent grade 3 neutropenia 05/2017     12/30/2016 Genetics      Results revealed patient has the following mutation(s): Somatic tumor testing by FN1:  BRCA2 positive   PIK3CA [id number]  CCND1 amplification  FGFR1 amplification  CDH1 V152fs'36  FGF19 amplification  FGF3 amplification  FGF4 amplification  MGL1 amplificiation-equivocal   NSD3 (WHSC169)amplification  NTRK1 amplificiation- equivocal  TP53 V157G-subclonal  [id number] amplification      03/2017 -  Research Study Participant    HARMONY - original 1999 tumor destroyed.  Met inadequate / used up       10/13/2018 -  Chemotherapy    OP BREAST FULVESTRANT / abemaciclib  Fulvestrant 500 mg IM Day 1 and 15 of Cycle 1, then Day 1 of each cycle thereafter  Palbociclib 125 mg PO daily for 3 weeks on, followed by 1 week off  28-day cycle       Past Medical History:   Diagnosis Date   ??? BRCA positive     germline BRCA2   ??? Breast cancer (CMS-HCC) 1999   ??? Disease of thyroid gland     hypothyroidism   ??? DVT (deep venous thrombosis) (CMS-HCC) 1973    thought provoked by accident.    ??? Hypertension    ??? Osteoporosis     on Prolia until metastatic breast cancer diagnosis     Gyn History: menopause age 25 with chemotherapy.     Past Surgical History:   Procedure Laterality Date   ??? HYSTERECTOMY     ??? MASTECTOMY Bilateral 1999     Current Outpatient Medications   Medication Sig Dispense Refill   ??? abemaciclib (VERZENIO) 150 mg Tab tablet Take 1 tablet (150 mg total) by mouth two (2) times a day with or without food. 56 tablet 5   ??? azithromycin (AZASITE) 1 % ophthalmic solution Azasite 1 % eye drops   PALCE 1 DROP IN BOTH EYES AS DIRECTED     ??? calcium carbonate (TUMS) 200 mg calcium (500 mg) chewable tablet Tums     ??? cholecalciferol, vitamin D3, 2,000 unit cap Vitamin D3 2,000 unit capsule   Take by oral route.     ??? clobetasol (TEMOVATE) 0.05 % external solution clobetasol 0.05 % scalp solution   1 APPLICATION APPLY ON THE SKIN AT BEDTIME APPLY AT BEDTIME TO SPOT ON SCALP FOR 2 WEEKS     ??? cyanocobalamin, vitamin B-12, (VITAMIN B-12 INJ) Inject as directed every thirty (30) days.     ??? cyclobenzaprine (FLEXERIL) 5 MG tablet Take 1 tablet (5 mg total) by mouth Three (3) times a day as needed for muscle spasms. 60 tablet 0   ??? docusate sodium (COLACE) 100 MG capsule Take 100 mg by mouth two (2) times a day as needed.     ??? ergocalciferol, vitamin D2, (VITAMIN D2 ORAL) Take 50,000 Units by mouth every seven (7) days.     ??? hydrocortisone 1 % cream Apply topically daily as needed.     ??? hyoscyamine sulfate (LEVSIN) 0.125 mg tablet Place 0.125 mg under the tongue daily as needed.     ??? ibuprofen (ADVIL,MOTRIN) 200 MG tablet Take 200 mg by mouth every six (6) hours as needed.     ??? Lactobacillus acidophilus (PROBIOTIC ORAL) Take by mouth.     ??? levothyroxine (SYNTHROID, LEVOTHROID) 75 MCG tablet Take 75 mcg by mouth daily.     ??? loperamide (IMODIUM A-D) 2 mg tablet Takes 2 tabs with first loose stool and then 1 tablet with each subsequent loose stool 30 tablet 5   ??? ondansetron (ZOFRAN) 8 MG tablet Take 1 tablet (8 mg total) by mouth every eight (8) hours as needed for nausea. 30 tablet 2   ??? pantoprazole (PROTONIX) 40 MG tablet Take 40 mg by mouth daily as needed.     ??? senna (SENOKOT) 8.6 mg tablet Take 1 tablet by mouth daily.     ??? triamcinolone (KENALOG) 0.1 % lotion Apply topically Three (3) times a day.     ???  valACYclovir (VALTREX) 500 MG tablet two (2) times a day as needed.       No current facility-administered medications for this visit.      Facility-Administered Medications Ordered in Other Visits   Medication Dose Route Frequency Provider Last Rate Last Dose   ??? denosumab (XGEVA) 120 mg/1.7 mL (70 mg/mL) injection            ??? denosumab (XGEVA) 120 mg/1.7 mL (70 mg/mL) injection            ??? denosumab (XGEVA) 120 mg/1.7 mL (70 mg/mL) injection              Allergies   Allergen Reactions   ??? Berry Flavor Nausea And Vomiting     All berries   ??? Heparin Analogues Hives     Hives in the past but has had heparin recently with  No problems   ??? Penicillins Other (See Comments)     Reaction unknown occurred while in college  Has patient had a PCN reaction causing immediate rash, facial/tongue/throat swelling, SOB or lightheadedness with hypotension: reaction unknown  Has patient had a PCN reaction causing severe rash involving mucus membranes or skin necrosis: reaction unknown  Has patient had a PCN reaction that required hospitalization no  Has patient had a PCN reaction occurring within the last 10 years: no  If all of the above answers are NO, then may proceed with Cep  Reaction unknown occurred while in college  Has patient had a PCN reaction causing immediate rash, facial/tongue/throat swelling, SOB or lightheadedness with hypotension: reaction unknown  Has patient had a PCN reaction causing severe rash involving mucus membranes or skin necrosis: reaction unknown  Has patient had a PCN reaction that required hospitalization no  Has patient had a PCN reaction occurring within the last 10 years: no  If all of the above answers are NO, then may proceed with Cep   ??? Oxycodone Nausea Only     Social History     Social History Narrative    Lives in [place] several months per year with husband [name], however primary residence is [place] (49m / yr, has an excellent PCP who has offered to help manage care while down there) and they spend up to 2 months in a family home in [place], [place].      Family History   Problem Relation Age of Onset   ??? Pancreatic cancer Father 1        died in 4 months    ??? BRCA 1/2 Sister         BRCA 2+   ??? BRCA 1/2 Brother BRCA 2+ (2 of 7 brothers are positive)    ??? BRCA 1/2 Brother         BRCA 2+     Review of Systems: A 12-system review of systems was obtained including: Constitutional, Eyes, ENT, Cardiovascular, Respiratory, GI, GU, Musculoskeletal, Skin, Neurological, Psychiatric, Endocrine, Heme/Lymphatic, and Allergic/Immunologic systems. It is negative or non-contributory to the patient???s management except as noted in the history above. ROS performed over the phone.     Physical Examination:   Vitals:    12/08/18 1155   BP: 153/72   Pulse: 77   Resp: 18   Temp: 36.7 ??C (98 ??F)   SpO2: 98%     General:  Healthy-appearing female in no acute distress, accompanied by husband  Cardiovasc:  No heaves, regular, no additional sounds. No lower extremity edema  Respiratory:  Chest  clear to percussion and auscultation, unlabored  Gastrointestinal:  Soft, nontender, no hepatomegaly. Normal bowel sounds. Nodule deep into the abdomen to the left and superior of umbilicus difficult to clearly palpate today (from prior exam: feels like might be in the abdominal musculature wall, indistinct, 3 x 3.5 cm, non tender)  Musculoskeletal:  No bony pain or tenderness  Skin and Subcutaneous Tissues: Patchy skin lesions scattered on her face, chronic   Psychiatric: Mood is normal.  No other symptoms.   Neuro:  Alert and oriented, no focal neurologic deficits  Upper Extremity Lymphedema: None  Breast/Chest: bilateral mastectomy without CW disease  Heme/Lymphatic/Immunologic: B/l axilla and supraclav ok. - unchanged exam today     I have personally reviewed the following diagnostic studies:    Breast imaging and pathology reviewed. See Results for details    ______________________________________________________________________    Documentation assistance was provided by Aundria Mems, Scribe, on December 07, 2018 at 9:42 AM for Joylene John, MD ----------------------------------------------------------------------------------------------------------------------  December 08, 2018 11:28 PM. Documentation assistance provided by the Scribe. I was present during the time the encounter was recorded. The information recorded by the Scribe was done at my direction and has been reviewed and validated by me.  ----------------------------------------------------------------------------------------------------------------------

## 2018-12-08 ENCOUNTER — Ambulatory Visit
Admit: 2018-12-08 | Discharge: 2018-12-09 | Payer: MEDICARE | Attending: Hematology & Oncology | Primary: Hematology & Oncology

## 2018-12-08 ENCOUNTER — Other Ambulatory Visit: Admit: 2018-12-08 | Discharge: 2018-12-09 | Payer: MEDICARE

## 2018-12-08 DIAGNOSIS — C50919 Malignant neoplasm of unspecified site of unspecified female breast: Principal | ICD-10-CM

## 2018-12-08 LAB — CBC W/ AUTO DIFF
EOSINOPHILS ABSOLUTE COUNT: 0.1 10*9/L (ref 0.0–0.4)
EOSINOPHILS RELATIVE PERCENT: 3.7 %
HEMATOCRIT: 36.5 % (ref 36.0–46.0)
HEMOGLOBIN: 12.2 g/dL (ref 12.0–16.0)
LARGE UNSTAINED CELLS: 3 % (ref 0–4)
LYMPHOCYTES ABSOLUTE COUNT: 1 10*9/L — ABNORMAL LOW (ref 1.5–5.0)
LYMPHOCYTES RELATIVE PERCENT: 29.1 %
MEAN CORPUSCULAR HEMOGLOBIN CONC: 33.5 g/dL (ref 31.0–37.0)
MEAN CORPUSCULAR HEMOGLOBIN: 30.6 pg (ref 26.0–34.0)
MEAN CORPUSCULAR VOLUME: 91.3 fL (ref 80.0–100.0)
MEAN PLATELET VOLUME: 6.8 fL — ABNORMAL LOW (ref 7.0–10.0)
MONOCYTES ABSOLUTE COUNT: 0.2 10*9/L (ref 0.2–0.8)
MONOCYTES RELATIVE PERCENT: 5.1 %
NEUTROPHILS ABSOLUTE COUNT: 2 10*9/L (ref 2.0–7.5)
NEUTROPHILS RELATIVE PERCENT: 57.3 %
PLATELET COUNT: 237 10*9/L (ref 150–440)
RED BLOOD CELL COUNT: 4 10*12/L (ref 4.00–5.20)
RED CELL DISTRIBUTION WIDTH: 17.4 % — ABNORMAL HIGH (ref 12.0–15.0)
WBC ADJUSTED: 3.5 10*9/L — ABNORMAL LOW (ref 4.5–11.0)

## 2018-12-08 LAB — COMPREHENSIVE METABOLIC PANEL
ALBUMIN: 4.1 g/dL (ref 3.5–5.0)
ALKALINE PHOSPHATASE: 43 U/L (ref 38–126)
ALT (SGPT): 17 U/L (ref <35)
ANION GAP: 7 mmol/L (ref 7–15)
AST (SGOT): 26 U/L (ref 14–38)
BILIRUBIN TOTAL: 0.8 mg/dL (ref 0.0–1.2)
BLOOD UREA NITROGEN: 16 mg/dL (ref 7–21)
BUN / CREAT RATIO: 17
CALCIUM: 9.8 mg/dL (ref 8.5–10.2)
CHLORIDE: 98 mmol/L (ref 98–107)
CO2: 31 mmol/L — ABNORMAL HIGH (ref 22.0–30.0)
CREATININE: 0.93 mg/dL (ref 0.60–1.00)
EGFR CKD-EPI AA FEMALE: 72 mL/min/{1.73_m2} (ref >=60)
EGFR CKD-EPI NON-AA FEMALE: 62 mL/min/{1.73_m2} (ref >=60)
GLUCOSE RANDOM: 91 mg/dL (ref 70–179)
POTASSIUM: 4.4 mmol/L (ref 3.5–5.0)
SODIUM: 136 mmol/L (ref 135–145)

## 2018-12-08 LAB — BILIRUBIN TOTAL: Bilirubin:MCnc:Pt:Ser/Plas:Qn:: 0.8

## 2018-12-08 LAB — MEAN CORPUSCULAR HEMOGLOBIN: Lab: 30.6

## 2018-12-08 LAB — PHOSPHORUS: Phosphate:MCnc:Pt:Ser/Plas:Qn:: 3.6

## 2018-12-08 NOTE — Unmapped (Signed)
It was a pleasure to see you today in the Breast Medical Oncology Clinic.      For clinical concerns during working hours, please call [phone]. My nurse navigators are Lucendia Herrlich, RN and Modena Jansky, RN.    For clinical trial questions please call the study coordinator.     For emergencies, evenings or weekends, please call [phone] and ask for the oncology fellow on call.  Reasons to call the emergency line may include:  - Fever of 100.5 or greater  - Nausea and/or vomiting not relieved with nausea medicine  - Diarrhea or constipation not relieved with bowel regimen  - Severe pain not relieved with usual pain regimen    ______________________________________________________________    [facility] on COVID-19    We are facing a challenge we have never seen before with COVID-19, the new coronavirus making its way around the world and the [place] Please be assured we are focused on providing you and your loved ones with the best possible cancer care and new treatments in the safest way possible.    There are several changes to our day-to-day activities at the [facility], the clinical home of [facility].  We made two videos to help describe the ways we are working to keep you safe, such as offering the option to visit your care team over the phone or through a video, as well as support services we continue to offer to our patients and their caregivers.    Video #1: Keeping [facility] patients safe during the COVID-19 crisis  http://go.eabjmlille.com     Video #2: Support for cancer patients and their caregivers during the COVID-19 pandemic  [email or web address].uy    We are dedicated to providing you the best care and support ??? and in the safest manner possible ??? during your cancer journey.  If you have any questions about your cancer care, please call your care team.

## 2018-12-08 NOTE — Unmapped (Signed)
I saw and evaluated the patient, participating in the key portions of the service.  I reviewed the resident???s note.  I agree with the resident???s findings and plan. She is doing well on fulvestrant + abemaciclib + denosumab.  Cycle 3 today, some abdominal complaints with abema that are resolving. Due restaging 12/31/2018.  [name], MD

## 2018-12-08 NOTE — Unmapped (Signed)
Labs drawn and sent.  Care provided by [facility], phleb.

## 2018-12-09 LAB — CA 27-29: Lab: 280.2 — ABNORMAL HIGH

## 2018-12-27 MED ORDER — CYCLOBENZAPRINE 5 MG TABLET
ORAL_TABLET | Freq: Three times a day (TID) | ORAL | 0 refills | 20 days | Status: CP | PRN
Start: 2018-12-27 — End: ?

## 2018-12-31 ENCOUNTER — Ambulatory Visit: Admit: 2018-12-31 | Discharge: 2019-01-11 | Payer: MEDICARE

## 2018-12-31 ENCOUNTER — Ambulatory Visit: Admit: 2018-12-31 | Discharge: 2019-01-24 | Payer: MEDICARE

## 2018-12-31 DIAGNOSIS — C50112 Malignant neoplasm of central portion of left female breast: Secondary | ICD-10-CM

## 2018-12-31 DIAGNOSIS — C786 Secondary malignant neoplasm of retroperitoneum and peritoneum: Secondary | ICD-10-CM

## 2018-12-31 DIAGNOSIS — C50919 Malignant neoplasm of unspecified site of unspecified female breast: Secondary | ICD-10-CM

## 2018-12-31 DIAGNOSIS — K669 Disorder of peritoneum, unspecified: Secondary | ICD-10-CM

## 2018-12-31 DIAGNOSIS — M7989 Other specified soft tissue disorders: Secondary | ICD-10-CM

## 2018-12-31 DIAGNOSIS — K769 Liver disease, unspecified: Secondary | ICD-10-CM

## 2018-12-31 DIAGNOSIS — E279 Disorder of adrenal gland, unspecified: Secondary | ICD-10-CM

## 2018-12-31 DIAGNOSIS — C50912 Malignant neoplasm of unspecified site of left female breast: Secondary | ICD-10-CM

## 2018-12-31 DIAGNOSIS — Z170 Estrogen receptor positive status [ER+]: Secondary | ICD-10-CM

## 2018-12-31 DIAGNOSIS — C7951 Secondary malignant neoplasm of bone: Secondary | ICD-10-CM

## 2019-01-03 DIAGNOSIS — C50919 Malignant neoplasm of unspecified site of unspecified female breast: Secondary | ICD-10-CM

## 2019-01-03 MED ORDER — ABEMACICLIB 150 MG TABLET
ORAL_TABLET | Freq: Two times a day (BID) | ORAL | 11 refills | 28 days | Status: CP
Start: 2019-01-03 — End: ?

## 2019-01-05 ENCOUNTER — Ambulatory Visit
Admit: 2019-01-05 | Discharge: 2019-01-05 | Payer: MEDICARE | Attending: Hematology & Oncology | Primary: Hematology & Oncology

## 2019-01-05 ENCOUNTER — Ambulatory Visit: Admit: 2019-01-05 | Discharge: 2019-01-05 | Payer: MEDICARE

## 2019-01-05 ENCOUNTER — Telehealth: Admit: 2019-01-05 | Discharge: 2019-01-05 | Payer: MEDICARE | Attending: Adult Health | Primary: Adult Health

## 2019-01-05 ENCOUNTER — Other Ambulatory Visit: Admit: 2019-01-05 | Discharge: 2019-01-05 | Payer: MEDICARE

## 2019-01-05 DIAGNOSIS — C50919 Malignant neoplasm of unspecified site of unspecified female breast: Secondary | ICD-10-CM

## 2019-01-05 DIAGNOSIS — Z23 Encounter for immunization: Secondary | ICD-10-CM

## 2019-01-05 LAB — BASOPHILS ABSOLUTE COUNT: Lab: 0.1

## 2019-01-05 LAB — CBC W/ AUTO DIFF
BASOPHILS ABSOLUTE COUNT: 0.1 10*9/L (ref 0.0–0.1)
BASOPHILS RELATIVE PERCENT: 1.8 %
EOSINOPHILS ABSOLUTE COUNT: 0.2 10*9/L (ref 0.0–0.4)
EOSINOPHILS RELATIVE PERCENT: 4.2 %
HEMATOCRIT: 34.9 % — ABNORMAL LOW (ref 36.0–46.0)
HEMOGLOBIN: 11.8 g/dL — ABNORMAL LOW (ref 12.0–16.0)
LARGE UNSTAINED CELLS: 4 % (ref 0–4)
LYMPHOCYTES RELATIVE PERCENT: 21.2 %
MEAN CORPUSCULAR HEMOGLOBIN CONC: 33.9 g/dL (ref 31.0–37.0)
MEAN CORPUSCULAR HEMOGLOBIN: 32 pg (ref 26.0–34.0)
MEAN PLATELET VOLUME: 6.8 fL — ABNORMAL LOW (ref 7.0–10.0)
MONOCYTES RELATIVE PERCENT: 5.9 %
NEUTROPHILS ABSOLUTE COUNT: 2.5 10*9/L (ref 2.0–7.5)
NEUTROPHILS RELATIVE PERCENT: 63.2 %
PLATELET COUNT: 231 10*9/L (ref 150–440)
RED BLOOD CELL COUNT: 3.7 10*12/L — ABNORMAL LOW (ref 4.00–5.20)
RED CELL DISTRIBUTION WIDTH: 18.6 % — ABNORMAL HIGH (ref 12.0–15.0)
WBC ADJUSTED: 4 10*9/L — ABNORMAL LOW (ref 4.5–11.0)

## 2019-01-05 LAB — COMPREHENSIVE METABOLIC PANEL
ALBUMIN: 4.2 g/dL (ref 3.5–5.0)
ALKALINE PHOSPHATASE: 46 U/L (ref 38–126)
ALT (SGPT): 16 U/L (ref <35)
ANION GAP: 11 mmol/L (ref 7–15)
AST (SGOT): 27 U/L (ref 14–38)
BILIRUBIN TOTAL: 0.8 mg/dL (ref 0.0–1.2)
BLOOD UREA NITROGEN: 12 mg/dL (ref 7–21)
BUN / CREAT RATIO: 14
CALCIUM: 8.9 mg/dL (ref 8.5–10.2)
CHLORIDE: 101 mmol/L (ref 98–107)
CO2: 26 mmol/L (ref 22.0–30.0)
EGFR CKD-EPI AA FEMALE: 81 mL/min/{1.73_m2} (ref >=60)
EGFR CKD-EPI NON-AA FEMALE: 71 mL/min/{1.73_m2} (ref >=60)
GLUCOSE RANDOM: 89 mg/dL (ref 70–179)
POTASSIUM: 4.2 mmol/L (ref 3.5–5.0)
PROTEIN TOTAL: 7.3 g/dL (ref 6.5–8.3)
SODIUM: 138 mmol/L (ref 135–145)

## 2019-01-05 LAB — PHOSPHORUS: Phosphate:MCnc:Pt:Ser/Plas:Qn:: 3.4

## 2019-01-05 LAB — CO2: Carbon dioxide:SCnc:Pt:Ser/Plas:Qn:: 26

## 2019-01-05 NOTE — Unmapped (Signed)
Labs collected via #[id number] butterfly.  To next appt.

## 2019-01-05 NOTE — Unmapped (Signed)
It was a pleasure to see you today in the Medical Oncology Clinic.    Please call our nurse navigator, Modena Jansky, if you have any interval questions or concerns:      Modena Jansky, RN: phone: [phone]     Prescription refills: [phone]     FAX: [phone]   ----------------------------------------------------------------------------------------------------  For health related questions call:   Please call NURSE TRIAGE LINE at [phone]    For appointments & questions Monday through Friday 8 AM???5 PM     Please call [phone] or Toll free [phone]    On Lovenia Kim and Holidays  Call [phone] and ask for the oncologist on call    For emergencies, evenings or weekends, please call [phone] and ask for the oncology fellow on call.     Reasons to call emergency line may include:   Fever of 100.5 or greater   Nausea and/or vomiting not relieved with nausea medicine   Diarrhea or constipation not relieved with bowel regimen   Severe pain not relieved with usual pain regimen     --------------------------------------------------------------------------------------------------------  Given the rapidly spreading coronavirus (COVID-19), we recommended you avoid travel and crowds, and wash your hands frequently.     If you develop fever, cough, shortness of breath and/or have known exposure (close contact < 6 feet) with someone who has tested positive please call the [facility] Coronavirus Helpline at [phone].         Carlos American, DNP, NP-C  Breast Medical Oncology  [facility] Hematology/Oncology  [address]  [place], [place] [address]

## 2019-01-05 NOTE — Unmapped (Signed)
BREAST MEDICAL ONCOLOGY - Arts administrator (in lieu of in-person visit during COVID-19 pandemic).     Visit Conducted via Video  Person Contacted: Patient   Contact Phone number: [phone]  Phone Outcome: Contacted Patient/Caregiver  Is there someone else in the room? YES (husband)    I spent 25 minutes on the real-time audio and video with the patient. I spent an additional 10 minutes on pre- and post-visit activities.     The patient was physically located in [place] or a state in which I am permitted to provide care. The patient and/or parent/guardian understood that s/he may incur co-pays and cost sharing, and agreed to the telemedicine visit. The visit was reasonable and appropriate under the circumstances given the patient's presentation at the time.    The patient and/or parent/guardian has been advised of the potential risks and limitations of this mode of treatment (including, but not limited to, the absence of in-person examination) and has agreed to be treated using telemedicine. The patient's/patient's family's questions regarding telemedicine have been answered.     If the visit was completed in an ambulatory setting, the patient and/or parent/guardian has also been advised to contact their provider???s office for worsening conditions, and seek emergency medical treatment and/or call 911 if the patient deems either necessary.       Patient location during call:  [facility]    Reason for Visit: Management of breast cancer.     ---------------------------------------------------------------------------------------------------------------------------------------------------------------------    Breast Cancer Return Patient Evaluation    PCP: Donivan Scull, MD   PCP [facility]): Dr. [name] [phone] (fax # [phone])  [facility]: Chalmers Cater, MD ([facility], [place]) - send notes     Reason for Visit: 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  See OncHistory for details. Letrozole initiated 12/2016, Ibrance initiated 01/29/17. DR Ilda Foil to 100mg  05/2017 secondary to neutropenia. Continued on letrozole only d/t concern for MSK side effects from [place].     Patient was living in [facility] given COVID-19 pandemic but has recently moved back to [facility] for the summer and will receive care here. She stopped the letrozole on 5/22 due to rising Ca27.29 levels in [place]. Restaging CT c/a/p from 09/27/18 showed overt progression in the liver and peritoneum and most recent Ca27-29 however has now more than doubled to 313.1. Started on fulvestrant + abemaciclib.     Tolerating abemaciclib well with the exception of abdominal cramping and diarrhea. Abdominal cramping now improved. Diarrhea starting to improve, taking imodium once daily on average but continuing to be very cautious with diet and to avoid high fiber foods.    Systemic Therapy:  -- Here today for cycle 4 fulvestrant + abemaciclib, fulvestrant given today  -- Continue Xgeva monthly, given today  -- Restaging scan CT/BS 12/31/18: overall stable with decrease in some osseous lesions and in liver mets, also decreased ascites. Stable peritoneal carcinomatosis.   -- Has inquired about receiving faslodex in [place] when returns to [place] for the winter, as she plans to stay there ~5 months and is not sure whether she will be able to travel to [facility] for injections given COVID, will discuss with clinical pharmacist    Tumor Markers:  -- CA 27-29: on 11/24/18 was 376.1 --> 12/08/18 was 280.2 --> today's pending     Future Options:   1. Olaparib or  talazoparib (gBRCA2 mutation)   2. Alpelisib + ET (PIK3CA mutation)   3. Clinical trial: olaparib + immunotherapy. She has a HR+ cancer which tends not to be immune activated; the trial builds upon data that PARPi can induce immune activation / infiltration that might synergize with ICI. This would require biopsies of the liver lesions as well as more visits since she will be returning per protocol.    Genomics  - consented for HARMONY trial - however original tumor destroyed, insufficient 2018 sample - may consider biopsy at next evidence PD.   - FM1 testing (liquid and bx test from 2018) showed BRCA2 mutation + PIK3CA    Dermatology. Skin damage with a plan for topical fluorouracil. Within reason, should not interfere with her breast cancer therapy.     Neuro.  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 Monitor.     Dispo.   --Cycle 4 Faslodex + abemaciclib today, will also get Xgeva  --RTC in 4 weeks for visit + labs/injections (Xgeva + Faslodex)    ------------------------------------     Interval Medical / Social History:  She is here accompanied by her husband for follow-up prior to C4 of fulvestrant + abemaciclib  Restaging scans on 12/31/18 showed stability/improvement in liver and osseous mets + decreased ascites.  Today's visit was done virtually, patient was in the clinic for the visit and was comfortable with doing the visit virtually.    -- diarrhea seems to be improving slowly, she will be good for two days and then seems to have diarrhea again. Has really had to be careful with what she eats (certain foods including vegetables make it worse). Using imodium daily, does not take more than one daily.   -- notes that some of the cramping has improved, though still has cramping with normal BM. Is using gasx which seems to be helpful.  -- walking 4-5 miles a day still, denies any notable fatigue  -- does feel a little off for a few days after the injections, feels her body is adjusting to all the medications but the this resolves.  -- she was very pleased with scan results today, and otherwise reports feeling really well.       Labs: CMP ok. CBC ok.   Tumor markers: Ca27.29 has doubled from 170 (08/26/18)--> 313.1 (09/27/18) --> 350.7 (16109) --> 419.8 (11/10/18) --> 376.1 on 12/08/18 was 280.2 --> 01/05/19 was 154.9    Staging Scans:  - 12/31/2018: CT and BS with improvement in liver lesions, bone mets, and ascites. Stable peritoneal carcinomatosis. Otherwise stable disease     Social:  - Has seen Dr. Chalmers Cater in [place] in past  - Lives mostly in [place] - sees PCP Dr. [name] [phone]  - Married 45+ years    Oncology History Overview Note   Seen at [facility]     Metastatic breast cancer (CMS-HCC)   06/1989 -  Other    Diagnosed with right breast DCIS     12/1997 Initial Diagnosis    Left BRCA-associated breast cancer - Stg II, 4 positive nodes, ER+. S/p bilateral mastectomy w/ recon     01/1998 - 05/1998 Chemotherapy    AC x 6 cycles and Taxol x 3 (stopped early d/t neuropathy). Dr. Cyndie Chime.     07/1998 - 08/1998 Radiation    Adjuvant radiation therapy      09/1998 Endocrine/Hormone Therapy    Started on Tamoxifen, completed 5 years 09/2003. Unable to tolerate AI  Other    BRCA testing - BRCA 2 mutation. Had hysterectomy and bilateral salpingo-oophorectomy in May 2017. Undergone EUS and seen by GI     11/2016 -  Other    See  in [facility] for assessment of ? GI bleed prompting imaging - found to not be blood (beets) but imaging found evidence disease recurrence     11/30/2016 Interval Scan(s)    CT c/a/p -c/w peritoneal carcinomatosis with peritoneal thickening and enhancement as well as mild ascites. Vague suspicious liver hypodensities. PET 9/11:  Several small hypermetabolic osseous metastases scattered in the thoracolumbar spine.  2. Widespread hypermetabolic peritoneal carcinomatosis throughout the peritoneal cavity as detailed. Small volume pelvic ascites.  3. Hypermetabolic left adrenal metastasis.  4. Hypermetabolic mediastinal nodal metastases.  5. Hypermetabolic inferior left axillary soft tissue mass adjacent  to surgical clips, compatible with soft tissue metastasis.  6. Small dependent right pleural effusion.     12/02/2016 Biopsy Peritoneal soft tissue biopsy - metastatic lobular carcinoma. ER 100%, PR 10%, and HER2 negative (1+)     12/10/2016 Endocrine/Hormone Therapy    Letrozole / palbociclib initiated (palbo started 01/2017). Dose reduced palbo to 100mg  for persistent grade 3 neutropenia 05/2017     12/30/2016 Genetics      Results revealed patient has the following mutation(s): Somatic tumor testing by FN[facility]:  BRCA2 positive   PIK3CA [id number]  CCND1 amplification  FGFR1 amplification  CDH1 V157fs'36  FGF19 amplification  FGF3 amplification  FGF4 amplification  MGL1 amplificiation-equivocal   NSD3 (WHSC169)amplification  NTRK1 amplificiation- equivocal  TP53 V157G-subclonal  ZNF703 amplification      03/2017 -  Research Study Participant    HARMONY - original 1999 tumor destroyed. Met inadequate / used up       10/13/2018 -  Chemotherapy    OP BREAST FULVESTRANT / abemaciclib  Fulvestrant 500 mg IM Day 1 and 15 of Cycle 1, then Day 1 of each cycle thereafter  Palbociclib 125 mg PO daily for 3 weeks on, followed by 1 week off  28-day cycle       Past Medical History:   Diagnosis Date   ??? BRCA positive     germline BRCA2   ??? Breast cancer (CMS-HCC) 1999   ??? Disease of thyroid gland     hypothyroidism   ??? DVT (deep venous thrombosis) (CMS-HCC) 1973    thought provoked by accident.    ??? Hypertension    ??? Osteoporosis     on Prolia until metastatic breast cancer diagnosis     Gyn History: menopause age 44 with chemotherapy.     Past Surgical History:   Procedure Laterality Date   ??? HYSTERECTOMY     ??? MASTECTOMY Bilateral 1999     Current Outpatient Medications   Medication Sig Dispense Refill   ??? abemaciclib (VERZENIO) 150 mg Tab tablet Take 1 tablet (150 mg total) by mouth two (2) times a day with or without food. 56 tablet 11   ??? azithromycin (AZASITE) 1 % ophthalmic solution Azasite 1 % eye drops   PALCE 1 DROP IN BOTH EYES AS DIRECTED     ??? calcium carbonate (TUMS) 200 mg calcium (500 mg) chewable tablet Tums     ??? cholecalciferol, vitamin D3, 2,000 unit cap Vitamin D3 2,000 unit capsule   Take by oral route.     ??? clobetasol (TEMOVATE) 0.05 % external solution clobetasol 0.05 % scalp solution   1 APPLICATION APPLY ON THE SKIN AT BEDTIME APPLY AT  BEDTIME TO SPOT ON SCALP FOR 2 WEEKS     ??? cyanocobalamin, vitamin B-12, (VITAMIN B-12 INJ) Inject as directed every thirty (30) days.     ??? cyclobenzaprine (FLEXERIL) 5 MG tablet Take 1 tablet (5 mg total) by mouth Three (3) times a day as needed for muscle spasms. 60 tablet 0   ??? docusate sodium (COLACE) 100 MG capsule Take 100 mg by mouth two (2) times a day as needed.     ??? ergocalciferol, vitamin D2, (VITAMIN D2 ORAL) Take 50,000 Units by mouth every seven (7) days.     ??? hydrocortisone 1 % cream Apply topically daily as needed.     ??? hyoscyamine sulfate (LEVSIN) 0.125 mg tablet Place 0.125 mg under the tongue daily as needed.     ??? ibuprofen (ADVIL,MOTRIN) 200 MG tablet Take 200 mg by mouth every six (6) hours as needed.     ??? Lactobacillus acidophilus (PROBIOTIC ORAL) Take by mouth.     ??? levothyroxine (SYNTHROID, LEVOTHROID) 75 MCG tablet Take 75 mcg by mouth daily.     ??? loperamide (IMODIUM A-D) 2 mg tablet Takes 2 tabs with first loose stool and then 1 tablet with each subsequent loose stool 30 tablet 5   ??? ondansetron (ZOFRAN) 8 MG tablet Take 1 tablet (8 mg total) by mouth every eight (8) hours as needed for nausea. 30 tablet 2   ??? pantoprazole (PROTONIX) 40 MG tablet Take 40 mg by mouth daily as needed.     ??? senna (SENOKOT) 8.6 mg tablet Take 1 tablet by mouth daily.     ??? triamcinolone (KENALOG) 0.1 % lotion Apply topically Three (3) times a day.     ??? valACYclovir (VALTREX) 500 MG tablet two (2) times a day as needed.       No current facility-administered medications for this visit.      Facility-Administered Medications Ordered in Other Visits   Medication Dose Route Frequency Provider Last Rate Last Dose   ??? denosumab (XGEVA) 120 mg/1.7 mL (70 mg/mL) injection            ??? denosumab (XGEVA) 120 mg/1.7 mL (70 mg/mL) injection            ??? denosumab (XGEVA) 120 mg/1.7 mL (70 mg/mL) injection            ??? denosumab (XGEVA) 120 mg/1.7 mL (70 mg/mL) injection              Allergies   Allergen Reactions   ??? Berry Flavor Nausea And Vomiting     All berries   ??? Heparin Analogues Hives     Hives in the past but has had heparin recently with  No problems   ??? Penicillins Other (See Comments)     Reaction unknown occurred while in college  Has patient had a PCN reaction causing immediate rash, facial/tongue/throat swelling, SOB or lightheadedness with hypotension: reaction unknown  Has patient had a PCN reaction causing severe rash involving mucus membranes or skin necrosis: reaction unknown  Has patient had a PCN reaction that required hospitalization no  Has patient had a PCN reaction occurring within the last 10 years: no  If all of the above answers are NO, then may proceed with Cep  Reaction unknown occurred while in college  Has patient had a PCN reaction causing immediate rash, facial/tongue/throat swelling, SOB or lightheadedness with hypotension: reaction unknown  Has patient had a PCN reaction causing severe rash involving mucus membranes or skin  necrosis: reaction unknown  Has patient had a PCN reaction that required hospitalization no  Has patient had a PCN reaction occurring within the last 10 years: no  If all of the above answers are NO, then may proceed with Cep   ??? Oxycodone Nausea Only     Social History     Social History Narrative    Lives in [place] several months per year with husband [name], however primary residence is [place] (21m / yr, has an excellent PCP who has offered to help manage care while down there) and they spend up to 2 months in a family home in [place], [place].      Family History   Problem Relation Age of Onset   ??? Pancreatic cancer Father 63        died in 4 months    ??? BRCA 1/2 Sister         BRCA 2+   ??? BRCA 1/2 Brother         BRCA 2+ (2 of 7 brothers are positive)    ??? BRCA 1/2 Brother         BRCA 2+     Review of Systems: A 12-system review of systems was obtained including: Constitutional, Eyes, ENT, Cardiovascular, Respiratory, GI, GU, Musculoskeletal, Skin, Neurological, Psychiatric, Endocrine, Heme/Lymphatic, and Allergic/Immunologic systems. It is negative or non-contributory to the patient???s management except as noted in the history above. ROS performed over the phone.     Physical Examination: -- No physical exam performed as this was a virtual visit, below is the prior exam from 12/08/18.  There were no vitals filed for this visit.  General:  Healthy-appearing female in no acute distress, accompanied by husband  Cardiovasc:  No heaves, regular, no additional sounds. No lower extremity edema  Respiratory:  Chest clear to percussion and auscultation, unlabored  Gastrointestinal:  Soft, nontender, no hepatomegaly. Normal bowel sounds. Nodule deep into the abdomen to the left and superior of umbilicus difficult to clearly palpate today (from prior exam: feels like might be in the abdominal musculature wall, indistinct, 3 x 3.5 cm, non tender)  Musculoskeletal:  No bony pain or tenderness  Skin and Subcutaneous Tissues: Patchy skin lesions scattered on her face, chronic   Psychiatric: Mood is normal.  No other symptoms.   Neuro:  Alert and oriented, no focal neurologic deficits  Upper Extremity Lymphedema: None  Breast/Chest: bilateral mastectomy without CW disease  Heme/Lymphatic/Immunologic: B/l axilla and supraclav ok. - unchanged exam today     I have personally reviewed the following diagnostic studies:    Breast imaging and pathology reviewed. See Results for details    ______________________________________________________________________

## 2019-01-06 LAB — CA 27-29: Lab: 154.9 — ABNORMAL HIGH

## 2019-01-10 NOTE — Unmapped (Signed)
I spoke with patient Susan Robbins to confirm appointments on the following date(s):    10.21  Benita L Hueston

## 2019-01-20 NOTE — Unmapped (Signed)
Anselm Jungling w/Dr. [name] has contacted the Communication Center requesting that the following paperwork be completed:     Type Clinic Note 01/05/19 and treatment plan  Contact Name [name]  Contact Number [phone]  Contact Fax [phone]  Date needed back by ASAP    Patient is scheduled to come back to [place] at the end of this month.    Caller has been informed that the expected turnaround is 7-10 business days.    Check Indicates criteria has been reviewed and confirmed with the patient:    [x]  Preferred Name   [x]  DOB and/or MR#  [x]  Preferred Contact Method  [x]  Phone Number(s)   []  Preferred Pharmacy (for medication refills)  []  MyChart       Thank you,   Laverna Peace  [facility]   [phone]

## 2019-01-20 NOTE — Unmapped (Signed)
RN faxed clinical note from 9/23 which also discusses treatment plan.  Faxed via [employer] to [phone].      The message you sent to [phone] at [phone], was delivered successfully on 01/20/2019 at 2:54:50 PM    JobID: [id number]

## 2019-02-02 ENCOUNTER — Other Ambulatory Visit: Admit: 2019-02-02 | Discharge: 2019-02-02 | Payer: MEDICARE

## 2019-02-02 ENCOUNTER — Telehealth: Admit: 2019-02-02 | Discharge: 2019-02-02 | Payer: MEDICARE | Attending: Adult Health | Primary: Adult Health

## 2019-02-02 ENCOUNTER — Ambulatory Visit: Admit: 2019-02-02 | Discharge: 2019-02-02 | Payer: MEDICARE

## 2019-02-02 DIAGNOSIS — C50919 Malignant neoplasm of unspecified site of unspecified female breast: Principal | ICD-10-CM

## 2019-02-02 DIAGNOSIS — C50112 Malignant neoplasm of central portion of left female breast: Principal | ICD-10-CM

## 2019-02-02 DIAGNOSIS — Z170 Estrogen receptor positive status [ER+]: Secondary | ICD-10-CM

## 2019-02-02 LAB — CBC W/ AUTO DIFF
BASOPHILS ABSOLUTE COUNT: 0.1 10*9/L (ref 0.0–0.1)
BASOPHILS RELATIVE PERCENT: 2.2 %
EOSINOPHILS ABSOLUTE COUNT: 0.1 10*9/L (ref 0.0–0.4)
EOSINOPHILS RELATIVE PERCENT: 3.3 %
HEMATOCRIT: 34.7 % — ABNORMAL LOW (ref 36.0–46.0)
HEMOGLOBIN: 11.8 g/dL — ABNORMAL LOW (ref 12.0–16.0)
LARGE UNSTAINED CELLS: 4 % (ref 0–4)
LYMPHOCYTES ABSOLUTE COUNT: 0.8 10*9/L — ABNORMAL LOW (ref 1.5–5.0)
LYMPHOCYTES RELATIVE PERCENT: 21.9 %
MEAN CORPUSCULAR HEMOGLOBIN CONC: 34.1 g/dL (ref 31.0–37.0)
MEAN CORPUSCULAR HEMOGLOBIN: 33.4 pg (ref 26.0–34.0)
MONOCYTES ABSOLUTE COUNT: 0.2 10*9/L (ref 0.2–0.8)
MONOCYTES RELATIVE PERCENT: 6.1 %
NEUTROPHILS ABSOLUTE COUNT: 2.1 10*9/L (ref 2.0–7.5)
NEUTROPHILS RELATIVE PERCENT: 62.6 %
PLATELET COUNT: 207 10*9/L (ref 150–440)
RED BLOOD CELL COUNT: 3.55 10*12/L — ABNORMAL LOW (ref 4.00–5.20)
RED CELL DISTRIBUTION WIDTH: 16.8 % — ABNORMAL HIGH (ref 12.0–15.0)
WBC ADJUSTED: 3.4 10*9/L — ABNORMAL LOW (ref 4.5–11.0)

## 2019-02-02 LAB — COMPREHENSIVE METABOLIC PANEL
ALBUMIN: 4.1 g/dL (ref 3.5–5.0)
ALKALINE PHOSPHATASE: 46 U/L (ref 38–126)
ALT (SGPT): 15 U/L (ref <35)
ANION GAP: 4 mmol/L — ABNORMAL LOW (ref 7–15)
AST (SGOT): 26 U/L (ref 14–38)
BILIRUBIN TOTAL: 0.7 mg/dL (ref 0.0–1.2)
BLOOD UREA NITROGEN: 14 mg/dL (ref 7–21)
BUN / CREAT RATIO: 16
CALCIUM: 9.7 mg/dL (ref 8.5–10.2)
CHLORIDE: 100 mmol/L (ref 98–107)
CO2: 31 mmol/L — ABNORMAL HIGH (ref 22.0–30.0)
CREATININE: 0.9 mg/dL (ref 0.60–1.00)
EGFR CKD-EPI AA FEMALE: 75 mL/min/{1.73_m2} (ref >=60)
GLUCOSE RANDOM: 85 mg/dL (ref 70–179)
POTASSIUM: 4.4 mmol/L (ref 3.5–5.0)
PROTEIN TOTAL: 7.2 g/dL (ref 6.5–8.3)
SODIUM: 135 mmol/L (ref 135–145)

## 2019-02-02 LAB — URINALYSIS WITH CULTURE REFLEX
BACTERIA: NONE SEEN /HPF
BILIRUBIN UA: NEGATIVE
GLUCOSE UA: NEGATIVE
KETONES UA: NEGATIVE
LEUKOCYTE ESTERASE UA: NEGATIVE
PH UA: 5 (ref 5.0–9.0)
PROTEIN UA: NEGATIVE
RBC UA: <1 /HPF (ref <=4)
SPECIFIC GRAVITY UA: 1.011 (ref 1.003–1.030)
SQUAMOUS EPITHELIAL: <1 /HPF (ref 0–5)
UROBILINOGEN UA: 0.2
WBC UA: <1 /HPF (ref 0–5)

## 2019-02-02 LAB — PHOSPHORUS: Phosphate:MCnc:Pt:Ser/Plas:Qn:: 4.2

## 2019-02-02 LAB — BACTERIA: Bacteria:PrThr:Pt:Urine:Ord:: NONE SEEN

## 2019-02-02 LAB — LYMPHOCYTES ABSOLUTE COUNT: Lymphocytes:NCnc:Pt:Bld:Qn:Automated count: 0.8 — ABNORMAL LOW

## 2019-02-02 LAB — CO2: Carbon dioxide:SCnc:Pt:Ser/Plas:Qn:: 31 — ABNORMAL HIGH

## 2019-02-02 NOTE — Unmapped (Signed)
Labs drawn via phlebotomy stick by Waymon Budge and sent for analysis.

## 2019-02-02 NOTE — Unmapped (Signed)
I spoke with patient Susan Robbins to confirm appointments on the following date(s): 12.11 and 12.16     Benita L Hueston

## 2019-02-02 NOTE — Unmapped (Signed)
[facility] - Arts administrator (in lieu of in-person visit during COVID-19 pandemic).     Visit Conducted via Video  Person Contacted: Patient   Contact Phone number: [phone]  Phone Outcome: Contacted Patient/Caregiver  Is there someone else in the room? YES (husband)    I spent 21 minutes on the real-time audio and video with the patient. I spent an additional 5 minutes on pre- and post-visit activities.     The patient was physically located in [place] or a state in which I am permitted to provide care. The patient and/or parent/guardian understood that s/he may incur co-pays and cost sharing, and agreed to the telemedicine visit. The visit was reasonable and appropriate under the circumstances given the patient's presentation at the time.    The patient and/or parent/guardian has been advised of the potential risks and limitations of this mode of treatment (including, but not limited to, the absence of in-person examination) and has agreed to be treated using telemedicine. The patient's/patient's family's questions regarding telemedicine have been answered.     If the visit was completed in an ambulatory setting, the patient and/or parent/guardian has also been advised to contact their provider???s office for worsening conditions, and seek emergency medical treatment and/or call 911 if the patient deems either necessary.       Patient location during call:  [facility]    Reason for Visit: Management of breast cancer.     ---------------------------------------------------------------------------------------------------------------------------------------------------------------------    Breast Cancer Return Patient Evaluation    PCP: Donivan Scull, MD   PCP [facility]): Dr. [name] [phone] (fax # [phone])  [facility]: Chalmers Cater, MD ([facility], [place]) - send notes Reason for Visit: 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  See OncHistory for details. Letrozole initiated 12/2016, Ibrance initiated 01/29/17. DR Ilda Foil to 100mg  05/2017 secondary to neutropenia. Continued on letrozole only d/t concern for MS[facility] side effects from [place].     Patient was living in [facility] given COVID-19 pandemic but has recently moved back to [facility] for the summer and will receive care here. She stopped the letrozole on 5/22 due to rising Ca27.29 levels in [place]. Restaging CT c/a/p from 09/27/18 showed overt progression in the liver and peritoneum and most recent Ca27-29 however has now more than doubled to 313.1. Started on fulvestrant + abemaciclib.     Tolerating abemaciclib well now with slight improvement in abdominal cramping and diarrhea. Taking imodium once daily on average but continuing to be very cautious with diet and to avoid high fiber foods.    Systemic Therapy:  -- Here today for cycle 5 fulvestrant + abemaciclib, fulvestrant given today  -- Continue Xgeva monthly, given today  -- Restaging scan CT/BS 12/31/18: overall stable with decrease in some osseous lesions and in liver mets, also decreased ascites. Stable peritoneal carcinomatosis.  Repeat December 2020.  -- Has inquired about receiving faslodex in [place] when returns to [place] for the winter.  Her PCP helped her establish with an oncologist there to get Faslodex injections in November 2020.  She will continue to return for scan visits, next due December 2020.    Tumor Markers:  -- CA 27-29: on 11/24/18 was 376.1 --> 12/08/18 was 280.2 --> 9/23 was 154.9 --> 10/21 pending    Future Options:   1. Olaparib or talazoparib (gBRCA2 mutation)  2. Alpelisib + ET (PIK3CA mutation) 3. Clinical trial: olaparib + immunotherapy. She has a HR+ cancer which tends not to be immune activated; the trial builds upon data that PARPi can induce immune activation / infiltration that might synergize with ICI. This would require biopsies of the liver lesions as well as more visits since she will be returning per protocol.    Genomics  - consented for HARMONY trial - however original tumor destroyed, insufficient 2018 sample - may consider biopsy at next evidence PD.   - FM1 testing (liquid and bx test from 2018) showed BRCA2 mutation + PIK3CA    Dermatology. Skin damage with a plan for topical fluorouracil. Within reason, should not interfere with her breast cancer therapy.     Neuro.  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 Monitor.     Dispo.   --Cycle 5 Faslodex + abemaciclib today, will also get Xgeva  --In 4 weeks she will get cycle 6 of Faslodex plus labs with oncologist in [place], her PCP helped her to set this up.  --RTC in 8 weeks with repeat scans + labs/injection + Dr. Iona Hansen visit     Addendum 02/02/2019: After the visit UA results were negative for concerns for UTI.  My chart message was sent to the patient, she will continue to monitor urinary urgency and frequency.  ------------------------------------     Interval Medical / Social History:  She is here accompanied by her husband for follow-up prior to C5 of fulvestrant + abemaciclib  Today's visit was done virtually, patient was in the clinic for the visit and was comfortable with doing the visit virtually.    -- still with diarrhea. She finds that pizza is the best thing she tolerates in terms of GI side effects. She does miss her normal diet, including vegetables and salads that usually give her diarrhea. Overall bowels are more loose. Using imodium, though not using for every episode only if having more than one episode in a day.   -- protonix still helpful for indegestion/reflux -- abdominal cramping is better, less often. Still getting intermittently. She does notice that cramping occurs cyclical at 2-3 in the afternoon.   -- urinary frequency, she is waking up early in the morning now to pee. Also notes urinary urgency.  We will check a UA today.  -- she reports her energy is great! Still walking 4 miles a day.   -- does notice that she feels pressure near her naval at times after large BM, but this is not consistent   --ROS otherwise reviewed and is negative/normal      Labs: CMP ok. CBC ok.   Tumor markers: Ca27.29 has doubled from 170 (08/26/18)--> 313.1 (09/27/18) --> 350.7 (16109) --> 419.8 (11/10/18) --> 376.1 on 12/08/18 was 280.2 --> 01/05/19 was 154.9 --> 10/21 pending    Staging Scans:  - 12/31/2018: CT and BS with improvement in liver lesions, bone mets, and ascites. Stable peritoneal carcinomatosis. Otherwise stable disease     Social:  - Has seen Dr. Chalmers Cater in [place] in past  - Lives mostly in [place] - sees PCP Dr. [name] - [phone]  - Married 45+ years    Oncology History Overview Note   Seen at [facility]     Metastatic breast cancer (CMS-HCC)   06/1989 -  Other    Diagnosed with right breast DCIS     12/1997 Initial Diagnosis    Left BRCA-associated breast cancer - Stg II, 4 positive nodes,  ER+. S/p bilateral mastectomy w/ recon     01/1998 - 05/1998 Chemotherapy    AC x 6 cycles and Taxol x 3 (stopped early d/t neuropathy). Dr. Cyndie Chime.     07/1998 - 08/1998 Radiation    Adjuvant radiation therapy      09/1998 Endocrine/Hormone Therapy    Started on Tamoxifen, completed 5 years 09/2003. Unable to tolerate AI      Other    BRCA testing - BRCA 2 mutation. Had hysterectomy and bilateral salpingo-oophorectomy in May 2017. Undergone EUS and seen by GI     11/2016 -  Other    See  in [facility] for assessment of ? GI bleed prompting imaging - found to not be blood (beets) but imaging found evidence disease recurrence     11/30/2016 Interval Scan(s) CT c/a/p -c/w peritoneal carcinomatosis with peritoneal thickening and enhancement as well as mild ascites. Vague suspicious liver hypodensities. PET 9/11:  Several small hypermetabolic osseous metastases scattered in the thoracolumbar spine.  2. Widespread hypermetabolic peritoneal carcinomatosis throughout the peritoneal cavity as detailed. Small volume pelvic ascites.  3. Hypermetabolic left adrenal metastasis.  4. Hypermetabolic mediastinal nodal metastases.  5. Hypermetabolic inferior left axillary soft tissue mass adjacent  to surgical clips, compatible with soft tissue metastasis.  6. Small dependent right pleural effusion.     12/02/2016 Biopsy    Peritoneal soft tissue biopsy - metastatic lobular carcinoma. ER 100%, PR 10%, and HER2 negative (1+)     12/10/2016 Endocrine/Hormone Therapy    Letrozole / palbociclib initiated (palbo started 01/2017). Dose reduced palbo to 100mg  for persistent grade 3 neutropenia 05/2017     12/30/2016 Genetics      Results revealed patient has the following mutation(s): Somatic tumor testing by [facility]:  BRCA2 positive   PIK3CA [id number]  CCND1 amplification  FGFR1 amplification  CDH1 V130fs'36  FGF19 amplification  FGF3 amplification  FGF4 amplification  MGL1 amplificiation-equivocal   NSD3 (WHSC169)amplification  NTRK1 amplificiation- equivocal  TP53 V157G-subclonal  [id number] amplification      03/2017 -  Research Study Participant    HARMONY - original 1999 tumor destroyed. Met inadequate / used up       10/13/2018 -  Chemotherapy    OP BREAST FULVESTRANT / abemaciclib  Fulvestrant 500 mg IM Day 1 and 15 of Cycle 1, then Day 1 of each cycle thereafter  Palbociclib 125 mg PO daily for 3 weeks on, followed by 1 week off  28-day cycle       Past Medical History:   Diagnosis Date   ??? BRCA positive     germline BRCA2   ??? Breast cancer (CMS-HCC) 1999   ??? Disease of thyroid gland     hypothyroidism   ??? DVT (deep venous thrombosis) (CMS-HCC) 1973    thought provoked by accident. ??? Hypertension    ??? Osteoporosis     on Prolia until metastatic breast cancer diagnosis     Gyn History: menopause age 66 with chemotherapy.     Past Surgical History:   Procedure Laterality Date   ??? HYSTERECTOMY     ??? MASTECTOMY Bilateral 1999     Current Outpatient Medications   Medication Sig Dispense Refill   ??? abemaciclib (VERZENIO) 150 mg Tab tablet Take 1 tablet (150 mg total) by mouth two (2) times a day with or without food. 56 tablet 11   ??? azithromycin (AZASITE) 1 % ophthalmic solution Azasite 1 % eye drops   PALCE 1 DROP IN BOTH  EYES AS DIRECTED     ??? calcium carbonate (TUMS) 200 mg calcium (500 mg) chewable tablet Tums     ??? cholecalciferol, vitamin D3, 2,000 unit cap Vitamin D3 2,000 unit capsule   Take by oral route.     ??? clobetasol (TEMOVATE) 0.05 % external solution clobetasol 0.05 % scalp solution   1 APPLICATION APPLY ON THE SKIN AT BEDTIME APPLY AT BEDTIME TO SPOT ON SCALP FOR 2 WEEKS     ??? cyanocobalamin, vitamin B-12, (VITAMIN B-12 INJ) Inject as directed every thirty (30) days.     ??? cyclobenzaprine (FLEXERIL) 5 MG tablet Take 1 tablet (5 mg total) by mouth Three (3) times a day as needed for muscle spasms. 60 tablet 0   ??? docusate sodium (COLACE) 100 MG capsule Take 100 mg by mouth two (2) times a day as needed.     ??? ergocalciferol, vitamin D2, (VITAMIN D2 ORAL) Take 50,000 Units by mouth every seven (7) days.     ??? hydrocortisone 1 % cream Apply topically daily as needed.     ??? hyoscyamine sulfate (LEVSIN) 0.125 mg tablet Place 0.125 mg under the tongue daily as needed.     ??? ibuprofen (ADVIL,MOTRIN) 200 MG tablet Take 200 mg by mouth every six (6) hours as needed.     ??? Lactobacillus acidophilus (PROBIOTIC ORAL) Take by mouth.     ??? levothyroxine (SYNTHROID, LEVOTHROID) 75 MCG tablet Take 75 mcg by mouth daily.     ??? loperamide (IMODIUM A-D) 2 mg tablet Takes 2 tabs with first loose stool and then 1 tablet with each subsequent loose stool 30 tablet 5 ??? ondansetron (ZOFRAN) 8 MG tablet Take 1 tablet (8 mg total) by mouth every eight (8) hours as needed for nausea. 30 tablet 2   ??? pantoprazole (PROTONIX) 40 MG tablet Take 40 mg by mouth daily as needed.     ??? senna (SENOKOT) 8.6 mg tablet Take 1 tablet by mouth daily.     ??? triamcinolone (KENALOG) 0.1 % lotion Apply topically Three (3) times a day.     ??? valACYclovir (VALTREX) 500 MG tablet two (2) times a day as needed.       Current Facility-Administered Medications   Medication Dose Route Frequency Provider Last Rate Last Dose   ??? fulvestrant (FASLODEX) injection 500 mg  500 mg Intramuscular Once Joylene John, MD         Facility-Administered Medications Ordered in Other Visits   Medication Dose Route Frequency Provider Last Rate Last Dose   ??? denosumab (XGEVA) 120 mg/1.7 mL (70 mg/mL) injection            ??? denosumab (XGEVA) 120 mg/1.7 mL (70 mg/mL) injection            ??? denosumab (XGEVA) 120 mg/1.7 mL (70 mg/mL) injection              Allergies   Allergen Reactions   ??? Berry Flavor Nausea And Vomiting     All berries   ??? Heparin Analogues Hives     Hives in the past but has had heparin recently with  No problems   ??? Penicillins Other (See Comments)     Reaction unknown occurred while in college  Has patient had a PCN reaction causing immediate rash, facial/tongue/throat swelling, SOB or lightheadedness with hypotension: reaction unknown  Has patient had a PCN reaction causing severe rash involving mucus membranes or skin necrosis: reaction unknown  Has patient had a PCN  reaction that required hospitalization no  Has patient had a PCN reaction occurring within the last 10 years: no  If all of the above answers are NO, then may proceed with Cep  Reaction unknown occurred while in college  Has patient had a PCN reaction causing immediate rash, facial/tongue/throat swelling, SOB or lightheadedness with hypotension: reaction unknown Has patient had a PCN reaction causing severe rash involving mucus membranes or skin necrosis: reaction unknown  Has patient had a PCN reaction that required hospitalization no  Has patient had a PCN reaction occurring within the last 10 years: no  If all of the above answers are NO, then may proceed with Cep   ??? Oxycodone Nausea Only     Social History     Social History Narrative    Lives in [place] several months per year with husband [name], however primary residence is [place] (52m / yr, has an excellent PCP who has offered to help manage care while down there) and they spend up to 2 months in a family home in [place], [place].      Family History   Problem Relation Age of Onset   ??? Pancreatic cancer Father 82        died in 4 months    ??? BRCA 1/2 Sister         BRCA 2+   ??? BRCA 1/2 Brother         BRCA 2+ (2 of 7 brothers are positive)    ??? BRCA 1/2 Brother         BRCA 2+     Review of Systems: A 12-system review of systems was obtained including: Constitutional, Eyes, ENT, Cardiovascular, Respiratory, GI, GU, Musculoskeletal, Skin, Neurological, Psychiatric, Endocrine, Heme/Lymphatic, and Allergic/Immunologic systems. It is negative or non-contributory to the patient???s management except as noted in the history above. ROS performed over the phone.     Physical Examination: -- No physical exam performed as this was a virtual visit, below is the prior exam from 12/08/18.  There were no vitals filed for this visit.  General:  Healthy-appearing female in no acute distress, accompanied by husband  Cardiovasc:  No heaves, regular, no additional sounds. No lower extremity edema  Respiratory:  Chest clear to percussion and auscultation, unlabored Gastrointestinal:  Soft, nontender, no hepatomegaly. Normal bowel sounds. Nodule deep into the abdomen to the left and superior of umbilicus difficult to clearly palpate today (from prior exam: feels like might be in the abdominal musculature wall, indistinct, 3 x 3.5 cm, non tender)  Musculoskeletal:  No bony pain or tenderness  Skin and Subcutaneous Tissues: Patchy skin lesions scattered on her face, chronic   Psychiatric: Mood is normal.  No other symptoms.   Neuro:  Alert and oriented, no focal neurologic deficits  Upper Extremity Lymphedema: None  Breast/Chest: bilateral mastectomy without CW disease  Heme/Lymphatic/Immunologic: B/l axilla and supraclav ok. - unchanged exam today     I have personally reviewed the following diagnostic studies:    Breast imaging and pathology reviewed. See Results for details    ______________________________________________________________________

## 2019-02-02 NOTE — Unmapped (Signed)
It was a pleasure to see you today in the Medical Oncology Clinic.    Please call our nurse navigator, Modena Jansky, if you have any interval questions or concerns:      Modena Jansky, RN: phone: [phone]     Prescription refills: [phone]     FAX: [phone]   ----------------------------------------------------------------------------------------------------  For health related questions call:   Please call NURSE TRIAGE LINE at [phone]    For appointments & questions Monday through Friday 8 AM???5 PM     Please call [phone] or Toll free [phone]    On Lovenia Kim and Holidays  Call [phone] and ask for the oncologist on call    For emergencies, evenings or weekends, please call [phone] and ask for the oncology fellow on call.     Reasons to call emergency line may include:   Fever of 100.5 or greater   Nausea and/or vomiting not relieved with nausea medicine   Diarrhea or constipation not relieved with bowel regimen   Severe pain not relieved with usual pain regimen     --------------------------------------------------------------------------------------------------------  Given the rapidly spreading coronavirus (COVID-19), we recommended you avoid travel and crowds, and wash your hands frequently.     If you develop fever, cough, shortness of breath and/or have known exposure (close contact < 6 feet) with someone who has tested positive please call the [facility] Coronavirus Helpline at [phone].         Carlos American, DNP, NP-C  Breast Medical Oncology  [facility] Hematology/Oncology  [address]  [place], [place] [address]

## 2019-02-03 LAB — CA 27-29: Lab: 114.5 — ABNORMAL HIGH

## 2019-02-10 DIAGNOSIS — Z9010 Acquired absence of unspecified breast and nipple: Principal | ICD-10-CM

## 2019-02-10 DIAGNOSIS — C50919 Malignant neoplasm of unspecified site of unspecified female breast: Principal | ICD-10-CM

## 2019-03-02 MED ORDER — CYCLOBENZAPRINE 5 MG TABLET
ORAL_TABLET | Freq: Three times a day (TID) | ORAL | 0 refills | 20.00000 days | Status: CP | PRN
Start: 2019-03-02 — End: ?

## 2019-03-25 ENCOUNTER — Ambulatory Visit: Admit: 2019-03-25 | Discharge: 2019-04-05 | Payer: MEDICARE

## 2019-03-25 ENCOUNTER — Other Ambulatory Visit: Admit: 2019-03-25 | Discharge: 2019-04-05 | Payer: MEDICARE

## 2019-03-25 DIAGNOSIS — C50919 Malignant neoplasm of unspecified site of unspecified female breast: Principal | ICD-10-CM

## 2019-03-25 LAB — CBC W/ AUTO DIFF
BASOPHILS ABSOLUTE COUNT: 0.1 10*9/L (ref 0.0–0.1)
BASOPHILS RELATIVE PERCENT: 1.8 %
EOSINOPHILS ABSOLUTE COUNT: 0.2 10*9/L (ref 0.0–0.4)
EOSINOPHILS RELATIVE PERCENT: 4.7 %
HEMATOCRIT: 37.3 % (ref 36.0–46.0)
HEMOGLOBIN: 12.7 g/dL (ref 12.0–16.0)
LARGE UNSTAINED CELLS: 3 % (ref 0–4)
LYMPHOCYTES RELATIVE PERCENT: 31.9 %
MEAN CORPUSCULAR HEMOGLOBIN CONC: 34.1 g/dL (ref 31.0–37.0)
MEAN CORPUSCULAR VOLUME: 99.5 fL (ref 80.0–100.0)
MEAN PLATELET VOLUME: 7.5 fL (ref 7.0–10.0)
MONOCYTES ABSOLUTE COUNT: 0.2 10*9/L (ref 0.2–0.8)
MONOCYTES RELATIVE PERCENT: 6.1 %
NEUTROPHILS ABSOLUTE COUNT: 1.8 10*9/L — ABNORMAL LOW (ref 2.0–7.5)
NEUTROPHILS RELATIVE PERCENT: 52.7 %
PLATELET COUNT: 193 10*9/L (ref 150–440)
RED BLOOD CELL COUNT: 3.75 10*12/L — ABNORMAL LOW (ref 4.00–5.20)
RED CELL DISTRIBUTION WIDTH: 14.1 % (ref 12.0–15.0)
WBC ADJUSTED: 3.3 10*9/L — ABNORMAL LOW (ref 4.5–11.0)

## 2019-03-25 LAB — COMPREHENSIVE METABOLIC PANEL
ALBUMIN: 4.1 g/dL (ref 3.5–5.0)
ALKALINE PHOSPHATASE: 41 U/L (ref 38–126)
ALT (SGPT): 16 U/L (ref <35)
ANION GAP: 7 mmol/L (ref 7–15)
AST (SGOT): 29 U/L (ref 14–38)
BILIRUBIN TOTAL: 0.7 mg/dL (ref 0.0–1.2)
BLOOD UREA NITROGEN: 20 mg/dL (ref 7–21)
BUN / CREAT RATIO: 23
CALCIUM: 9.6 mg/dL (ref 8.5–10.2)
CO2: 30 mmol/L (ref 22.0–30.0)
CREATININE: 0.86 mg/dL (ref 0.60–1.00)
EGFR CKD-EPI AA FEMALE: 79 mL/min/{1.73_m2} (ref >=60)
EGFR CKD-EPI NON-AA FEMALE: 69 mL/min/{1.73_m2} (ref >=60)
GLUCOSE RANDOM: 82 mg/dL (ref 70–179)
POTASSIUM: 4.7 mmol/L (ref 3.5–5.0)
PROTEIN TOTAL: 7.6 g/dL (ref 6.5–8.3)
SODIUM: 137 mmol/L (ref 135–145)

## 2019-03-25 LAB — MONOCYTES RELATIVE PERCENT: Monocytes/100 leukocytes:NFr:Pt:Bld:Qn:Automated count: 6.1

## 2019-03-25 LAB — EGFR CKD-EPI NON-AA FEMALE: Lab: 69

## 2019-03-25 MED ADMIN — Technetium Tc-99m Oxidronate HDP: 21.7 | INTRAVENOUS | @ 16:00:00 | Stop: 2019-03-25

## 2019-03-25 MED ADMIN — iohexoL (OMNIPAQUE) 350 mg iodine/mL solution 100 mL: 100 mL | INTRAVENOUS | @ 17:00:00 | Stop: 2019-03-25

## 2019-03-28 LAB — CA 27-29: Lab: 91.4 — ABNORMAL HIGH

## 2019-03-29 NOTE — Unmapped (Signed)
Left message about the appt changing to a video visit on 12.16

## 2019-03-30 ENCOUNTER — Telehealth
Admit: 2019-03-30 | Discharge: 2019-03-30 | Payer: MEDICARE | Attending: Hematology & Oncology | Primary: Hematology & Oncology

## 2019-03-30 ENCOUNTER — Institutional Professional Consult (permissible substitution): Admit: 2019-03-30 | Discharge: 2019-03-30 | Payer: MEDICARE

## 2019-03-30 DIAGNOSIS — C50919 Malignant neoplasm of unspecified site of unspecified female breast: Principal | ICD-10-CM

## 2019-03-30 MED ADMIN — denosumab (XGEVA) 120 mg/1.7 mL (70 mg/mL) injection: SUBCUTANEOUS | @ 17:00:00 | Stop: 2019-03-30

## 2019-03-30 MED ADMIN — fulvestrant (FASLODEX) injection 500 mg: 500 mg | INTRAMUSCULAR | @ 17:00:00 | Stop: 2019-03-30

## 2019-03-30 MED ADMIN — denosumab (XGEVA) injection 120 mg: 120 mg | SUBCUTANEOUS | @ 17:00:00 | Stop: 2019-03-30

## 2019-03-30 NOTE — Unmapped (Signed)
Virtual screening done. Provider on campus.

## 2019-03-30 NOTE — Unmapped (Signed)
BREAST MEDICAL ONCOLOGY - Video-Assisted Outreach Encounter (in lieu of in-person visit during COVID-19 pandemic).      Patient location during call: [place], [place]     Reason for Visit: f/u of metastatic breast cancer   ___________________________________________________________________  I spent 25 minutes on the real-time audio and video with the patient. I spent an additional 10 minutes on pre- and post-visit activities.     The patient was physically located in [place] or a state in which I am permitted to provide care. The patient and/or parent/guardian understood that s/he may incur co-pays and cost sharing, and agreed to the telemedicine visit. The visit was reasonable and appropriate under the circumstances given the patient's presentation at the time.    The patient and/or parent/guardian has been advised of the potential risks and limitations of this mode of treatment (including, but not limited to, the absence of in-person examination) and has agreed to be treated using telemedicine. The patient's/patient's family's questions regarding telemedicine have been answered.     If the visit was completed in an ambulatory setting, the patient and/or parent/guardian has also been advised to contact their provider???s office for worsening conditions, and seek emergency medical treatment and/or call 911 if the patient deems either necessary.    Breast Cancer Return Patient Evaluation    PCP: Donivan Scull, MD   PCP ([place]): Dr. [name] [phone] (fax # [phone])  [facility]: Chalmers Cater, MD ([facility], [place]) - send notes     Reason for Visit: 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  See OncHistory for details. Letrozole initiated 12/2016, Ibrance initiated 01/29/17. DR Ilda Foil to 100mg  05/2017 secondary to neutropenia. Continued on letrozole only d/t concern for MSK side effects from [place].     Patient was living in [facility] given COVID-19 pandemic but has recently moved back to [facility] for the summer and will receive care here. She stopped the letrozole on 5/22 due to rising Ca27.29 levels in [place]. Restaging CT c/a/p from 09/27/18 showed overt progression in the liver and peritoneum. Started on fulvestrant + abemaciclib.     Tolerating abemaciclib well now with stable abdominal cramping and diarrhea. Taking imodium once daily on average but continuing to be very cautious with diet and to avoid high fiber foods. Most recent scans are stable with stable Ca 27.29.     Systemic Therapy:  -- Here today for cycle 7 fulvestrant + abemaciclib, fulvestrant given today  -- Continue Xgeva monthly, given today  -- Restaging scan CT/BS 03/25/19: overall stable with decrease in some osseous lesions and in liver mets, also decreased ascites. Stable peritoneal carcinomatosis.  Repeat March 2020 in [place].  -- She has established care with an oncologist in [place], [place] where her and her husband will reside until ~May 2021. She notes that her oncologist there is comfortable giving fulvestrant + Xgeva and she is going to get labs/scans there as well (she will send [place] his contact information so we can stay in touch about this information). She knows that she should reach out sooner if progression noted on her scans.     Future Options:   1. Olaparib or talazoparib (gBRCA2 mutation)   2. Alpelisib + ET (PIK3CA mutation) 3. Clinical trial: olaparib + immunotherapy. She has a HR+ cancer which tends not to be immune activated; the trial builds upon data that PARPi can induce immune  activation / infiltration that might synergize with ICI. This would require biopsies of the liver lesions as well as more visits since she will be returning per protocol.    Genomics  - consented for HARMONY trial - however original tumor destroyed, insufficient 2018 sample - may consider biopsy at next evidence PD.   - FM1 testing (liquid and bx test from 2018) showed BRCA2 mutation + PIK3CA    Dermatology. Skin damage with a plan for topical fluorouracil. Within reason, should not interfere with her breast cancer therapy.     Neuro.  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 Monitor.     Dispo.   --Cycle 7 Faslodex + abemaciclib today, will also get Xgeva  --In 4 weeks she will get cycle 8 of Faslodex plus labs with oncologist in [place], her PCP helped her to set this up.  --RTC in six months for visit with Dr. Georga Hacking upon their return from [place]     Seen and discussed with attending Dr. Iona Hansen.    Susan Royals Rozell Searing, MD  PGY-4 - Hematology/Oncology  ------------------------------------   Interval Medical / Social History:  She is accompanied by her husband for follow-up prior to C7 of fulvestrant + abemaciclib. Today's visit was done virtually.     -- Mid November she notes worsening diarrhea to the point that she felt like she had to come off the medication. Since December first, notes that she is doing well and diarrhea has stabilized again. Using approximately one Imodium daily. Still watching what she eats and interestingly pizza is one food that does not exacerbate her diarrhea. She does miss normal diet of veggies and salads that cause diarrhea  --Still having some abdominal cramping but largely stable.   -- Last got Faslodex today in clinic as well as Xgeva.   -- energy is excellent; she is still walking at least four miles per day. --ROS otherwise reviewed and is negative/normal    Labs: CMP ok. CBC ok.   Tumor markers: Ca27.29 170 (08/26/18)--> 313.1 (09/27/18) --> 350.7 (42595) --> 419.8 (11/10/18) --> 376.1 on 12/08/18 was 280.2 --> 01/05/19 was 154.9 --> 10/21 114.5 --> 03/25/19 91.4    Staging Scans:  - 03/25/2019: CT and BS with stable R lateral fourth rib which corresponds to a sclerotic lesion on CT. Interval decrease in size of multiple hepatic lesions, stable to less prominent peritoneal carcinomatosis.      Social:  - Has seen Dr. Chalmers Cater in [place] in past  - Lives mostly in [place] - sees PCP Dr. [name] [phone]  - Married 45+ years    Oncology History Overview Note   Seen at [facility]     Metastatic breast cancer (CMS-HCC)   06/1989 -  Other    Diagnosed with right breast DCIS     12/1997 Initial Diagnosis    Left BRCA-associated breast cancer - Stg II, 4 positive nodes, ER+. S/p bilateral mastectomy w/ recon     01/1998 - 05/1998 Chemotherapy    AC x 6 cycles and Taxol x 3 (stopped early d/t neuropathy). Dr. Cyndie Chime.     07/1998 - 08/1998 Radiation    Adjuvant radiation therapy      09/1998 Endocrine/Hormone Therapy    Started on Tamoxifen, completed 5 years 09/2003. Unable to tolerate AI      Other    BRCA testing - BRCA 2 mutation. Had hysterectomy and bilateral salpingo-oophorectomy in May 2017. Undergone EUS and seen by GI  11/2016 -  Other    See  in [facility] ED for assessment of ? GI bleed prompting imaging - found to not be blood (beets) but imaging found evidence disease recurrence     11/30/2016 Interval Scan(s)    CT c/a/p -c/w peritoneal carcinomatosis with peritoneal thickening and enhancement as well as mild ascites. Vague suspicious liver hypodensities. PET 9/11:  Several small hypermetabolic osseous metastases scattered in the thoracolumbar spine.  2. Widespread hypermetabolic peritoneal carcinomatosis throughout the peritoneal cavity as detailed. Small volume pelvic ascites. 3. Hypermetabolic left adrenal metastasis.  4. Hypermetabolic mediastinal nodal metastases.  5. Hypermetabolic inferior left axillary soft tissue mass adjacent  to surgical clips, compatible with soft tissue metastasis.  6. Small dependent right pleural effusion.     12/02/2016 Biopsy    Peritoneal soft tissue biopsy - metastatic lobular carcinoma. ER 100%, PR 10%, and HER2 negative (1+)     12/10/2016 Endocrine/Hormone Therapy    Letrozole / palbociclib initiated (palbo started 01/2017). Dose reduced palbo to 100mg  for persistent grade 3 neutropenia 05/2017     12/30/2016 Genetics      Results revealed patient has the following mutation(s): Somatic tumor testing by FN[facility]:  BRCA2 positive   PIK3CA [id number]  CCND1 amplification  FGFR1 amplification  CDH1 V134fs'36  FGF19 amplification  FGF3 amplification  FGF4 amplification  MGL1 amplificiation-equivocal   NSD3 (WHSC169)amplification  NTRK1 amplificiation- equivocal  TP53 V157G-subclonal  ZNF703 amplification      03/2017 -  Research Study Participant    HARMONY - original 1999 tumor destroyed. Met inadequate / used up       10/13/2018 -  Chemotherapy    OP BREAST FULVESTRANT / abemaciclib  Fulvestrant 500 mg IM Day 1 and 15 of Cycle 1, then Day 1 of each cycle thereafter  Palbociclib 125 mg PO daily for 3 weeks on, followed by 1 week off  28-day cycle       Past Medical History:   Diagnosis Date   ??? BRCA positive     germline BRCA2   ??? Breast cancer (CMS-HCC) 1999   ??? Disease of thyroid gland     hypothyroidism   ??? DVT (deep venous thrombosis) (CMS-HCC) 1973    thought provoked by accident.    ??? Hypertension    ??? Osteoporosis     on Prolia until metastatic breast cancer diagnosis     Gyn History: menopause age 56 with chemotherapy.     Past Surgical History:   Procedure Laterality Date   ??? HYSTERECTOMY     ??? MASTECTOMY Bilateral 1999     Current Outpatient Medications   Medication Sig Dispense Refill ??? abemaciclib (VERZENIO) 150 mg Tab tablet Take 1 tablet (150 mg total) by mouth two (2) times a day with or without food. 56 tablet 11   ??? azithromycin (AZASITE) 1 % ophthalmic solution Azasite 1 % eye drops   PALCE 1 DROP IN BOTH EYES AS DIRECTED     ??? calcium carbonate (TUMS) 200 mg calcium (500 mg) chewable tablet Tums     ??? cholecalciferol, vitamin D3, 2,000 unit cap Vitamin D3 2,000 unit capsule   Take by oral route.     ??? clobetasol (TEMOVATE) 0.05 % external solution clobetasol 0.05 % scalp solution   1 APPLICATION APPLY ON THE SKIN AT BEDTIME APPLY AT BEDTIME TO SPOT ON SCALP FOR 2 WEEKS     ??? cyanocobalamin, vitamin B-12, (VITAMIN B-12 INJ) Inject as directed every thirty (30) days.     ???  cyclobenzaprine (FLEXERIL) 5 MG tablet Take 1 tablet (5 mg total) by mouth Three (3) times a day as needed for muscle spasms. 60 tablet 0   ??? docusate sodium (COLACE) 100 MG capsule Take 100 mg by mouth two (2) times a day as needed.     ??? ergocalciferol, vitamin D2, (VITAMIN D2 ORAL) Take 50,000 Units by mouth every seven (7) days.     ??? hydrocortisone 1 % cream Apply topically daily as needed.     ??? hyoscyamine sulfate (LEVSIN) 0.125 mg tablet Place 0.125 mg under the tongue daily as needed.     ??? ibuprofen (ADVIL,MOTRIN) 200 MG tablet Take 200 mg by mouth every six (6) hours as needed.     ??? Lactobacillus acidophilus (PROBIOTIC ORAL) Take by mouth.     ??? levothyroxine (SYNTHROID, LEVOTHROID) 75 MCG tablet Take 75 mcg by mouth daily.     ??? loperamide (IMODIUM A-D) 2 mg tablet Takes 2 tabs with first loose stool and then 1 tablet with each subsequent loose stool 30 tablet 5   ??? ondansetron (ZOFRAN) 8 MG tablet Take 1 tablet (8 mg total) by mouth every eight (8) hours as needed for nausea. 30 tablet 2   ??? pantoprazole (PROTONIX) 40 MG tablet Take 40 mg by mouth daily as needed.     ??? senna (SENOKOT) 8.6 mg tablet Take 1 tablet by mouth daily. ??? triamcinolone (KENALOG) 0.1 % lotion Apply topically Three (3) times a day.     ??? valACYclovir (VALTREX) 500 MG tablet two (2) times a day as needed.       No current facility-administered medications for this visit.      Facility-Administered Medications Ordered in Other Visits   Medication Dose Route Frequency Provider Last Rate Last Dose   ??? denosumab (XGEVA) 120 mg/1.7 mL (70 mg/mL) injection            ??? denosumab (XGEVA) 120 mg/1.7 mL (70 mg/mL) injection            ??? denosumab (XGEVA) 120 mg/1.7 mL (70 mg/mL) injection              Allergies   Allergen Reactions   ??? Berry Flavor Nausea And Vomiting     All berries   ??? Heparin Analogues Hives     Hives in the past but has had heparin recently with  No problems   ??? Penicillins Other (See Comments)     Reaction unknown occurred while in college  Has patient had a PCN reaction causing immediate rash, facial/tongue/throat swelling, SOB or lightheadedness with hypotension: reaction unknown  Has patient had a PCN reaction causing severe rash involving mucus membranes or skin necrosis: reaction unknown  Has patient had a PCN reaction that required hospitalization no  Has patient had a PCN reaction occurring within the last 10 years: no  If all of the above answers are NO, then may proceed with Cep  Reaction unknown occurred while in college  Has patient had a PCN reaction causing immediate rash, facial/tongue/throat swelling, SOB or lightheadedness with hypotension: reaction unknown  Has patient had a PCN reaction causing severe rash involving mucus membranes or skin necrosis: reaction unknown  Has patient had a PCN reaction that required hospitalization no  Has patient had a PCN reaction occurring within the last 10 years: no  If all of the above answers are NO, then may proceed with Cep   ??? Oxycodone Nausea Only     Social History  Social History Narrative Lives in [place] several months per year with husband [name], however primary residence is [place] (75m / yr, has an excellent PCP who has offered to help manage care while down there) and they spend up to 2 months in a family home in [place], [place].      Family History   Problem Relation Age of Onset   ??? Pancreatic cancer Father 66        died in 4 months    ??? BRCA 1/2 Sister         BRCA 2+   ??? BRCA 1/2 Brother         BRCA 2+ (2 of 7 brothers are positive)    ??? BRCA 1/2 Brother         BRCA 2+     Review of Systems: A 12-system review of systems was obtained including: Constitutional, Eyes, ENT, Cardiovascular, Respiratory, GI, GU, Musculoskeletal, Skin, Neurological, Psychiatric, Endocrine, Heme/Lymphatic, and Allergic/Immunologic systems. It is negative or non-contributory to the patient???s management except as noted in the history above. ROS performed over the phone.     Physical Examination:   There were no vitals filed for this visit. --limited secondary to video visit.  No respiratory distress, able to speak in full sentences without difficulty  Fluent speech, no dysphasia  Thought content linear and appropriate    I have personally reviewed the following diagnostic studies:    Breast imaging and pathology reviewed. See Results for details    ______________________________________________________________________    Documentation assistance was provided by Aundria Mems, Scribe, on March 30, 2019 at 12:32 AM for Susa Griffins, MD     ----------------------------------------------------------------------------------------------------------------------  March 30, 2019 4:53 PM. Documentation assistance provided by the Scribe. I was present during the time the encounter was recorded. The information recorded by the Scribe was done at my direction and has been reviewed and validated by me. ----------------------------------------------------------------------------------------------------------------------

## 2019-03-30 NOTE — Unmapped (Signed)
Administered fulvestrant and xgeva and patient tolerated well.

## 2019-03-31 NOTE — Unmapped (Signed)
I saw and evaluated the patient, participating in the key portions of the service.  I reviewed the resident???s note.  I agree with the resident???s findings and plan. Susan Robbins scans look great, she is tolerating fulvestrant + abemaciclib well. She will again move to [facility] for the winter and receive her care from a local oncologist near [place]. She will send [place] his contact information by MyChart. She will have spring scans there and as long as doing well she will return to [facility] in 09/2019 for scans and evaluation here. [name], MD

## 2019-04-06 NOTE — Unmapped (Signed)
I saw and evaluated the patient, participating in the key portions of the service.  I reviewed the resident???s note.  I agree with the resident???s findings and plan. [name], MD

## 2019-04-07 DIAGNOSIS — C50919 Malignant neoplasm of unspecified site of unspecified female breast: Principal | ICD-10-CM

## 2019-04-11 NOTE — Unmapped (Signed)
Patient stated that she was in [facility] and will be getting her injections there. She  Would like a vv on 1.27 instead of in person/scheduler will follow up with team and contact pt back

## 2019-04-19 DIAGNOSIS — C787 Secondary malignant neoplasm of liver and intrahepatic bile duct: Principal | ICD-10-CM

## 2019-04-19 DIAGNOSIS — C50919 Malignant neoplasm of unspecified site of unspecified female breast: Principal | ICD-10-CM

## 2019-05-10 NOTE — Unmapped (Signed)
Sent pt a MyChart message to inquire how she is using this medication. When I hear back will submit the refill request.

## 2019-05-11 MED ORDER — CYCLOBENZAPRINE 5 MG TABLET
ORAL_TABLET | Freq: Three times a day (TID) | ORAL | 0 refills | 20 days | Status: CP | PRN
Start: 2019-05-11 — End: ?

## 2019-06-17 NOTE — Unmapped (Signed)
AOC Triage Note     Patient: Susan Robbins     Reason for call:  return call    Time call returned: [phone]     Phone Assessment: Spoke with [facility] regarding Verzenio prescription.  Per patient she has no more refills available and that [facility] was reaching out multiple times to Dr. Gwenyth Ober office with clarification but has been unable to reach anyone in her office despite multiple times.     Triage Recommendations: Nothing is charted as a phone message regarding [place] receiving a call from [place].  Spoke with [employer] and they do not know why the September prescription was in-activated but they will re-activate and call patient to schedule shipment.     Patient Response: grateful     Outstanding tasks: nothing further needed   .     Patient Pharmacy has been verified and primary pharmacy has been marked as preferred

## 2019-06-17 NOTE — Unmapped (Signed)
Hi,    Patient Susan Robbins called requesting a medication refill for the following:    ? Medication: Verzenio  ? Dosage: 150 mg tablet  ? Days left of medication: 4  ? Pharmacy: [facility]       The expected turnaround time is 3-4 business days     Thank you,  Kelli Hope  [facility]  [phone]

## 2019-06-20 DIAGNOSIS — C50919 Malignant neoplasm of unspecified site of unspecified female breast: Principal | ICD-10-CM

## 2019-06-20 MED ORDER — ABEMACICLIB 150 MG TABLET
ORAL_TABLET | Freq: Two times a day (BID) | ORAL | 11 refills | 28.00000 days | Status: CP
Start: 2019-06-20 — End: ?

## 2019-06-20 NOTE — Unmapped (Signed)
Prescription for abamaciclib sent to [facility] pharmacy.  I called patient and left a VM with this information.

## 2019-07-12 MED ORDER — CYCLOBENZAPRINE 5 MG TABLET
ORAL_TABLET | Freq: Three times a day (TID) | ORAL | 0 refills | 20.00000 days | Status: CP | PRN
Start: 2019-07-12 — End: ?

## 2019-07-12 NOTE — Unmapped (Signed)
Pt sent MyChart message requesting refill on flexeril which she takes for Letrozole + verzenio related muscle aches. Reviewed chart; Rx for flexeril 5 mg TID PRN #60 0 RF sent 04/2019.  Will refill today to pharmacy in [place]. PDMP reviewed and no recent medications.

## 2019-08-01 NOTE — Unmapped (Signed)
I spoke with patient Susan Robbins to confirm appointments on the following date(s): 6.2 and 6.9  Benita L Hueston

## 2019-09-14 ENCOUNTER — Other Ambulatory Visit: Admit: 2019-09-14 | Discharge: 2019-09-14 | Payer: MEDICARE

## 2019-09-14 ENCOUNTER — Institutional Professional Consult (permissible substitution): Admit: 2019-09-14 | Discharge: 2019-09-15 | Payer: MEDICARE

## 2019-09-14 DIAGNOSIS — C50919 Malignant neoplasm of unspecified site of unspecified female breast: Principal | ICD-10-CM

## 2019-09-14 LAB — COMPREHENSIVE METABOLIC PANEL
ALKALINE PHOSPHATASE: 51 U/L (ref 38–126)
ALT (SGPT): 18 U/L (ref <35)
ANION GAP: 8 mmol/L (ref 7–15)
AST (SGOT): 29 U/L (ref 14–38)
BLOOD UREA NITROGEN: 19 mg/dL (ref 7–21)
BUN / CREAT RATIO: 19
CALCIUM: 9.5 mg/dL (ref 8.5–10.2)
CHLORIDE: 103 mmol/L (ref 98–107)
CO2: 27 mmol/L (ref 22.0–30.0)
CREATININE: 0.98 mg/dL (ref 0.60–1.00)
EGFR CKD-EPI AA FEMALE: 68 mL/min/{1.73_m2} (ref >=60)
EGFR CKD-EPI NON-AA FEMALE: 59 mL/min/{1.73_m2} — ABNORMAL LOW (ref >=60)
GLUCOSE RANDOM: 93 mg/dL (ref 70–179)
POTASSIUM: 4.5 mmol/L (ref 3.5–5.0)
PROTEIN TOTAL: 7.7 g/dL (ref 6.5–8.3)

## 2019-09-14 LAB — CBC W/ AUTO DIFF
BASOPHILS ABSOLUTE COUNT: 0 10*9/L (ref 0.0–0.1)
BASOPHILS RELATIVE PERCENT: 1.2 %
EOSINOPHILS ABSOLUTE COUNT: 0.1 10*9/L (ref 0.0–0.4)
EOSINOPHILS RELATIVE PERCENT: 2.2 %
HEMATOCRIT: 38.4 % (ref 36.0–46.0)
HEMOGLOBIN: 13.1 g/dL (ref 12.0–16.0)
LARGE UNSTAINED CELLS: 3 % (ref 0–4)
LYMPHOCYTES ABSOLUTE COUNT: 0.8 10*9/L — ABNORMAL LOW (ref 1.5–5.0)
LYMPHOCYTES RELATIVE PERCENT: 25.9 %
MEAN CORPUSCULAR HEMOGLOBIN CONC: 34.1 g/dL (ref 31.0–37.0)
MEAN CORPUSCULAR HEMOGLOBIN: 34.4 pg — ABNORMAL HIGH (ref 26.0–34.0)
MEAN CORPUSCULAR VOLUME: 100.7 fL — ABNORMAL HIGH (ref 80.0–100.0)
MONOCYTES ABSOLUTE COUNT: 0.2 10*9/L (ref 0.2–0.8)
MONOCYTES RELATIVE PERCENT: 6.8 %
NEUTROPHILS ABSOLUTE COUNT: 2 10*9/L (ref 2.0–7.5)
NEUTROPHILS RELATIVE PERCENT: 60.8 %
PLATELET COUNT: 187 10*9/L (ref 150–440)
RED BLOOD CELL COUNT: 3.81 10*12/L — ABNORMAL LOW (ref 4.00–5.20)
RED CELL DISTRIBUTION WIDTH: 13.5 % (ref 12.0–15.0)
WBC ADJUSTED: 3.3 10*9/L — ABNORMAL LOW (ref 4.5–11.0)

## 2019-09-14 LAB — LARGE UNSTAINED CELLS: Lab: 3

## 2019-09-14 LAB — SODIUM: Sodium:SCnc:Pt:Ser/Plas:Qn:: 138

## 2019-09-14 LAB — PHOSPHORUS: Phosphate:MCnc:Pt:Ser/Plas:Qn:: 3.9

## 2019-09-14 NOTE — Unmapped (Signed)
Labs drawn via butterfly & sent for analysis.  To next appt.  Care provided by by Laverna Peace RN.

## 2019-09-14 NOTE — Unmapped (Signed)
Pt received xgeva injection in left arm and Faslodex injection in right and left ventrogluteal. Tolerated it well. Applied guazes and band aids to sites.

## 2019-09-15 LAB — CA 27-29: Lab: 61 — ABNORMAL HIGH

## 2019-09-21 ENCOUNTER — Ambulatory Visit: Admit: 2019-09-21 | Discharge: 2019-10-04 | Payer: MEDICARE

## 2019-09-21 ENCOUNTER — Ambulatory Visit: Admit: 2019-09-21 | Discharge: 2019-09-23 | Payer: MEDICARE

## 2019-09-21 MED ADMIN — iohexoL (OMNIPAQUE) 350 mg iodine/mL solution 100 mL: 100 mL | INTRAVENOUS | @ 15:00:00 | Stop: 2019-09-21

## 2019-09-21 MED ADMIN — Technetium Tc-99m Oxidronate HDP: 20.9 | INTRAVENOUS | @ 14:00:00 | Stop: 2019-09-21

## 2019-09-28 ENCOUNTER — Ambulatory Visit
Admit: 2019-09-28 | Discharge: 2019-09-29 | Payer: MEDICARE | Attending: Hematology & Oncology | Primary: Hematology & Oncology

## 2019-09-28 ENCOUNTER — Other Ambulatory Visit: Admit: 2019-09-28 | Discharge: 2019-09-28 | Payer: MEDICARE

## 2019-09-28 DIAGNOSIS — C50919 Malignant neoplasm of unspecified site of unspecified female breast: Principal | ICD-10-CM

## 2019-09-28 LAB — CBC W/ AUTO DIFF
BASOPHILS ABSOLUTE COUNT: 0.1 10*9/L (ref 0.0–0.1)
BASOPHILS RELATIVE PERCENT: 2 %
EOSINOPHILS ABSOLUTE COUNT: 0.1 10*9/L (ref 0.0–0.4)
LARGE UNSTAINED CELLS: 3 % (ref 0–4)
LYMPHOCYTES ABSOLUTE COUNT: 1 10*9/L — ABNORMAL LOW (ref 1.5–5.0)
LYMPHOCYTES RELATIVE PERCENT: 30.9 %
MEAN CORPUSCULAR HEMOGLOBIN CONC: 34.1 g/dL (ref 31.0–37.0)
MEAN CORPUSCULAR HEMOGLOBIN: 34.4 pg — ABNORMAL HIGH (ref 26.0–34.0)
MEAN CORPUSCULAR VOLUME: 100.9 fL — ABNORMAL HIGH (ref 80.0–100.0)
MEAN PLATELET VOLUME: 6.3 fL — ABNORMAL LOW (ref 7.0–10.0)
MONOCYTES ABSOLUTE COUNT: 0.3 10*9/L (ref 0.2–0.8)
MONOCYTES RELATIVE PERCENT: 7.4 %
NEUTROPHILS ABSOLUTE COUNT: 1.8 10*9/L — ABNORMAL LOW (ref 2.0–7.5)
NEUTROPHILS RELATIVE PERCENT: 52.4 %
PLATELET COUNT: 209 10*9/L (ref 150–440)
RED BLOOD CELL COUNT: 3.55 10*12/L — ABNORMAL LOW (ref 4.00–5.20)
RED CELL DISTRIBUTION WIDTH: 13.5 % (ref 12.0–15.0)
WBC ADJUSTED: 3.4 10*9/L — ABNORMAL LOW (ref 4.5–11.0)

## 2019-09-28 LAB — LYMPHOCYTES ABSOLUTE COUNT: Lymphocytes:NCnc:Pt:Bld:Qn:Automated count: 1 — ABNORMAL LOW

## 2019-09-28 LAB — COMPREHENSIVE METABOLIC PANEL
ALBUMIN: 4.2 g/dL (ref 3.5–5.0)
ALT (SGPT): 17 U/L (ref <35)
ANION GAP: 3 mmol/L — ABNORMAL LOW (ref 7–15)
AST (SGOT): 27 U/L (ref 14–38)
BILIRUBIN TOTAL: 0.7 mg/dL (ref 0.0–1.2)
BLOOD UREA NITROGEN: 17 mg/dL (ref 7–21)
BUN / CREAT RATIO: 18
CALCIUM: 9.1 mg/dL (ref 8.5–10.2)
CHLORIDE: 106 mmol/L (ref 98–107)
CO2: 31 mmol/L — ABNORMAL HIGH (ref 22.0–30.0)
CREATININE: 0.92 mg/dL (ref 0.60–1.00)
EGFR CKD-EPI AA FEMALE: 72 mL/min/{1.73_m2} (ref >=60)
EGFR CKD-EPI NON-AA FEMALE: 63 mL/min/{1.73_m2} (ref >=60)
GLUCOSE RANDOM: 85 mg/dL (ref 70–179)
POTASSIUM: 4.3 mmol/L (ref 3.5–5.0)
SODIUM: 140 mmol/L (ref 135–145)

## 2019-09-28 LAB — PHOSPHORUS: Phosphate:MCnc:Pt:Ser/Plas:Qn:: 3.3

## 2019-09-28 LAB — EGFR CKD-EPI NON-AA FEMALE
Glomerular filtration rate/1.73 sq M.predicted.non black:ArVRat:Pt:Ser/Plas/Bld:Qn:Creatinine-based formula (CKD-EPI): 63

## 2019-09-28 NOTE — Unmapped (Signed)
It was a pleasure to see you today in the Breast Medical Oncology Clinic.      For clinical concerns during working hours, please call [phone]. My nurse navigators are Lucendia Herrlich, RN and Modena Jansky, RN.    For clinical trial questions please call the study coordinator.     For emergencies, evenings or weekends, please call [phone] and ask for the oncology fellow on call.  Reasons to call the emergency line may include:  - Fever of 100.5 or greater  - Nausea and/or vomiting not relieved with nausea medicine  - Diarrhea or constipation not relieved with bowel regimen  - Severe pain not relieved with usual pain regimen    ______________________________________________________________    [facility] on COVID-19    We are facing a challenge we have never seen before with COVID-19, the new coronavirus making its way around the world and the [place] Please be assured we are focused on providing you and your loved ones with the best possible cancer care and new treatments in the safest way possible.    There are several changes to our day-to-day activities at the [facility], the clinical home of [facility].  We made two videos to help describe the ways we are working to keep you safe, such as offering the option to visit your care team over the phone or through a video, as well as support services we continue to offer to our patients and their caregivers.    Video #1: Keeping [facility] patients safe during the COVID-19 crisis  http://go.eabjmlille.com     Video #2: Support for cancer patients and their caregivers during the COVID-19 pandemic  [email or web address].uy    We are dedicated to providing you the best care and support ??? and in the safest manner possible ??? during your cancer journey.  If you have any questions about your cancer care, please call your care team.

## 2019-09-28 NOTE — Unmapped (Signed)
Breast Cancer Return Patient Evaluation    PCP: [name], MD   PCP [facility]): Dr. [name] [phone] (fax # [phone])  [facility]: Chalmers Cater, MD ([facility], [place]) - send notes     Reason for Visit: A 69 y.o. female with metastatic HR+, HER2 negative lobular breast cancer here for continued care    Assessment/Plan:      Metastatic BRCA2-associated HR+ HER2- ILC with elevated Ca27.29 and peritoneal carcinomatosis.  See OncHistory for details. Letrozole / palbociclib 12/2016 with DR d/t neutropenia then discontinued 07/2017 d/t QoL issues. Continued on letrozole alone, discontinued 09/2018 d/t progression in liver and peritoneum, initiated fulvestrant + abemaciclib.     Stable on recent staging, doing well other than intermittent diarrhea, takes imodium daily.  She spends part of the year in [place], [place], in [place], or in [place], [place].  Has oncologists in each of these places (Drs. Nilda Riggs and Wyatt Portela).     Systemic Therapy:  -- continue fulvestrant + abemaciclib, same doses  -- continue denosumab monthly  -- Recent restaging CT/BS stable and Ca27.29 still falling. She gets q80m scans    Future Options:   1. Olaparib or talazoparib (gBRCA2 mutation)   2. Alpelisib + ET (PIK3CA mutation)   3. Clinical trial: olaparib + immunotherapy. She has a HR+ cancer which tends not to be immune activated; the trial builds upon data that PARPi can induce immune activation / infiltration that might synergize with ICI. This would require biopsies of the liver lesions as well as more visits since she will be returning per protocol.  May not be feasible if receiving care in 3 different states.     Genomics  - consented for HARMONY trial - however original tumor destroyed, insufficient 2018 sample - may consider biopsy at next evidence PD.  Also consider AURORA at next PD.  - FM1 testing (liquid and bx test from 2018) showed BRCA2 mutation + PIK3CA    Dermatology. Skin damage with a plan for topical fluorouracil. Within reason, should not interfere with her breast cancer therapy.     Neuro.  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 Monitor.     Dispo.   q85m Faslodex + abemaciclb + Xgeva, q73m CT/BS  -- 10/12/2019 Rx will be in [place] with Dr. Wyatt Portela  --RTC on 11/09/19 for labs + fulvestrant + Xgeva  --RTC on 12/07/19 for labs + fulvestrant + Xgeva  --RTC on 12/28/19 for staging scans + labs  --RTC on 01/04/20 for MD visit + fulvestrant + denosumab  --she will reach out to coordinate care for Dr. Wyatt Portela and our records    Orders are good through 01/31/20  ------------------------------------   Interval Medical / Social History:  She is accompanied by her husband for follow-up prior to C13 of fulvestrant + abemaciclib. Scans 09/21/19 are stable. Had a good 6 months in [place]. Received Faslodex + Xgeva on 09/14/19.     --diarrhea improved, but is still present- cannot eat fish or salads any longer, interestingly pizza and filet mignon do not exacerbate her diarrhea, has not tried Imodium with meals  -- colonoscopy in Jan 2021- no polyps, no issues,    -- little abdominal cramping, occasional, on hyoscyamine   -- energy is excellent; she is still walking at least four miles per day.   --ROS otherwise reviewed and is negative/normal, no new lumps or bumps, no redness/rash, no breast-related complaints    Labs (09/14/19): CMP ok. CBC ok.   Tumor markers: Ca27.29 170 (08/26/18)-->  313.1 (09/27/18) --> 350.7 (81191) --> 419.8 (11/10/18) --> 376.1 on 12/08/18 was 280.2 --> 01/05/19 was 154.9 --> 10/21 114.5 --> 03/25/19 91.4--> 09/14/19 61    Staging Scans:  - 09/21/19: CT and BS stable.     Social:  - Has seen Dr. Chalmers Cater in [place] in past, will be going back to the [place] soon and will see Dr. Wyatt Portela for treatment while in [place]  - Lives mostly in [place] - sees PCP Dr. [name] [phone] and has local oncologist to get injections + labs + scans  - Married 45+ years    Oncology History Overview Note   Seen at [facility]     Metastatic breast cancer (CMS-HCC)   06/1989 -  Other    Diagnosed with right breast DCIS     12/1997 Initial Diagnosis    Left BRCA-associated breast cancer - Stg II, 4 positive nodes, ER+. S/p bilateral mastectomy w/ recon     01/1998 - 05/1998 Chemotherapy    AC x 6 cycles and Taxol x 3 (stopped early d/t neuropathy). Dr. Cyndie Chime.     07/1998 - 08/1998 Radiation    Adjuvant radiation therapy      09/1998 Endocrine/Hormone Therapy    Started on Tamoxifen, completed 5 years 09/2003. Unable to tolerate AI      Other    BRCA testing - BRCA 2 mutation. Had hysterectomy and bilateral salpingo-oophorectomy in May 2017. Undergone EUS and seen by GI     11/2016 -  Other    See  in [facility] for assessment of ? GI bleed prompting imaging - found to not be blood (beets) but imaging found evidence disease recurrence     11/30/2016 Interval Scan(s)    CT c/a/p -c/w peritoneal carcinomatosis with peritoneal thickening and enhancement as well as mild ascites. Vague suspicious liver hypodensities. PET 9/11:  Several small hypermetabolic osseous metastases scattered in the thoracolumbar spine.  2. Widespread hypermetabolic peritoneal carcinomatosis throughout the peritoneal cavity as detailed. Small volume pelvic ascites.  3. Hypermetabolic left adrenal metastasis.  4. Hypermetabolic mediastinal nodal metastases.  5. Hypermetabolic inferior left axillary soft tissue mass adjacent  to surgical clips, compatible with soft tissue metastasis.  6. Small dependent right pleural effusion.     12/02/2016 Biopsy    Peritoneal soft tissue biopsy - metastatic lobular carcinoma. ER 100%, PR 10%, and HER2 negative (1+)     12/10/2016 Endocrine/Hormone Therapy    Letrozole / palbociclib initiated (palbo started 01/2017). Dose reduced palbo to 100mg  for persistent grade 3 neutropenia 05/2017, then palbo discontinued altogether 07/2017 d/t MSK sx and pt QoL.      12/30/2016 Genetics      Results revealed patient has the following mutation(s): Somatic tumor testing by FN1:  BRCA2 positive   PIK3CA [id number]  CCND1 amplification  FGFR1 amplification  CDH1 V15fs'36  FGF19 amplification  FGF3 amplification  FGF4 amplification  MGL1 amplificiation-equivocal   NSD3 (WHSC169)amplification  NTRK1 amplificiation- equivocal  TP53 V157G-subclonal  ZNF703 amplification      03/2017 -  Research Study Participant    HARMONY - original 1999 tumor destroyed. Met inadequate / used up       10/13/2018 -  Chemotherapy    OP BREAST FULVESTRANT / abemaciclib         Past Medical History:   Diagnosis Date   ??? BRCA positive     germline BRCA2   ??? Breast cancer (CMS-HCC) 1999   ??? Disease of thyroid  gland     hypothyroidism   ??? DVT (deep venous thrombosis) (CMS-HCC) 1973    thought provoked by accident.    ??? Hypertension    ??? Osteoporosis     on Prolia until metastatic breast cancer diagnosis     Gyn History: menopause age 39 with chemotherapy.     Past Surgical History:   Procedure Laterality Date   ??? HYSTERECTOMY     ??? MASTECTOMY Bilateral 1999     Current Outpatient Medications   Medication Sig Dispense Refill   ??? abemaciclib (VERZENIO) 150 mg Tab tablet Take 1 tablet (150 mg total) by mouth two (2) times a day with or without food. 56 tablet 11   ??? azithromycin (AZASITE) 1 % ophthalmic solution Azasite 1 % eye drops   PALCE 1 DROP IN BOTH EYES AS DIRECTED     ??? calcium carbonate (TUMS) 200 mg calcium (500 mg) chewable tablet Tums     ??? cholecalciferol, vitamin D3, 2,000 unit cap Vitamin D3 2,000 unit capsule   Take by oral route.     ??? clobetasol (TEMOVATE) 0.05 % external solution clobetasol 0.05 % scalp solution   1 APPLICATION APPLY ON THE SKIN AT BEDTIME APPLY AT BEDTIME TO SPOT ON SCALP FOR 2 WEEKS     ??? cyclobenzaprine (FLEXERIL) 5 MG tablet Take 1 tablet (5 mg total) by mouth Three (3) times a day as needed for muscle spasms. 60 tablet 0   ??? docusate sodium (COLACE) 100 MG capsule Take 100 mg by mouth two (2) times a day as needed.     ??? ergocalciferol, vitamin D2, (VITAMIN D2 ORAL) Take 50,000 Units by mouth every seven (7) days.     ??? hydrocortisone 1 % cream Apply topically daily as needed.     ??? hyoscyamine sulfate (LEVSIN) 0.125 mg tablet Place 0.125 mg under the tongue daily as needed.     ??? ibuprofen (ADVIL,MOTRIN) 200 MG tablet Take 200 mg by mouth every six (6) hours as needed.     ??? Lactobacillus acidophilus (PROBIOTIC ORAL) Take by mouth. Takes it on/off     ??? levothyroxine (SYNTHROID, LEVOTHROID) 75 MCG tablet Take 75 mcg by mouth daily.     ??? loperamide (IMODIUM A-D) 2 mg tablet Takes 2 tabs with first loose stool and then 1 tablet with each subsequent loose stool 30 tablet 5   ??? ondansetron (ZOFRAN) 8 MG tablet Take 1 tablet (8 mg total) by mouth every eight (8) hours as needed for nausea. 30 tablet 2   ??? pantoprazole (PROTONIX) 40 MG tablet Take 40 mg by mouth daily as needed.     ??? triamcinolone (KENALOG) 0.1 % lotion Apply topically Three (3) times a day.     ??? valACYclovir (VALTREX) 500 MG tablet two (2) times a day as needed.       No current facility-administered medications for this visit.     Facility-Administered Medications Ordered in Other Visits   Medication Dose Route Frequency Provider Last Rate Last Admin   ??? denosumab (XGEVA) 120 mg/1.7 mL (70 mg/mL) injection            ??? denosumab (XGEVA) 120 mg/1.7 mL (70 mg/mL) injection            ??? denosumab (XGEVA) 120 mg/1.7 mL (70 mg/mL) injection              Allergies   Allergen Reactions   ??? Berry Flavor Nausea And Vomiting     All  berries   ??? Heparin Analogues Hives     Hives in the past but has had heparin recently with  No problems   ??? Penicillins Other (See Comments)     Reaction unknown occurred while in college  Has patient had a PCN reaction causing immediate rash, facial/tongue/throat swelling, SOB or lightheadedness with hypotension: reaction unknown  Has patient had a PCN reaction causing severe rash involving mucus membranes or skin necrosis: reaction unknown  Has patient had a PCN reaction that required hospitalization no  Has patient had a PCN reaction occurring within the last 10 years: no  If all of the above answers are NO, then may proceed with Cep  Reaction unknown occurred while in college  Has patient had a PCN reaction causing immediate rash, facial/tongue/throat swelling, SOB or lightheadedness with hypotension: reaction unknown  Has patient had a PCN reaction causing severe rash involving mucus membranes or skin necrosis: reaction unknown  Has patient had a PCN reaction that required hospitalization no  Has patient had a PCN reaction occurring within the last 10 years: no  If all of the above answers are NO, then may proceed with Cep   ??? Oxycodone Nausea Only     Social History     Social History Narrative    Lives in [place] several months per year with husband [name], however primary residence is [place] (19m / yr, has an excellent PCP who has offered to help manage care while down there) and they spend up to 2 months in a family home in [place], [place].      Family History   Problem Relation Age of Onset   ??? Pancreatic cancer Father 48        died in 4 months    ??? BRCA 1/2 Sister         BRCA 2+   ??? BRCA 1/2 Brother         BRCA 2+ (2 of 7 brothers are positive)    ??? BRCA 1/2 Brother         BRCA 2+     Review of Systems: A 12-system review of systems was obtained including: Constitutional, Eyes, ENT, Cardiovascular, Respiratory, GI, GU, Musculoskeletal, Skin, Neurological, Psychiatric, Endocrine, Heme/Lymphatic, and Allergic/Immunologic systems. It is negative or non-contributory to the patient???s management except as noted in the history above. ROS performed over the phone.     Physical Examination:   Vitals:    09/28/19 1447   BP: 140/71   Pulse: 81   Resp: 16   Temp: 36.6 ??C (97.9 ??F)   SpO2: 100%      General:  Healthy-appearing female in no acute distress, accompanied by husband  Cardiovasc:  No heaves, regular, no additional sounds. No lower extremity edema  Respiratory:  Chest clear to percussion and auscultation, unlabored  Gastrointestinal:  Soft, nontender, no hepatomegaly. Normal bowel sounds. Nodule deep into the abdomen to the left and superior of umbilicus difficult to clearly palpate today (from prior exam: feels like might be in the abdominal musculature wall, indistinct, 3 x 3.5 cm, non tender)  Musculoskeletal:  No bony pain or tenderness  Skin and Subcutaneous Tissues: Patchy skin lesions scattered on her face, chronic   Psychiatric: Mood is normal.  No other symptoms.   Neuro:  Alert and oriented, no focal neurologic deficits  Upper Extremity Lymphedema: None  Breast/Chest: bilateral mastectomy without CW disease  Heme/Lymphatic/Immunologic: B/l axilla and supraclav ok. - unchanged exam today  I have personally reviewed the following diagnostic studies:    Breast imaging and pathology reviewed. See Results for details    ______________________________________________________________________    Documentation assistance was provided by Aundria Mems, Scribe, on September 27, 2019 at 9:30 PM for Joylene John, MD   ----------------------------------------------------------------------------------------------------------------------  September 28, 2019 4:48 PM. Documentation assistance provided by the Scribe. I was present during the time the encounter was recorded. The information recorded by the Scribe was done at my direction and has been reviewed and validated by me.  ----------------------------------------------------------------------------------------------------------------------

## 2019-09-29 DIAGNOSIS — C50919 Malignant neoplasm of unspecified site of unspecified female breast: Principal | ICD-10-CM

## 2019-09-29 DIAGNOSIS — Z170 Estrogen receptor positive status [ER+]: Principal | ICD-10-CM

## 2019-09-29 DIAGNOSIS — C50112 Malignant neoplasm of central portion of left female breast: Principal | ICD-10-CM

## 2019-09-29 LAB — CA 27-29: Lab: 65 — ABNORMAL HIGH

## 2019-10-11 NOTE — Unmapped (Signed)
Diannia Ruder with [facility] contacted [facility] today to discuss that she needs labs and the last visit note and it needs to include the date of the last injection. Contact information is [phone].       Thank you,   Noland Fordyce  [facility]  [phone]

## 2019-11-09 ENCOUNTER — Other Ambulatory Visit: Admit: 2019-11-09 | Discharge: 2019-11-10 | Payer: MEDICARE

## 2019-11-09 ENCOUNTER — Institutional Professional Consult (permissible substitution): Admit: 2019-11-09 | Discharge: 2019-11-09 | Payer: MEDICARE

## 2019-11-09 DIAGNOSIS — C50919 Malignant neoplasm of unspecified site of unspecified female breast: Principal | ICD-10-CM

## 2019-11-09 LAB — CREATININE: Creatinine:MCnc:Pt:Ser/Plas:Qn:: 1.02 — ABNORMAL HIGH

## 2019-11-09 LAB — COMPREHENSIVE METABOLIC PANEL
ALBUMIN: 3.3 g/dL — ABNORMAL LOW (ref 3.4–5.0)
ALKALINE PHOSPHATASE: 39 U/L — ABNORMAL LOW (ref 46–116)
ALT (SGPT): 15 U/L (ref 10–49)
ANION GAP: 3 mmol/L — ABNORMAL LOW (ref 5–14)
AST (SGOT): 19 U/L (ref <=34)
BILIRUBIN TOTAL: 0.7 mg/dL (ref 0.3–1.2)
BLOOD UREA NITROGEN: 15 mg/dL (ref 9–23)
BUN / CREAT RATIO: 15
CALCIUM: 9.5 mg/dL (ref 8.7–10.4)
CHLORIDE: 103 mmol/L (ref 98–107)
CO2: 30 mmol/L (ref 20.0–31.0)
CREATININE: 1.02 mg/dL — ABNORMAL HIGH
EGFR CKD-EPI AA FEMALE: 64 mL/min/{1.73_m2} (ref >=60)
EGFR CKD-EPI NON-AA FEMALE: 55 mL/min/{1.73_m2} — ABNORMAL LOW (ref >=60)
POTASSIUM: 3.8 mmol/L (ref 3.5–5.1)
PROTEIN TOTAL: 6.5 g/dL (ref 5.7–8.2)
SODIUM: 136 mmol/L (ref 135–145)

## 2019-11-09 LAB — CBC W/ AUTO DIFF
BASOPHILS ABSOLUTE COUNT: 0.1 10*9/L (ref 0.0–0.1)
BASOPHILS RELATIVE PERCENT: 1.5 %
EOSINOPHILS ABSOLUTE COUNT: 0.1 10*9/L (ref 0.0–0.4)
EOSINOPHILS RELATIVE PERCENT: 2.5 %
HEMATOCRIT: 35.3 % — ABNORMAL LOW (ref 36.0–46.0)
HEMOGLOBIN: 11.8 g/dL — ABNORMAL LOW (ref 12.0–16.0)
LARGE UNSTAINED CELLS: 3 % (ref 0–4)
LYMPHOCYTES ABSOLUTE COUNT: 0.7 10*9/L — ABNORMAL LOW (ref 1.5–5.0)
LYMPHOCYTES RELATIVE PERCENT: 20.6 %
MEAN CORPUSCULAR HEMOGLOBIN CONC: 33.5 g/dL (ref 31.0–37.0)
MEAN CORPUSCULAR HEMOGLOBIN: 34.2 pg — ABNORMAL HIGH (ref 26.0–34.0)
MEAN CORPUSCULAR VOLUME: 101.8 fL — ABNORMAL HIGH (ref 80.0–100.0)
MEAN PLATELET VOLUME: 7.3 fL (ref 7.0–10.0)
MONOCYTES ABSOLUTE COUNT: 0.2 10*9/L (ref 0.2–0.8)
MONOCYTES RELATIVE PERCENT: 6.7 %
PLATELET COUNT: 176 10*9/L (ref 150–440)
RED BLOOD CELL COUNT: 3.47 10*12/L — ABNORMAL LOW (ref 4.00–5.20)
RED CELL DISTRIBUTION WIDTH: 13.5 % (ref 12.0–15.0)
WBC ADJUSTED: 3.5 10*9/L — ABNORMAL LOW (ref 4.5–11.0)

## 2019-11-09 LAB — PHOSPHORUS: Phosphate:MCnc:Pt:Ser/Plas:Qn:: 3.9

## 2019-11-09 LAB — CA 27-29: Lab: 66.1 — ABNORMAL HIGH

## 2019-11-09 LAB — LYMPHOCYTES ABSOLUTE COUNT: Lymphocytes:NCnc:Pt:Bld:Qn:Automated count: 0.7 — ABNORMAL LOW

## 2019-11-09 MED ADMIN — denosumab (XGEVA) injection 120 mg: 120 mg | SUBCUTANEOUS | @ 19:00:00 | Stop: 2019-11-09

## 2019-11-09 MED ADMIN — fulvestrant (FASLODEX) injection 500 mg: 500 mg | INTRAMUSCULAR | @ 20:00:00 | Stop: 2019-11-09

## 2019-11-09 NOTE — Unmapped (Signed)
Reviewed lab. Patient received xgeva injection. Tolerated well. Applied a guaze and a band aid.

## 2019-11-09 NOTE — Unmapped (Signed)
Pt tolerated Falsodex injections without difficulty. PT left [facility] clinic ambulatory,steady gait, NAD, no questions, complaints, nor concerns voiced at d/c.

## 2019-11-20 DIAGNOSIS — C50919 Malignant neoplasm of unspecified site of unspecified female breast: Principal | ICD-10-CM

## 2019-11-23 DIAGNOSIS — C50919 Malignant neoplasm of unspecified site of unspecified female breast: Principal | ICD-10-CM

## 2019-12-05 DIAGNOSIS — Z8639 Personal history of other endocrine, nutritional and metabolic disease: Principal | ICD-10-CM

## 2019-12-05 DIAGNOSIS — C50919 Malignant neoplasm of unspecified site of unspecified female breast: Principal | ICD-10-CM

## 2019-12-07 ENCOUNTER — Encounter: Admit: 2019-12-07 | Discharge: 2019-12-08 | Payer: MEDICARE

## 2019-12-07 ENCOUNTER — Encounter
Admit: 2019-12-07 | Discharge: 2019-12-08 | Payer: MEDICARE | Attending: Hematology & Oncology | Primary: Hematology & Oncology

## 2019-12-07 ENCOUNTER — Other Ambulatory Visit: Admit: 2019-12-07 | Discharge: 2019-12-08 | Payer: MEDICARE

## 2019-12-07 ENCOUNTER — Institutional Professional Consult (permissible substitution): Admit: 2019-12-07 | Discharge: 2019-12-08 | Payer: MEDICARE

## 2019-12-07 DIAGNOSIS — Z8639 Personal history of other endocrine, nutritional and metabolic disease: Principal | ICD-10-CM

## 2019-12-07 DIAGNOSIS — C50919 Malignant neoplasm of unspecified site of unspecified female breast: Principal | ICD-10-CM

## 2019-12-07 LAB — COMPREHENSIVE METABOLIC PANEL
ALBUMIN: 3.5 g/dL (ref 3.4–5.0)
ALKALINE PHOSPHATASE: 44 U/L — ABNORMAL LOW (ref 46–116)
ALT (SGPT): 13 U/L (ref 10–49)
ANION GAP: 4 mmol/L — ABNORMAL LOW (ref 5–14)
AST (SGOT): 20 U/L (ref <=34)
BILIRUBIN TOTAL: 0.5 mg/dL (ref 0.3–1.2)
BLOOD UREA NITROGEN: 22 mg/dL (ref 9–23)
BUN / CREAT RATIO: 22
CALCIUM: 10.2 mg/dL (ref 8.7–10.4)
CHLORIDE: 105 mmol/L (ref 98–107)
CO2: 28 mmol/L (ref 20.0–31.0)
CREATININE: 1.02 mg/dL — ABNORMAL HIGH
EGFR CKD-EPI AA FEMALE: 64 mL/min/{1.73_m2} (ref >=60)
EGFR CKD-EPI NON-AA FEMALE: 55 mL/min/{1.73_m2} — ABNORMAL LOW (ref >=60)
GLUCOSE RANDOM: 89 mg/dL (ref 70–179)
POTASSIUM: 4 mmol/L (ref 3.5–5.1)
PROTEIN TOTAL: 7 g/dL (ref 5.7–8.2)
SODIUM: 137 mmol/L (ref 135–145)

## 2019-12-07 LAB — CBC W/ AUTO DIFF
BASOPHILS ABSOLUTE COUNT: 0.1 10*9/L (ref 0.0–0.1)
BASOPHILS RELATIVE PERCENT: 1.8 %
EOSINOPHILS ABSOLUTE COUNT: 0.1 10*9/L (ref 0.0–0.4)
EOSINOPHILS RELATIVE PERCENT: 4 %
HEMATOCRIT: 35.3 % — ABNORMAL LOW (ref 36.0–46.0)
HEMOGLOBIN: 12 g/dL (ref 12.0–16.0)
LARGE UNSTAINED CELLS: 4 % (ref 0–4)
LYMPHOCYTES ABSOLUTE COUNT: 0.7 10*9/L — ABNORMAL LOW (ref 1.5–5.0)
LYMPHOCYTES RELATIVE PERCENT: 22.3 %
MEAN CORPUSCULAR HEMOGLOBIN CONC: 33.9 g/dL (ref 31.0–37.0)
MEAN CORPUSCULAR HEMOGLOBIN: 34.2 pg — ABNORMAL HIGH (ref 26.0–34.0)
MEAN CORPUSCULAR VOLUME: 100.7 fL — ABNORMAL HIGH (ref 80.0–100.0)
MEAN PLATELET VOLUME: 8 fL (ref 7.0–10.0)
MONOCYTES ABSOLUTE COUNT: 0.2 10*9/L (ref 0.2–0.8)
MONOCYTES RELATIVE PERCENT: 7.3 %
NEUTROPHILS ABSOLUTE COUNT: 1.9 10*9/L — ABNORMAL LOW (ref 2.0–7.5)
NEUTROPHILS RELATIVE PERCENT: 61 %
PLATELET COUNT: 190 10*9/L (ref 150–440)
RED BLOOD CELL COUNT: 3.51 10*12/L — ABNORMAL LOW (ref 4.00–5.20)
RED CELL DISTRIBUTION WIDTH: 13.4 % (ref 12.0–15.0)
WBC ADJUSTED: 3.1 10*9/L — ABNORMAL LOW (ref 4.5–11.0)

## 2019-12-07 LAB — CANCER ANTIGEN 27.29: CA 27-29: 72.4 U/mL — ABNORMAL HIGH (ref <=38.6)

## 2019-12-07 LAB — VITAMIN B12: VITAMIN B-12: 587 pg/mL (ref 211–911)

## 2019-12-07 MED ADMIN — denosumab (XGEVA) injection 120 mg: 120 mg | SUBCUTANEOUS | @ 15:00:00 | Stop: 2019-12-07

## 2019-12-07 MED ADMIN — fulvestrant (FASLODEX) injection 500 mg: 500 mg | INTRAMUSCULAR | @ 15:00:00 | Stop: 2019-12-07

## 2019-12-07 NOTE — Unmapped (Signed)
Patient arrived for lab draw. Labs obtain via venipuncture in left [facility] with 23g butterfly. Tolerated well.

## 2019-12-07 NOTE — Unmapped (Signed)
Reviewed lab. Patient received xgeva and faslodex injection. Tolerated well. Applied a guazes and a band aids.

## 2019-12-29 ENCOUNTER — Ambulatory Visit: Admit: 2019-12-29 | Discharge: 2020-01-10 | Payer: MEDICARE

## 2019-12-29 ENCOUNTER — Ambulatory Visit: Admit: 2019-12-29 | Discharge: 2019-12-31 | Payer: MEDICARE

## 2019-12-29 ENCOUNTER — Other Ambulatory Visit: Admit: 2019-12-29 | Discharge: 2019-12-30 | Payer: MEDICARE

## 2019-12-29 ENCOUNTER — Ambulatory Visit: Admit: 2019-12-29 | Discharge: 2019-12-30 | Payer: MEDICARE

## 2019-12-29 LAB — CA 27-29: Lab: 71.5 — ABNORMAL HIGH

## 2019-12-29 MED ADMIN — Technetium Tc-99m Oxidronate HDP: 19.1 | INTRAVENOUS | @ 14:00:00 | Stop: 2019-12-29

## 2019-12-29 MED ADMIN — iohexoL (OMNIPAQUE) 350 mg iodine/mL solution 100 mL: 100 mL | INTRAVENOUS | @ 15:00:00 | Stop: 2019-12-29

## 2020-01-03 NOTE — Unmapped (Signed)
Breast Cancer Return Patient Evaluation    PCP: [name], MD   PCP [facility]): Dr. [name] [phone] (fax # [phone])  [facility]: Chalmers Cater, MD ([facility], [place]) - send notes     Reason for Visit: A 69 y.o. 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  See OncHistory for details. Letrozole / palbociclib 12/2016 with DR d/t neutropenia then discontinued 07/2017 d/t QoL issues. Continued on letrozole alone, discontinued 09/2018 d/t progression in liver and peritoneum, initiated fulvestrant + abemaciclib.     Stable on recent staging, doing well other than intermittent diarrhea, takes imodium daily.  She spends part of the year in [place], [place], in [place], or in [place], [place]. Has oncologists in each of these places (Drs. Nilda Riggs and Wyatt Portela).     Systemic Therapy:  -- Continue fulvestrant + abemaciclib, same doses  -- Continue denosumab monthly  -- Recent restaging CT/BS stable and Ca27.29 still falling. She gets q73m scans    Future Options:   1. Olaparib or talazoparib (gBRCA2 mutation)   2. Alpelisib + ET (PIK3CA mutation)   3. Clinical trial: olaparib + immunotherapy. She has a HR+ cancer which tends not to be immune activated; the trial builds upon data that PARPi can induce immune activation / infiltration that might synergize with ICI. This would require biopsies of the liver lesions as well as more visits since she will be returning per protocol.  May not be feasible if receiving care in 3 different states.     Genomics  -- Consented for [employer] trial - however original tumor destroyed, insufficient 2018 sample - may consider biopsy at next evidence PD.  Also consider [facility] at next PD.  -- FM1 testing (liquid and bx test from 2018) showed BRCA2 mutation + PIK3CA    Dermatology. Skin damage with a plan for topical fluorouracil. Within reason, should not interfere with her breast cancer therapy.     Neuro. 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 Monitor.     Diarrhea. Usually has 4-6 episodes daily. Has tried changing her diet, without relief. Uses Imodium, with some relief.Is affecting QoL some days due to frequency of BMs.   -- Will start bile acid sequestrants 01/04/20.     Disposition:    --q51m Faslodex + abemaciclb + Xgeva, q66m CT/BS; next scans due 12/21  --RTC on 02/01/20 for MD visit + fulvestrant + denosumab + labs   -- She will reach out to coordinate care for Dr. Wyatt Portela and our records  --Colestipol for increased diarrhea.     Orders are good through 01/31/20  ------------------------------------   Interval Medical/ROS:  She is unaccompanied for follow-up prior to C12 of fulvestrant + abemaciclib. Scans 12/30/19 are stable.   -- Doing well overall.   -- Constant diarrhea. Usually has 4-6 episodes daily. Has tried changing her diet, without relief. Using Imodium, without relief.   -- Weight gain of two pounds.   -- Left sided weakness. Difficulty standing on her left leg. Walking straight is fine. Will start PT.   -- Energy is excellent; she is still walking at least 3-4 miles per day.   -- No new breast changes.   -- Full 12 ROS reviewed and mild/none.     -- Will receive flu shot today get COVID-19 booster next week     Labs (12/30/19): CMP ok. CBC ok.  Tumor markers: Ca27.29 170 (08/26/18)--> 313.1 (09/27/18) --> 350.7 (16109) --> 419.8 (11/10/18) --> 376.1 (11/24/18) -- > 280.2 (12/08/18) --> 154.9 (01/05/19) --> 114.5 (02/02/19) --> 91.4 (03/25/19) --> 61 (09/14/19) --> 65 (09/28/19) --> 66.1 (11/09/19) --> 72.4 (12/07/19) --> 71.5 (12/29/19)    Staging Scans:  -- 12/30/19: CT C/A/P and NM bone scan: stable.      Soc Hx: Has seen Dr. Chalmers Cater in [place] in past, will be going back to the [place] soon and will see Dr. Wyatt Portela for treatment while in [place]. Lives mostly in [place] - sees PCP Dr. [name] [phone] and has local oncologist to get injections + labs + scans. Married 45+ years. Son recently got engaged at the [employer]. She has a website [email or web address] where she shares her cancer story to help others in her situation.     History of present illness  Susan Robbins is a 69 y.o. female who presents for recommendations regarding the management of her breast cancer. I have reviewed her oncology history, which is summarized below.     Oncology History Overview Note   Seen at [facility]     Metastatic breast cancer (CMS-HCC)   06/1989 -  Other    Diagnosed with right breast DCIS     12/1997 Initial Diagnosis    Left BRCA-associated breast cancer - Stg II, 4 positive nodes, ER+. S/p bilateral mastectomy w/ recon     01/1998 - 05/1998 Chemotherapy    AC x 6 cycles and Taxol x 3 (stopped early d/t neuropathy). Dr. Cyndie Chime.     07/1998 - 08/1998 Radiation    Adjuvant radiation therapy      09/1998 Endocrine/Hormone Therapy    Started on Tamoxifen, completed 5 years 09/2003. Unable to tolerate AI      Other    BRCA testing - BRCA 2 mutation. Had hysterectomy and bilateral salpingo-oophorectomy in May 2017. Undergone EUS and seen by GI     11/2016 -  Other    See  in [facility] for assessment of ? GI bleed prompting imaging - found to not be blood (beets) but imaging found evidence disease recurrence     11/30/2016 Interval Scan(s)    CT c/a/p -c/w peritoneal carcinomatosis with peritoneal thickening and enhancement as well as mild ascites. Vague suspicious liver hypodensities. PET 9/11:  Several small hypermetabolic osseous metastases scattered in the thoracolumbar spine.  2. Widespread hypermetabolic peritoneal carcinomatosis throughout the peritoneal cavity as detailed. Small volume pelvic ascites.  3. Hypermetabolic left adrenal metastasis.  4. Hypermetabolic mediastinal nodal metastases.  5. Hypermetabolic inferior left axillary soft tissue mass adjacent  to surgical clips, compatible with soft tissue metastasis.  6. Small dependent right pleural effusion.     12/02/2016 Biopsy    Peritoneal soft tissue biopsy - metastatic lobular carcinoma. ER 100%, PR 10%, and HER2 negative (1+)     12/10/2016 Endocrine/Hormone Therapy    Letrozole / palbociclib initiated (palbo started 01/2017). Dose reduced palbo to 100mg  for persistent grade 3 neutropenia 05/2017, then palbo discontinued altogether 07/2017 d/t MSK sx and pt QoL.      12/30/2016 Genetics      Results revealed patient has the following mutation(s): Somatic tumor testing by FN[facility]:  BRCA2 positive   PIK3CA [id number]  CCND1 amplification  FGFR1 amplification  CDH1 V171fs'36  FGF19 amplification  FGF3 amplification  FGF4 amplification  MGL1 amplificiation-equivocal   NSD3 (WHSC169)amplification  NTRK1 amplificiation- equivocal  TP53 V157G-subclonal  ZNF703 amplification  03/2017 -  Research Study Participant    HARMONY - original 1999 tumor destroyed. Met inadequate / used up       10/13/2018 -  Chemotherapy    OP BREAST FULVESTRANT / abemaciclib         Past Medical History:   Diagnosis Date   ??? BRCA positive     germline BRCA2   ??? Breast cancer (CMS-HCC) 1999   ??? Disease of thyroid gland     hypothyroidism   ??? DVT (deep venous thrombosis) (CMS-HCC) 1973    thought provoked by accident.    ??? Hypertension    ??? Osteoporosis     on Prolia until metastatic breast cancer diagnosis     Gyn History: Two children. Menopause age 14 with chemotherapy.     Past Surgical History:   Procedure Laterality Date   ??? HYSTERECTOMY     ??? MASTECTOMY Bilateral 1999     Current Outpatient Medications   Medication Sig Dispense Refill   ??? abemaciclib (VERZENIO) 150 mg Tab tablet Take 1 tablet (150 mg total) by mouth two (2) times a day with or without food. 56 tablet 11   ??? azithromycin (AZASITE) 1 % ophthalmic solution Azasite 1 % eye drops   PALCE 1 DROP IN BOTH EYES AS DIRECTED     ??? calcium carbonate (TUMS) 200 mg calcium (500 mg) chewable tablet Tums     ??? cholecalciferol, vitamin D3, 2,000 unit cap Vitamin D3 2,000 unit capsule   Take by oral route.     ??? clobetasol (TEMOVATE) 0.05 % external solution clobetasol 0.05 % scalp solution   1 APPLICATION APPLY ON THE SKIN AT BEDTIME APPLY AT BEDTIME TO SPOT ON SCALP FOR 2 WEEKS     ??? cyclobenzaprine (FLEXERIL) 5 MG tablet Take 1 tablet (5 mg total) by mouth Three (3) times a day as needed for muscle spasms. 60 tablet 0   ??? docusate sodium (COLACE) 100 MG capsule Take 100 mg by mouth two (2) times a day as needed.     ??? ergocalciferol, vitamin D2, (VITAMIN D2 ORAL) Take 50,000 Units by mouth every seven (7) days.     ??? hydrocortisone 1 % cream Apply topically daily as needed.     ??? hyoscyamine sulfate (LEVSIN) 0.125 mg tablet Place 0.125 mg under the tongue daily as needed.     ??? ibuprofen (ADVIL,MOTRIN) 200 MG tablet Take 200 mg by mouth every six (6) hours as needed.     ??? Lactobacillus acidophilus (PROBIOTIC ORAL) Take by mouth. Takes it on/off     ??? Lactobacillus acidophilus 10 billion cell cap Probiotic     ??? levothyroxine (SYNTHROID, LEVOTHROID) 75 MCG tablet Take 75 mcg by mouth daily.     ??? loperamide (IMODIUM A-D) 2 mg tablet Takes 2 tabs with first loose stool and then 1 tablet with each subsequent loose stool 30 tablet 5   ??? ondansetron (ZOFRAN) 8 MG tablet Take 1 tablet (8 mg total) by mouth every eight (8) hours as needed for nausea. 30 tablet 2   ??? pantoprazole (PROTONIX) 40 MG tablet Take 40 mg by mouth daily as needed.     ??? triamcinolone (KENALOG) 0.1 % lotion Apply topically Three (3) times a day.     ??? valACYclovir (VALTREX) 500 MG tablet two (2) times a day as needed.       Current Facility-Administered Medications   Medication Dose Route Frequency Provider Last Rate Last Admin   ??? fulvestrant (FASLODEX)  injection 500 mg  500 mg Intramuscular Once Joylene John, MD         Facility-Administered Medications Ordered in Other Visits   Medication Dose Route Frequency Provider Last Rate Last Admin   ??? denosumab (XGEVA) 120 mg/1.7 mL (70 mg/mL) injection ??? denosumab (XGEVA) 120 mg/1.7 mL (70 mg/mL) injection            ??? denosumab (XGEVA) 120 mg/1.7 mL (70 mg/mL) injection              Allergies   Allergen Reactions   ??? Berry Flavor Nausea And Vomiting     All berries   ??? Heparin Analogues Hives     Hives in the past but has had heparin recently with  No problems   ??? Penicillins Other (See Comments)     Reaction unknown occurred while in college  Has patient had a PCN reaction causing immediate rash, facial/tongue/throat swelling, SOB or lightheadedness with hypotension: reaction unknown  Has patient had a PCN reaction causing severe rash involving mucus membranes or skin necrosis: reaction unknown  Has patient had a PCN reaction that required hospitalization no  Has patient had a PCN reaction occurring within the last 10 years: no  If all of the above answers are NO, then may proceed with Cep  Reaction unknown occurred while in college  Has patient had a PCN reaction causing immediate rash, facial/tongue/throat swelling, SOB or lightheadedness with hypotension: reaction unknown  Has patient had a PCN reaction causing severe rash involving mucus membranes or skin necrosis: reaction unknown  Has patient had a PCN reaction that required hospitalization no  Has patient had a PCN reaction occurring within the last 10 years: no  If all of the above answers are NO, then may proceed with Cep   ??? Oxycodone Nausea Only     Social History     Social History Narrative    Lives in [place] several months per year with husband [name], however primary residence is [place] (38m / yr, has an excellent PCP who has offered to help manage care while down there) and they spend up to 2 months in a family home in [place], [place].      Family History   Problem Relation Age of Onset   ??? Pancreatic cancer Father 30        died in 4 months    ??? BRCA 1/2 Sister         BRCA 2+   ??? BRCA 1/2 Brother         BRCA 2+ (2 of 7 brothers are positive)    ??? BRCA 1/2 Brother         BRCA 2+     Review of Systems: A 12-system review of systems was obtained including: Constitutional, Eyes, ENT, Cardiovascular, Respiratory, GI, GU, Musculoskeletal, Skin, Neurological, Psychiatric, Endocrine, Heme/Lymphatic, and Allergic/Immunologic systems. It is negative or non-contributory to the patient???s management except as noted in the history above. ROS performed over the phone.     Physical Examination:   Vitals:    01/04/20 1439   BP: 128/73   Pulse: 78   Resp: 17   Temp: 36.3 ??C (97.3 ??F)   SpO2: 100%      General:  Healthy-appearing female in no acute distress, accompanied by husband  Cardiovasc:  No heaves, regular, no additional sounds. No lower extremity edema  Respiratory:  Chest clear to percussion and auscultation, unlabored  Gastrointestinal:  Soft, nontender, no  hepatomegaly. Normal bowel sounds. Nodule deep into the abdomen to the left and superior of umbilicus difficult to clearly palpate today (from prior exam: feels like might be in the abdominal musculature wall, indistinct, 3 x 3.5 cm, non tender)  Musculoskeletal:  No bony pain or tenderness  Skin and Subcutaneous Tissues: Patchy skin lesions scattered on her face, chronic   Psychiatric: Mood is normal.  No other symptoms.   Neuro:  Alert and oriented, no focal neurologic deficits  Upper Extremity Lymphedema: None  Breast/Chest: bilateral mastectomy without CW disease  Heme/Lymphatic/Immunologic: B/l axilla and supraclav ok. - unchanged exam today     I have personally reviewed the following diagnostic studies:    Breast imaging and pathology reviewed. See Results for details  ______________________________________________________________________    Documentation assistance was provided by Norwood Levo, Scribe, on January 04, 2020 at 2:30 PM for [facility], [place]    ----------------------------------------------------------------------------------------------------------------------  January 04, 2020 4:15 PM. Documentation assistance provided by the Scribe. I was present during the time the encounter was recorded. The information recorded by the Scribe was done at my direction and has been reviewed and validated by me.  ----------------------------------------------------------------------------------------------------------------------

## 2020-01-04 ENCOUNTER — Ambulatory Visit: Admit: 2020-01-04 | Discharge: 2020-01-04 | Payer: MEDICARE

## 2020-01-04 ENCOUNTER — Other Ambulatory Visit: Admit: 2020-01-04 | Discharge: 2020-01-04 | Payer: MEDICARE

## 2020-01-04 ENCOUNTER — Encounter: Admit: 2020-01-04 | Discharge: 2020-01-04 | Payer: MEDICARE

## 2020-01-04 DIAGNOSIS — C50919 Malignant neoplasm of unspecified site of unspecified female breast: Principal | ICD-10-CM

## 2020-01-04 LAB — RED BLOOD CELL COUNT: Lab: 3.42 — ABNORMAL LOW

## 2020-01-04 LAB — CBC W/ AUTO DIFF
BASOPHILS ABSOLUTE COUNT: 0.1 10*9/L (ref 0.0–0.1)
BASOPHILS RELATIVE PERCENT: 1.4 %
EOSINOPHILS ABSOLUTE COUNT: 0.1 10*9/L (ref 0.0–0.4)
EOSINOPHILS RELATIVE PERCENT: 2.6 %
HEMATOCRIT: 34.4 % — ABNORMAL LOW (ref 36.0–46.0)
HEMOGLOBIN: 11.7 g/dL — ABNORMAL LOW (ref 12.0–16.0)
LARGE UNSTAINED CELLS: 7 % — ABNORMAL HIGH (ref 0–4)
LYMPHOCYTES ABSOLUTE COUNT: 1 10*9/L — ABNORMAL LOW (ref 1.5–5.0)
LYMPHOCYTES RELATIVE PERCENT: 26.1 %
MEAN CORPUSCULAR HEMOGLOBIN CONC: 34 g/dL (ref 31.0–37.0)
MEAN CORPUSCULAR HEMOGLOBIN: 34.2 pg — ABNORMAL HIGH (ref 26.0–34.0)
MEAN PLATELET VOLUME: 7.4 fL (ref 7.0–10.0)
MONOCYTES ABSOLUTE COUNT: 0.3 10*9/L (ref 0.2–0.8)
MONOCYTES RELATIVE PERCENT: 8 %
NEUTROPHILS ABSOLUTE COUNT: 2.1 10*9/L (ref 2.0–7.5)
NEUTROPHILS RELATIVE PERCENT: 54.4 %
PLATELET COUNT: 227 10*9/L (ref 150–440)
RED BLOOD CELL COUNT: 3.42 10*12/L — ABNORMAL LOW (ref 4.00–5.20)
RED CELL DISTRIBUTION WIDTH: 13.7 % (ref 12.0–15.0)
WBC ADJUSTED: 3.8 10*9/L — ABNORMAL LOW (ref 4.5–11.0)

## 2020-01-04 LAB — COMPREHENSIVE METABOLIC PANEL
ALBUMIN: 3.4 g/dL (ref 3.4–5.0)
ALKALINE PHOSPHATASE: 36 U/L — ABNORMAL LOW (ref 46–116)
ALT (SGPT): 16 U/L (ref 10–49)
ANION GAP: 5 mmol/L (ref 5–14)
AST (SGOT): 21 U/L (ref <=34)
BILIRUBIN TOTAL: 0.6 mg/dL (ref 0.3–1.2)
BUN / CREAT RATIO: 17
CALCIUM: 9.2 mg/dL (ref 8.7–10.4)
CHLORIDE: 104 mmol/L (ref 98–107)
CO2: 26 mmol/L (ref 20.0–31.0)
CREATININE: 1 mg/dL
EGFR CKD-EPI AA FEMALE: 65 mL/min/{1.73_m2} (ref >=60)
EGFR CKD-EPI NON-AA FEMALE: 57 mL/min/{1.73_m2} — ABNORMAL LOW (ref >=60)
GLUCOSE RANDOM: 83 mg/dL (ref 70–179)
POTASSIUM: 3.9 mmol/L (ref 3.4–4.5)
PROTEIN TOTAL: 6.8 g/dL (ref 5.7–8.2)
SODIUM: 135 mmol/L (ref 135–145)

## 2020-01-04 LAB — SMEAR REVIEW

## 2020-01-04 LAB — SLIDE REVIEW

## 2020-01-04 LAB — ANION GAP: Anion gap 3:SCnc:Pt:Ser/Plas:Qn:: 5

## 2020-01-04 MED ORDER — COLESTIPOL 1 GRAM TABLET
ORAL_TABLET | Freq: Two times a day (BID) | ORAL | 1 refills | 90 days | Status: CP
Start: 2020-01-04 — End: 2021-01-03

## 2020-01-04 MED ADMIN — fulvestrant (FASLODEX) injection 500 mg: 500 mg | INTRAMUSCULAR | @ 20:00:00 | Stop: 2020-01-04

## 2020-01-04 MED ADMIN — denosumab (XGEVA) injection 120 mg: 120 mg | SUBCUTANEOUS | @ 20:00:00 | Stop: 2020-01-04

## 2020-01-04 NOTE — Unmapped (Incomplete)
.  It was a pleasure to see you today in the Breast Medical Oncology Clinic.      Use Colestipol for diarrhea.     For clinical concerns during working hours, please call [phone]. My nurse navigators are Lucendia Herrlich, RN and Modena Jansky, RN.    For clinical trial questions please call the study coordinator.     For emergencies, evenings or weekends, please call [phone] and ask for the oncology fellow on call.  Reasons to call the emergency line may include:  - Fever of 100.5 or greater  - Nausea and/or vomiting not relieved with nausea medicine  - Diarrhea or constipation not relieved with bowel regimen  - Severe pain not relieved with usual pain regimen    ______________________________________________________________    [facility] on COVID-19    We are facing a challenge we have never seen before with COVID-19, the new coronavirus making its way around the world and the [place] Please be assured we are focused on providing you and your loved ones with the best possible cancer care and new treatments in the safest way possible.    There are several changes to our day-to-day activities at the [facility], the clinical home of [facility].  We made two videos to help describe the ways we are working to keep you safe, such as offering the option to visit your care team over the phone or through a video, as well as support services we continue to offer to our patients and their caregivers.    Video #1: Keeping [facility] patients safe during the COVID-19 crisis  [email or web address]     Video #2: Support for cancer patients and their caregivers during the COVID-19 pandemic  [email or web address]    We are dedicated to providing you the best care and support ??? and in the safest manner possible ??? during your cancer journey.  If you have any questions about your cancer care, please call your care team.

## 2020-01-04 NOTE — Unmapped (Signed)
Pt tolerated Faslodex, Xgeva, injections as well as flu vaccine without difficulty. PT left [facility] ambulatory,steady gait, NAD, no questions, complaints, nor concerns voiced at d/c.

## 2020-01-21 DIAGNOSIS — C50919 Malignant neoplasm of unspecified site of unspecified female breast: Principal | ICD-10-CM

## 2020-01-31 NOTE — Unmapped (Signed)
Breast Cancer Return Patient Evaluation    PCP: [name], MD   PCP [facility]): Dr. [name] [phone] (fax # [phone])  [facility]: Chalmers Cater, MD ([facility], [place]) - send notes     Reason for Visit: A 69 y.o. 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  See OncHistory for details. Letrozole / palbociclib 12/2016 with DR d/t neutropenia then discontinued 07/2017 d/t QoL issues. Continued on letrozole alone, discontinued 09/2018 d/t progression in liver and peritoneum, initiated fulvestrant + abemaciclib.     Stable on recent staging, doing well other than intermittent diarrhea, takes imodium daily, and bilateral leg weakness. She spends part of the year in [place], [place], in [place], or in [place], [place]. Has oncologists in each of these places (Drs. Nilda Riggs and Wyatt Portela).     Systemic Therapy:  -- Continue fulvestrant + abemaciclib, same doses  -- Continue denosumab monthly  -- Recent restaging CT/BS stable. She gets q30m scans    Future Options:   1. Olaparib or talazoparib (gBRCA2 mutation)   2. Alpelisib + ET (PIK3CA mutation)   3. Clinical trial: olaparib + immunotherapy. She has a HR+ cancer which tends not to be immune activated; the trial builds upon data that PARPi can induce immune activation / infiltration that might synergize with ICI. This would require biopsies of the liver lesions as well as more visits since she will be returning per protocol.  May not be feasible if receiving care in 3 different states.     Genetics.   -- Consented for HARMONY trial - however original tumor destroyed, insufficient 2018 sample - may consider biopsy at next evidence PD.  Also consider AURORA at next PD.  -- FM1 testing (liquid and bx test from 2018) showed BRCA2 mutation + PIK3CA    Dermatology. Skin damage with a plan for topical fluorouracil. Within reason, should not interfere with her breast cancer therapy.     Diarrhea. Usually has 4-6 episodes daily. Has tried changing her diet, without relief. Managed on Imodium. Is affecting QoL some days due to frequency of BMs.   -- Started Colestipol 01/04/20     Bilateral leg weakness, worse on left. Sometimes has difficulty standing due to weakness. Walking straight is fine.   -- Wants referral to get PT with Olea Defor in [place]. She will send [place] more information if referral or Rx is required.    Disposition:   -- q51m Faslodex + abemaciclb + Xgeva, q55m CT/BS; next scans due 12/21 + provider visit  -- Will be in [place] during November; following with oncologist there until December   --Encouraged to follow up with PT while in [place]; will provider referral for PT in [place] as well when patient returns  ------------------------------------   Interval Medical/ROS:  Susan Robbins presents unaccompanied for consideration of C13 fulvestrant + abemaciclib. She reports she is doing well overall and has great energy level. She notes she recently had a bad sinus infection, which resolved after three different antibiotics. She reports she's also received COVID-19 booster vaccine. She states she is still having persistent weakness in bilateral legs, worse to left, which she reports may be due to neuropathy. She also reports 20 year history of left sided weakness, which is unchanged. Susan Robbins states she sometimes feels unsteady on her feet due to symptoms. She requests a referral to PT.  She will be in [place] for the next month and has previously seen a PT there whom she will reach out to and schedule with during that time.  She notes she has mild nausea as well. She reports she has continued diarrhea, which has not worsened and is managed on Imodium. She also states she bruises easily but it is not impacting quality of life. Denies any new breast related complaints.   -- Full 12 ROS reviewed and mild/none.     Labs (02/01/20): CMP ok. CBC with stable leukopenia (WBC=3.6) and anemia (HGB=11.7).     Tumor marker:  Ca27.29 170 (08/26/18)--> 313.1 (09/27/18) --> 350.7 (09811) --> 419.8 (11/10/18) --> 376.1 (11/24/18) -- > 280.2 (12/08/18) --> 154.9 (01/05/19) --> 114.5 (02/02/19) --> 91.4 (03/25/19) --> 61 (09/14/19) --> 65 (09/28/19) --> 66.1 (11/09/19) --> 72.4 (12/07/19) --> 71.5 (12/29/19) --> 74.0 (02/01/20)     Staging Scans:  CT C/A/P and NM bone scan (12/30/19): stable.      Soc Hx: Has seen Dr. Chalmers Cater in [place] in past, will be going back to the [place] soon and will see Dr. Wyatt Portela for treatment while in [place]. Lives mostly in [place] - sees PCP Dr. [name] [phone] and has local oncologist to get injections + labs + scans. Married 45+ years. Son recently got engaged at the [facility] and is currently helping with wedding planning for wedding in February 2022. She has a website [email or web address] where she shares her cancer story to help others in her situation.     History of present illness  Susan Robbins is a 69 y.o. female who presents for recommendations regarding the management of her breast cancer. I have reviewed her oncology history, which is summarized below.     Oncology History Overview Note   Seen at [facility]     Metastatic breast cancer (CMS-HCC)   06/1989 -  Other    Diagnosed with right breast DCIS     12/1997 Initial Diagnosis    Left BRCA-associated breast cancer - Stg II, 4 positive nodes, ER+. S/p bilateral mastectomy w/ recon     01/1998 - 05/1998 Chemotherapy    AC x 6 cycles and Taxol x 3 (stopped early d/t neuropathy). Dr. Cyndie Chime.     07/1998 - 08/1998 Radiation    Adjuvant radiation therapy      09/1998 Endocrine/Hormone Therapy    Started on Tamoxifen, completed 5 years 09/2003. Unable to tolerate AI      Other    BRCA testing - BRCA 2 mutation. Had hysterectomy and bilateral salpingo-oophorectomy in May 2017. Undergone EUS and seen by GI     11/2016 -  Other    See  in [facility] for assessment of ? GI bleed prompting imaging - found to not be blood (beets) but imaging found evidence disease recurrence     11/30/2016 Interval Scan(s)    CT c/a/p -c/w peritoneal carcinomatosis with peritoneal thickening and enhancement as well as mild ascites. Vague suspicious liver hypodensities. PET 9/11:  Several small hypermetabolic osseous metastases scattered in the thoracolumbar spine.  2. Widespread hypermetabolic peritoneal carcinomatosis throughout the peritoneal cavity as detailed. Small volume pelvic ascites.  3. Hypermetabolic left adrenal metastasis.  4. Hypermetabolic mediastinal nodal metastases.  5. Hypermetabolic inferior left axillary soft tissue mass adjacent  to surgical clips, compatible with soft tissue metastasis.  6. Small dependent right pleural effusion.     12/02/2016 Biopsy    Peritoneal soft tissue biopsy - metastatic  lobular carcinoma. ER 100%, PR 10%, and HER2 negative (1+)     12/10/2016 Endocrine/Hormone Therapy    Letrozole / palbociclib initiated (palbo started 01/2017). Dose reduced palbo to 100mg  for persistent grade 3 neutropenia 05/2017, then palbo discontinued altogether 07/2017 d/t MSK sx and pt QoL.      12/30/2016 Genetics      Results revealed patient has the following mutation(s): Somatic tumor testing by FN1:  BRCA2 positive   PIK3CA [id number]  CCND1 amplification  FGFR1 amplification  CDH1 V13fs'36  FGF19 amplification  FGF3 amplification  FGF4 amplification  MGL1 amplificiation-equivocal   NSD3 (WHSC169)amplification  NTRK1 amplificiation- equivocal  TP53 V157G-subclonal  [id number] amplification      03/2017 -  Research Study Participant    HARMONY - original 1999 tumor destroyed. Met inadequate / used up       10/13/2018 -  Chemotherapy    OP BREAST FULVESTRANT / abemaciclib         Past Medical History:   Diagnosis Date   ??? BRCA positive     germline BRCA2   ??? Breast cancer (CMS-HCC) 1999   ??? Disease of thyroid gland     hypothyroidism   ??? DVT (deep venous thrombosis) (CMS-HCC) 1973    thought provoked by accident.    ??? Hypertension    ??? Osteoporosis     on Prolia until metastatic breast cancer diagnosis     Gyn History: Two children. Menopause age 59 with chemotherapy.     Past Surgical History:   Procedure Laterality Date   ??? HYSTERECTOMY     ??? MASTECTOMY Bilateral 1999     Current Outpatient Medications   Medication Sig Dispense Refill   ??? abemaciclib (VERZENIO) 150 mg Tab tablet Take 1 tablet (150 mg total) by mouth two (2) times a day with or without food. 56 tablet 11   ??? azithromycin (AZASITE) 1 % ophthalmic solution Azasite 1 % eye drops   PALCE 1 DROP IN BOTH EYES AS DIRECTED     ??? calcium carbonate (TUMS) 200 mg calcium (500 mg) chewable tablet Tums     ??? cholecalciferol, vitamin D3, 2,000 unit cap Vitamin D3 2,000 unit capsule   Take by oral route.     ??? clobetasol (TEMOVATE) 0.05 % external solution clobetasol 0.05 % scalp solution   1 APPLICATION APPLY ON THE SKIN AT BEDTIME APPLY AT BEDTIME TO SPOT ON SCALP FOR 2 WEEKS     ??? colestipoL (COLESTID) 1 gram tablet Take 1 tablet (1 g total) by mouth Two (2) times a day. 180 tablet 1   ??? cyclobenzaprine (FLEXERIL) 5 MG tablet Take 1 tablet (5 mg total) by mouth Three (3) times a day as needed for muscle spasms. 60 tablet 0   ??? docusate sodium (COLACE) 100 MG capsule Take 100 mg by mouth two (2) times a day as needed.     ??? ergocalciferol, vitamin D2, (VITAMIN D2 ORAL) Take 50,000 Units by mouth every seven (7) days.     ??? hydrocortisone 1 % cream Apply topically daily as needed.     ??? hyoscyamine sulfate (LEVSIN) 0.125 mg tablet Place 0.125 mg under the tongue daily as needed.     ??? ibuprofen (ADVIL,MOTRIN) 200 MG tablet Take 200 mg by mouth every six (6) hours as needed.     ??? Lactobacillus acidophilus (PROBIOTIC ORAL) Take by mouth. Takes it on/off     ??? Lactobacillus acidophilus 10 billion cell cap Probiotic     ???  levothyroxine (SYNTHROID, LEVOTHROID) 75 MCG tablet Take 75 mcg by mouth daily.     ??? loperamide (IMODIUM A-D) 2 mg tablet Takes 2 tabs with first loose stool and then 1 tablet with each subsequent loose stool 30 tablet 5   ??? ondansetron (ZOFRAN) 8 MG tablet Take 1 tablet (8 mg total) by mouth every eight (8) hours as needed for nausea. 30 tablet 2   ??? pantoprazole (PROTONIX) 40 MG tablet Take 40 mg by mouth daily as needed.     ??? triamcinolone (KENALOG) 0.1 % lotion Apply topically Three (3) times a day.     ??? valACYclovir (VALTREX) 500 MG tablet two (2) times a day as needed.       No current facility-administered medications for this visit.     Facility-Administered Medications Ordered in Other Visits   Medication Dose Route Frequency Provider Last Rate Last Admin   ??? denosumab (XGEVA) 120 mg/1.7 mL (70 mg/mL) injection            ??? denosumab (XGEVA) 120 mg/1.7 mL (70 mg/mL) injection            ??? denosumab (XGEVA) 120 mg/1.7 mL (70 mg/mL) injection            ??? fulvestrant (FASLODEX) injection 500 mg  500 mg Intramuscular Once Joylene John, MD         Allergies   Allergen Reactions   ??? Berry Flavor Nausea And Vomiting     All berries   ??? Heparin Analogues Hives     Hives in the past but has had heparin recently with  No problems   ??? Penicillins Other (See Comments)     Reaction unknown occurred while in college  Has patient had a PCN reaction causing immediate rash, facial/tongue/throat swelling, SOB or lightheadedness with hypotension: reaction unknown  Has patient had a PCN reaction causing severe rash involving mucus membranes or skin necrosis: reaction unknown  Has patient had a PCN reaction that required hospitalization no  Has patient had a PCN reaction occurring within the last 10 years: no  If all of the above answers are NO, then may proceed with Cep  Reaction unknown occurred while in college  Has patient had a PCN reaction causing immediate rash, facial/tongue/throat swelling, SOB or lightheadedness with hypotension: reaction unknown  Has patient had a PCN reaction causing severe rash involving mucus membranes or skin necrosis: reaction unknown  Has patient had a PCN reaction that required hospitalization no  Has patient had a PCN reaction occurring within the last 10 years: no  If all of the above answers are NO, then may proceed with Cep   ??? Oxycodone Nausea Only     Social History     Social History Narrative    Lives in [place] several months per year with husband [name], however primary residence is [place] (43m / yr, has an excellent PCP who has offered to help manage care while down there) and they spend up to 2 months in a family home in [place], [place].      Family History   Problem Relation Age of Onset   ??? Pancreatic cancer Father 28        died in 4 months    ??? BRCA 1/2 Sister         BRCA 2+   ??? BRCA 1/2 Brother         BRCA 2+ (2 of 7 brothers are positive)    ???  BRCA 1/2 Brother         BRCA 2+     Review of Systems: A 12-system review of systems was obtained including: Constitutional, Eyes, ENT, Cardiovascular, Respiratory, GI, GU, Musculoskeletal, Skin, Neurological, Psychiatric, Endocrine, Heme/Lymphatic, and Allergic/Immunologic systems. It is negative or non-contributory to the patient???s management except as noted in the history above. ROS performed over the phone.     Physical Examination:   Vitals:    02/01/20 1120   BP: 142/88   Pulse: 72   Resp: 18   Temp: 36.4 ??C (97.6 ??F)   SpO2: 97%      General:  Healthy-appearing female in no acute distress  Cardiovasc:  No heaves, regular, no additional sounds. No lower extremity edema  Respiratory:  Chest clear to percussion and auscultation, unlabored  Gastrointestinal:  Soft, nontender, no hepatomegaly. Normal bowel sounds. Nodule deep into the abdomen to the left and superior of umbilicus difficult to clearly palpate today (from prior exam: feels like might be in the abdominal musculature wall, indistinct, 3 x 3.5 cm, non tender)  Musculoskeletal:  No bony pain or tenderness  Skin and Subcutaneous Tissues: On previous exam: patchy skin lesions scattered on her face, chronic. Limited exam today due to mask in place.  Psychiatric: Mood is normal.  No other symptoms.   Neuro:  Alert and oriented, no focal neurologic deficits  Upper Extremity Lymphedema: None  Breast/Chest: bilateral mastectomy without CW disease  Heme/Lymphatic/Immunologic: B/l axilla and supraclav ok. - unchanged exam today    I have personally reviewed the following diagnostic studies:    Breast imaging and pathology reviewed. See Results for details  ______________________________________________________________________    Documentation assistance was provided by Norwood Levo, Scribe, on February 01, 2020 at 11:30 AM for [facility].     ----------------------------------------------------------------------------------------------------------------------  February 02, 2020 8:28 AM. Documentation assistance provided by the Scribe. I was present during the time the encounter was recorded. The information recorded by the Scribe was done at my direction and has been reviewed and validated by me.  ----------------------------------------------------------------------------------------------------------------------  I personally spent 36 minutes face-to-face and non-face-to-face in the care of this patient, which includes all pre, intra, and post visit time on the date of service.

## 2020-02-01 ENCOUNTER — Ambulatory Visit: Admit: 2020-02-01 | Discharge: 2020-02-01 | Payer: MEDICARE

## 2020-02-01 ENCOUNTER — Encounter: Admit: 2020-02-01 | Discharge: 2020-02-01 | Payer: MEDICARE

## 2020-02-01 ENCOUNTER — Institutional Professional Consult (permissible substitution): Admit: 2020-02-01 | Discharge: 2020-02-01 | Payer: MEDICARE

## 2020-02-01 ENCOUNTER — Other Ambulatory Visit: Admit: 2020-02-01 | Discharge: 2020-02-01 | Payer: MEDICARE

## 2020-02-01 DIAGNOSIS — C50919 Malignant neoplasm of unspecified site of unspecified female breast: Principal | ICD-10-CM

## 2020-02-01 LAB — CBC W/ AUTO DIFF
BASOPHILS ABSOLUTE COUNT: 0.1 10*9/L (ref 0.0–0.1)
BASOPHILS RELATIVE PERCENT: 1.4 %
EOSINOPHILS ABSOLUTE COUNT: 0.1 10*9/L (ref 0.0–0.4)
EOSINOPHILS RELATIVE PERCENT: 3 %
HEMATOCRIT: 33.9 % — ABNORMAL LOW (ref 36.0–46.0)
HEMOGLOBIN: 11.7 g/dL — ABNORMAL LOW (ref 12.0–16.0)
LYMPHOCYTES ABSOLUTE COUNT: 0.8 10*9/L — ABNORMAL LOW (ref 1.5–5.0)
LYMPHOCYTES RELATIVE PERCENT: 21.9 %
MEAN CORPUSCULAR HEMOGLOBIN CONC: 34.6 g/dL (ref 31.0–37.0)
MEAN CORPUSCULAR HEMOGLOBIN: 34.1 pg — ABNORMAL HIGH (ref 26.0–34.0)
MEAN CORPUSCULAR VOLUME: 98.5 fL (ref 80.0–100.0)
MEAN PLATELET VOLUME: 7.3 fL (ref 7.0–10.0)
MONOCYTES ABSOLUTE COUNT: 0.2 10*9/L (ref 0.2–0.8)
NEUTROPHILS ABSOLUTE COUNT: 2.3 10*9/L (ref 2.0–7.5)
NEUTROPHILS RELATIVE PERCENT: 64 %
PLATELET COUNT: 227 10*9/L (ref 150–440)
RED BLOOD CELL COUNT: 3.44 10*12/L — ABNORMAL LOW (ref 4.00–5.20)
RED CELL DISTRIBUTION WIDTH: 13.8 % (ref 12.0–15.0)
WBC ADJUSTED: 3.6 10*9/L — ABNORMAL LOW (ref 4.5–11.0)

## 2020-02-01 LAB — COMPREHENSIVE METABOLIC PANEL
ALBUMIN: 3.4 g/dL (ref 3.4–5.0)
ALT (SGPT): 15 U/L (ref 10–49)
ANION GAP: 4 mmol/L — ABNORMAL LOW (ref 5–14)
AST (SGOT): 20 U/L (ref <=34)
BILIRUBIN TOTAL: 0.6 mg/dL (ref 0.3–1.2)
BLOOD UREA NITROGEN: 19 mg/dL (ref 9–23)
BUN / CREAT RATIO: 20
CALCIUM: 9.4 mg/dL (ref 8.7–10.4)
CHLORIDE: 103 mmol/L (ref 98–107)
CO2: 29 mmol/L (ref 20.0–31.0)
CREATININE: 0.95 mg/dL
EGFR CKD-EPI AA FEMALE: 70 mL/min/{1.73_m2} (ref >=60)
EGFR CKD-EPI NON-AA FEMALE: 60 mL/min/{1.73_m2} (ref >=60)
GLUCOSE RANDOM: 92 mg/dL (ref 70–179)
POTASSIUM: 4.2 mmol/L (ref 3.5–5.1)
PROTEIN TOTAL: 6.7 g/dL (ref 5.7–8.2)
SODIUM: 136 mmol/L (ref 135–145)

## 2020-02-01 LAB — PHOSPHORUS: Phosphate:MCnc:Pt:Ser/Plas:Qn:: 3.9

## 2020-02-01 LAB — EOSINOPHILS RELATIVE PERCENT: Eosinophils/100 leukocytes:NFr:Pt:Bld:Qn:Automated count: 3

## 2020-02-01 LAB — CA 27-29: Lab: 74 — ABNORMAL HIGH

## 2020-02-01 LAB — PROTEIN TOTAL: Protein:MCnc:Pt:Ser/Plas:Qn:: 6.7

## 2020-02-01 MED ADMIN — denosumab (XGEVA) injection 120 mg: 120 mg | SUBCUTANEOUS | @ 16:00:00 | Stop: 2020-02-01

## 2020-02-01 MED ADMIN — fulvestrant (FASLODEX) injection 500 mg: 500 mg | INTRAMUSCULAR | @ 16:00:00 | Stop: 2020-02-01

## 2020-02-01 NOTE — Unmapped (Signed)
Reviewed labs.Patient received Xgeva in Left arm per patient's request and Fulvestrant 500mg  in bilateral ventro gluteal.. Tolerated well. Gauze, bandaid applied.

## 2020-02-01 NOTE — Unmapped (Addendum)
It was a pleasure to see you today in the Breast Medical Oncology Clinic.      Start physical therapy for leg weakness.     For clinical concerns during working hours, please call [phone]. My nurse navigators are Lucendia Herrlich, RN and Modena Jansky, RN.    For clinical trial questions please call the study coordinator.     For emergencies, evenings or weekends, please call [phone] and ask for the oncology fellow on call.  Reasons to call the emergency line may include:  - Fever of 100.5 or greater  - Nausea and/or vomiting not relieved with nausea medicine  - Diarrhea or constipation not relieved with bowel regimen  - Severe pain not relieved with usual pain regimen    ______________________________________________________________    [facility] on COVID-19    We are facing a challenge we have never seen before with COVID-19, the new coronavirus making its way around the world and the [place] Please be assured we are focused on providing you and your loved ones with the best possible cancer care and new treatments in the safest way possible.    There are several changes to our day-to-day activities at the [facility], the clinical home of [facility].  We made two videos to help describe the ways we are working to keep you safe, such as offering the option to visit your care team over the phone or through a video, as well as support services we continue to offer to our patients and their caregivers.    Video #1: Keeping [facility] patients safe during the COVID-19 crisis  [email or web address]     Video #2: Support for cancer patients and their caregivers during the COVID-19 pandemic  [email or web address]    We are dedicated to providing you the best care and support ??? and in the safest manner possible ??? during your cancer journey.  If you have any questions about your cancer care, please call your care team.

## 2020-02-06 DIAGNOSIS — C50812 Malignant neoplasm of overlapping sites of left female breast: Principal | ICD-10-CM

## 2020-02-06 DIAGNOSIS — C50919 Malignant neoplasm of unspecified site of unspecified female breast: Principal | ICD-10-CM

## 2020-02-06 DIAGNOSIS — Z170 Estrogen receptor positive status [ER+]: Principal | ICD-10-CM

## 2020-02-20 DIAGNOSIS — C50919 Malignant neoplasm of unspecified site of unspecified female breast: Principal | ICD-10-CM

## 2020-02-20 MED ORDER — ABEMACICLIB 150 MG TABLET
ORAL_TABLET | Freq: Two times a day (BID) | ORAL | 11 refills | 28 days | Status: CP
Start: 2020-02-20 — End: ?

## 2020-02-21 DIAGNOSIS — C50919 Malignant neoplasm of unspecified site of unspecified female breast: Principal | ICD-10-CM

## 2020-02-24 DIAGNOSIS — C50919 Malignant neoplasm of unspecified site of unspecified female breast: Principal | ICD-10-CM

## 2020-03-15 DIAGNOSIS — C50919 Malignant neoplasm of unspecified site of unspecified female breast: Principal | ICD-10-CM

## 2020-03-15 NOTE — Unmapped (Signed)
Pharmacist Phone Visit Follow-Up    Cancer Team  Medical Oncology: Dr. Iona Hansen  Reason for call : Oral chemotherapy management  Current treatment: Abemaciclib + fulvestrant + Xgeva     Assessment/Plan  Metastatic BRCA2-associated HR+ HER2- breast cancer  Susan Robbins is a 69 yo woman with metastatic breast cancer currently on fulvestrant, abemaciclib and Xgeva.  She has abemaciclib-induced diarrhea that she has learned to manage and tolerate with diet and Imodium.  Patient initiated Pepto Bismol 2 tabs mid-day three days ago and reports that for these days she has had only one normal stool each day.  Recommended that she try one tablet daily to determine if still beneficial.  Patient to let me know how she is doing.        ________________________________________________________________________     Interval History   Susan Robbins is a 69 y.o. female with metastatic breast cancer on abemacilcib and fulvestrant since July 2020.  She has learned to manage abemaciclib-induced diarrhea with Imodium and tolerates having several loose stools each day.  Her son recommended that she try Pepto Bismol to control her diarrhea since that worked for him.  For three days she has taken 2 Pepto Bismol tablets mid-day and a dose of Imodium in the evening.  Surprisingly, for the past 3 days she has had only one normal stool each day.  She has even tried eating foods that normally would cause diarrhea and she was able to tolerate them without GI upset.      I reviewed potential adverse effects of Pepto Bismol including darkened tongue and stools as well as the risk of tinnitus.  I recommended that she try to decrease her daily Pepto Bismol to one tablet to see if she gets the same benefit.    Oral chemotherapy regimen: Abemaciclib??150 mg PO twice daily with fulvestrant  Specialty Pharmacy:  [facility] specialty pharmacy  Start Date: 10/13/18      Adherence: No missed doses    Drug interactions: Pepto Bismol is not expected to interact with abemaciclib.    Patient verbalized understanding of the above information.     I spent 10 minutes on the phone with the patient on the date of service. I spent an additional 10 minutes on pre- and post-visit activities.     The patient was physically located in [place] or a state in which I am permitted to provide care. The patient and/or parent/guardian understood that s/he may incur co-pays and cost sharing, and agreed to the telemedicine visit. The visit was reasonable and appropriate under the circumstances given the patient's presentation at the time.    The patient and/or parent/guardian has been advised of the potential risks and limitations of this mode of treatment (including, but not limited to, the absence of in-person examination) and has agreed to be treated using telemedicine. The patient's/patient's family's questions regarding telemedicine have been answered.     If the visit was completed in an ambulatory setting, the patient and/or parent/guardian has also been advised to contact their provider???s office for worsening conditions, and seek emergency medical treatment and/or call 911 if the patient deems either necessary.      Oncology History Overview Note   Seen at [facility]     Metastatic breast cancer (CMS-HCC)   06/1989 -  Other    Diagnosed with right breast DCIS     12/1997 Initial Diagnosis    Left BRCA-associated breast cancer - Stg II, 4 positive nodes, ER+. S/p bilateral mastectomy w/ recon  01/1998 - 05/1998 Chemotherapy    AC x 6 cycles and Taxol x 3 (stopped early d/t neuropathy). Dr. Cyndie Chime.     07/1998 - 08/1998 Radiation    Adjuvant radiation therapy      09/1998 Endocrine/Hormone Therapy    Started on Tamoxifen, completed 5 years 09/2003. Unable to tolerate AI      Other    BRCA testing - BRCA 2 mutation. Had hysterectomy and bilateral salpingo-oophorectomy in May 2017. Undergone EUS and seen by GI     11/2016 -  Other    See  in [facility] for assessment of ? GI bleed prompting imaging - found to not be blood (beets) but imaging found evidence disease recurrence     11/30/2016 Interval Scan(s)    CT c/a/p -c/w peritoneal carcinomatosis with peritoneal thickening and enhancement as well as mild ascites. Vague suspicious liver hypodensities. PET 9/11:  Several small hypermetabolic osseous metastases scattered in the thoracolumbar spine.  2. Widespread hypermetabolic peritoneal carcinomatosis throughout the peritoneal cavity as detailed. Small volume pelvic ascites.  3. Hypermetabolic left adrenal metastasis.  4. Hypermetabolic mediastinal nodal metastases.  5. Hypermetabolic inferior left axillary soft tissue mass adjacent  to surgical clips, compatible with soft tissue metastasis.  6. Small dependent right pleural effusion.     12/02/2016 Biopsy    Peritoneal soft tissue biopsy - metastatic lobular carcinoma. ER 100%, PR 10%, and HER2 negative (1+)     12/10/2016 Endocrine/Hormone Therapy    Letrozole / palbociclib initiated (palbo started 01/2017). Dose reduced palbo to 100mg  for persistent grade 3 neutropenia 05/2017, then palbo discontinued altogether 07/2017 d/t MSK sx and pt QoL.      12/30/2016 Genetics      Results revealed patient has the following mutation(s): Somatic tumor testing by [facility]:  BRCA2 positive   PIK3CA [id number]  CCND1 amplification  FGFR1 amplification  CDH1 V151fs'36  FGF19 amplification  FGF3 amplification  FGF4 amplification  MGL1 amplificiation-equivocal   NSD3 (WHSC169)amplification  NTRK1 amplificiation- equivocal  TP53 V157G-subclonal  ZNF703 amplification      03/2017 -  Research Study Participant    HARMONY - original 1999 tumor destroyed. Met inadequate / used up       10/13/2018 -  Chemotherapy    OP BREAST FULVESTRANT / abemaciclib            Pertinent Labs:   No visits with results within 1 Day(s) from this visit.   Latest known visit with results is:   Ancillary Orders on 02/01/2020   Component Date Value Ref Range Status   ??? Sodium 02/01/2020 136  135 - 145 mmol/L Final   ??? Potassium 02/01/2020 4.2  3.5 - 5.1 mmol/L Final   ??? Chloride 02/01/2020 103  98 - 107 mmol/L Final   ??? Anion Gap 02/01/2020 4* 5 - 14 mmol/L Final   ??? CO2 02/01/2020 29.0  20.0 - 31.0 mmol/L Final   ??? BUN 02/01/2020 19  9 - 23 mg/dL Final   ??? Creatinine 02/01/2020 0.95  0.55 - 1.02 mg/dL Final   ??? BUN/Creatinine Ratio 02/01/2020 20   Final   ??? EGFR CKD-EPI Non-African American,* 02/01/2020 60  >=60 mL/min/1.33m2 Final   ??? EGFR CKD-EPI African American, Fem* 02/01/2020 70  >=60 mL/min/1.14m2 Final   ??? Glucose 02/01/2020 92  70 - 179 mg/dL Final   ??? Calcium 16/01/9603 9.4  8.7 - 10.4 mg/dL Final   ??? Albumin 54/12/8117 3.4  3.4 - 5.0 g/dL Final   ???  Total Protein 02/01/2020 6.7  5.7 - 8.2 g/dL Final   ??? Total Bilirubin 02/01/2020 0.6  0.3 - 1.2 mg/dL Final   ??? AST 29/56/2130 20  <=34 U/L Final   ??? ALT 02/01/2020 15  10 - 49 U/L Final   ??? Alkaline Phosphatase 02/01/2020 49  46 - 116 U/L Final   ??? WBC 02/01/2020 3.6* 4.5 - 11.0 10*9/L Final   ??? RBC 02/01/2020 3.44* 4.00 - 5.20 10*12/L Final   ??? HGB 02/01/2020 11.7* 12.0 - 16.0 g/dL Final   ??? HCT 86/57/8469 33.9* 36.0 - 46.0 % Final   ??? MCV 02/01/2020 98.5  80.0 - 100.0 fL Final   ??? MCH 02/01/2020 34.1* 26.0 - 34.0 pg Final   ??? MCHC 02/01/2020 34.6  31.0 - 37.0 g/dL Final   ??? RDW 62/95/2841 13.8  12.0 - 15.0 % Final   ??? MPV 02/01/2020 7.3  7.0 - 10.0 fL Final   ??? Platelet 02/01/2020 227  150 - 440 10*9/L Final   ??? Neutrophils % 02/01/2020 64.0  % Final   ??? Lymphocytes % 02/01/2020 21.9  % Final   ??? Monocytes % 02/01/2020 6.6  % Final   ??? Eosinophils % 02/01/2020 3.0  % Final   ??? Basophils % 02/01/2020 1.4  % Final   ??? Absolute Neutrophils 02/01/2020 2.3  2.0 - 7.5 10*9/L Final   ??? Absolute Lymphocytes 02/01/2020 0.8* 1.5 - 5.0 10*9/L Final   ??? Absolute Monocytes 02/01/2020 0.2  0.2 - 0.8 10*9/L Final   ??? Absolute Eosinophils 02/01/2020 0.1  0.0 - 0.4 10*9/L Final   ??? Absolute Basophils 02/01/2020 0.1  0.0 - 0.1 10*9/L Final   ??? Large Unstained Cells 02/01/2020 3  0 - 4 % Final   ??? Macrocytosis 02/01/2020 Slight* Not Present Final       Current medications:   Current Outpatient Medications   Medication Sig Dispense Refill   ??? abemaciclib (VERZENIO) 150 mg Tab tablet Take 1 tablet (150 mg total) by mouth two (2) times a day with or without food. 56 tablet 11   ??? azithromycin (AZASITE) 1 % ophthalmic solution Azasite 1 % eye drops   PALCE 1 DROP IN BOTH EYES AS DIRECTED     ??? calcium carbonate (TUMS) 200 mg calcium (500 mg) chewable tablet Tums     ??? cholecalciferol, vitamin D3, 2,000 unit cap Vitamin D3 2,000 unit capsule   Take by oral route.     ??? clobetasol (TEMOVATE) 0.05 % external solution clobetasol 0.05 % scalp solution   1 APPLICATION APPLY ON THE SKIN AT BEDTIME APPLY AT BEDTIME TO SPOT ON SCALP FOR 2 WEEKS     ??? colestipoL (COLESTID) 1 gram tablet Take 1 tablet (1 g total) by mouth Two (2) times a day. 180 tablet 1   ??? cyclobenzaprine (FLEXERIL) 5 MG tablet Take 1 tablet (5 mg total) by mouth Three (3) times a day as needed for muscle spasms. 60 tablet 0   ??? docusate sodium (COLACE) 100 MG capsule Take 100 mg by mouth two (2) times a day as needed.     ??? ergocalciferol, vitamin D2, (VITAMIN D2 ORAL) Take 50,000 Units by mouth every seven (7) days.     ??? hydrocortisone 1 % cream Apply topically daily as needed.     ??? hyoscyamine sulfate (LEVSIN) 0.125 mg tablet Place 0.125 mg under the tongue daily as needed.     ??? ibuprofen (ADVIL,MOTRIN) 200 MG tablet Take 200 mg by  mouth every six (6) hours as needed.     ??? Lactobacillus acidophilus (PROBIOTIC ORAL) Take by mouth. Takes it on/off     ??? Lactobacillus acidophilus 10 billion cell cap Probiotic     ??? levothyroxine (SYNTHROID, LEVOTHROID) 75 MCG tablet Take 75 mcg by mouth daily.     ??? loperamide (IMODIUM A-D) 2 mg tablet Takes 2 tabs with first loose stool and then 1 tablet with each subsequent loose stool 30 tablet 5   ??? ondansetron (ZOFRAN) 8 MG tablet Take 1 tablet (8 mg total) by mouth every eight (8) hours as needed for nausea. 30 tablet 2   ??? pantoprazole (PROTONIX) 40 MG tablet Take 40 mg by mouth daily as needed.     ??? triamcinolone (KENALOG) 0.1 % lotion Apply topically Three (3) times a day.     ??? valACYclovir (VALTREX) 500 MG tablet two (2) times a day as needed.       No current facility-administered medications for this visit.     Facility-Administered Medications Ordered in Other Visits   Medication Dose Route Frequency Provider Last Rate Last Admin   ??? denosumab (XGEVA) 120 mg/1.7 mL (70 mg/mL) injection            ??? denosumab (XGEVA) 120 mg/1.7 mL (70 mg/mL) injection            ??? denosumab (XGEVA) 120 mg/1.7 mL (70 mg/mL) injection

## 2020-03-21 ENCOUNTER — Ambulatory Visit: Admit: 2020-03-21 | Discharge: 2020-04-03 | Payer: MEDICARE

## 2020-03-21 DIAGNOSIS — C787 Secondary malignant neoplasm of liver and intrahepatic bile duct: Principal | ICD-10-CM

## 2020-03-21 DIAGNOSIS — C50812 Malignant neoplasm of overlapping sites of left female breast: Principal | ICD-10-CM

## 2020-03-21 DIAGNOSIS — Z170 Estrogen receptor positive status [ER+]: Principal | ICD-10-CM

## 2020-03-21 DIAGNOSIS — C50919 Malignant neoplasm of unspecified site of unspecified female breast: Principal | ICD-10-CM

## 2020-03-21 MED ADMIN — Technetium Tc-99m Oxidronate HDP: 19.9 | INTRAVENOUS | @ 14:00:00 | Stop: 2020-03-21

## 2020-03-21 MED ADMIN — iohexoL (OMNIPAQUE) 350 mg iodine/mL solution 100 mL: 100 mL | INTRAVENOUS | @ 15:00:00 | Stop: 2020-03-21

## 2020-03-24 NOTE — Unmapped (Signed)
Breast Cancer Return Patient Evaluation    PCP: [name], MD   PCP [facility]): Dr. [name] [phone] (fax # [phone])  [facility]: Chalmers Cater, MD ([facility], [place]) - send notes     Reason for Visit: A 69 y.o. 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  See OncHistory for details. Letrozole / palbociclib 12/2016 with DR d/t neutropenia then discontinued 07/2017 d/t QoL issues. Continued on letrozole alone, discontinued 09/2018 d/t progression in liver and peritoneum, initiated fulvestrant + abemaciclib 10/13/18.     She still spends part of the year in [place], [place], in [place], or in [place], [place]. Has oncologists in each of these places (Drs. Nilda Riggs and Wyatt Portela).     Systemic Therapy:  -- Continue fulvestrant + abemaciclib, same doses  -- Continue denosumab monthly  -- Recent restaging CT/BS stable. New tiny hypodense lesion at dome measuring 0.4 cm will CTM. She gets q29m scans     Future Options:   1. Olaparib or talazoparib (gBRCA2 mutation)   2. Alpelisib + ET (PIK3CA mutation)   3. Clinical trial: olaparib + immunotherapy. She has a HR+ cancer which tends not to be immune activated; the trial builds upon data that PARPi can induce immune activation / infiltration that might synergize with ICI. This would require biopsies of the liver lesions as well as more visits since she will be returning per protocol.  May not be feasible if receiving care in 3 different states.     Genetics.   -- Consented for HARMONY trial - however original tumor destroyed, insufficient 2018 sample - may consider biopsy at next evidence PD.  Also consider AURORA at next PD.  -- FM1 testing (liquid and bx test from 2018) showed BRCA2 mutation + PIK3CA    Dermatology. Skin damage with a plan for topical fluorouracil. Within reason, should not interfere with her breast cancer therapy.     Diarrhea. Usually has 4-6 episodes daily. Has tried changing her diet, without relief. Managed on Imodium. Is affecting QoL some days due to frequency of BMs.   -- Started Colestipol PRN 01/04/20 .   --03/28/20: reports overall, diarrhea is better.    Bilateral leg weakness, worse on left. Sometimes has difficulty standing due to weakness. Walking straight is fine.   -- Wants referral to get PT with Olea Defor in [place]. Referral was sent and will see her when she is in [place].    Disposition:   -- q43m Faslodex + abemaciclb + Xgeva, q48m CT/BS; next scan due 06/2020 with provider visit   ------------------------------------   Interval Medical/ROS:  Ms. Sarchet presents unaccompanied for consideration of C15 fulvestrant + abemaciclib. Restaging scans 03/21/20 were stable besides a new tiny hypodense lesion at the dome measuring 0.4 cm. Since our last visit on 02/01/20, patient was diagnosed with a MRSA infection which began a small area on her right lower leg and then spread to her entire face the day after she had a laser facial treatment done.  She reports following this treatment she had facial swelling and trouble swallowing. She notes she was treated for MRSA infection and is now feeling much improved. She reports she also had a facial CT at this time to assess for metastases, which was negative. Reports Hgb was down to 9.7 during this time (was in [place] for all of this).   --  Doing well today.    -- Mild hair loss.   -- No new breast related complaints.   -- Full 12 ROS reviewed and otherwise mild/none.     Labs (03/28/20): CMP with elevated creatinine (1.01) CBC with mild anemia (HGB=10.7). Mild neutropenia (ANC=1.9 and WBC=2.9).     Tumor marker:  Ca27.29 170 (08/26/18)--> 313.1 (09/27/18) --> 350.7 (16109) --> 419.8 (11/10/18) --> 376.1 (11/24/18) -- > 280.2 (12/08/18) --> 154.9 (01/05/19) --> 114.5 (02/02/19) --> 91.4 (03/25/19) --> 61 (09/14/19) --> 65 (09/28/19) --> 66.1 (11/09/19) --> 72.4 (12/07/19) --> 71.5 (12/29/19) --> 74.0 (02/01/20) --> 75.7 (03/28/20)    Staging Scans:  CT C/A/P and NM bone scan (03/21/20): stable. New tiny hypodense lesion at the dome measuring 0.4 cm.     Soc Hx: Has seen Dr. Chalmers Cater in [place] in past, will be going back to the [place] soon and will see Dr. Wyatt Portela for treatment while in [place]. Lives mostly in [place] - sees PCP Dr. [name] [phone] and has local oncologist to get injections + labs + scans. Married 45+ years. Son recently got engaged at the [facility] and is currently helping with wedding planning for wedding in February 2022. She has a website [email or web address] where she shares her cancer story to help others in her situation. Moved sister-in-law into nursing home.     History of present illness  Susan Robbins is a 69 y.o. female who presents for recommendations regarding the management of her breast cancer. I have reviewed her oncology history, which is summarized below.     Oncology History Overview Note   Seen at [facility]     Metastatic breast cancer (CMS-HCC)   06/1989 -  Other    Diagnosed with right breast DCIS     12/1997 Initial Diagnosis    Left BRCA-associated breast cancer - Stg II, 4 positive nodes, ER+. S/p bilateral mastectomy w/ recon     01/1998 - 05/1998 Chemotherapy    AC x 6 cycles and Taxol x 3 (stopped early d/t neuropathy). Dr. Cyndie Chime.     07/1998 - 08/1998 Radiation    Adjuvant radiation therapy      09/1998 Endocrine/Hormone Therapy    Started on Tamoxifen, completed 5 years 09/2003. Unable to tolerate AI      Other    BRCA testing - BRCA 2 mutation. Had hysterectomy and bilateral salpingo-oophorectomy in May 2017. Undergone EUS and seen by GI     11/2016 -  Other    See  in [facility] for assessment of ? GI bleed prompting imaging - found to not be blood (beets) but imaging found evidence disease recurrence     11/30/2016 Interval Scan(s)    CT c/a/p -c/w peritoneal carcinomatosis with peritoneal thickening and enhancement as well as mild ascites. Vague suspicious liver hypodensities. PET 9/11:  Several small hypermetabolic osseous metastases scattered in the thoracolumbar spine.  2. Widespread hypermetabolic peritoneal carcinomatosis throughout the peritoneal cavity as detailed. Small volume pelvic ascites.  3. Hypermetabolic left adrenal metastasis.  4. Hypermetabolic mediastinal nodal metastases.  5. Hypermetabolic inferior left axillary soft tissue mass adjacent  to surgical clips, compatible with soft tissue metastasis.  6. Small dependent right pleural effusion.     12/02/2016 Biopsy    Peritoneal soft tissue biopsy - metastatic lobular carcinoma. ER 100%, PR 10%, and HER2 negative (1+)     12/10/2016 Endocrine/Hormone Therapy    Letrozole / palbociclib initiated (palbo started 01/2017). Dose reduced palbo to 100mg   for persistent grade 3 neutropenia 05/2017, then palbo discontinued altogether 07/2017 d/t MSK sx and pt QoL.      12/30/2016 Genetics      Results revealed patient has the following mutation(s): Somatic tumor testing by FN1:  BRCA2 positive   PIK3CA [id number]  CCND1 amplification  FGFR1 amplification  CDH1 V140fs'36  FGF19 amplification  FGF3 amplification  FGF4 amplification  MGL1 amplificiation-equivocal   NSD3 (WHSC169)amplification  NTRK1 amplificiation- equivocal  TP53 V157G-subclonal  ZNF703 amplification      03/2017 -  Research Study Participant    HARMONY - original 1999 tumor destroyed. Met inadequate / used up       10/13/2018 -  Chemotherapy    OP BREAST FULVESTRANT / abemaciclib         Past Medical History:   Diagnosis Date   ??? BRCA positive     germline BRCA2   ??? Breast cancer (CMS-HCC) 1999   ??? Disease of thyroid gland     hypothyroidism   ??? DVT (deep venous thrombosis) (CMS-HCC) 1973    thought provoked by accident.    ??? Hypertension    ??? Osteoporosis     on Prolia until metastatic breast cancer diagnosis     Gyn History: Two children. Menopause age 71 with chemotherapy.     Past Surgical History:   Procedure Laterality Date   ??? HYSTERECTOMY     ??? MASTECTOMY Bilateral 1999     Current Outpatient Medications   Medication Sig Dispense Refill   ??? abemaciclib (VERZENIO) 150 mg Tab tablet Take 1 tablet (150 mg total) by mouth two (2) times a day with or without food. 56 tablet 11   ??? azithromycin (AZASITE) 1 % ophthalmic solution Azasite 1 % eye drops   PALCE 1 DROP IN BOTH EYES AS DIRECTED     ??? calcium carbonate (TUMS) 200 mg calcium (500 mg) chewable tablet Tums     ??? cholecalciferol, vitamin D3, 2,000 unit cap Vitamin D3 2,000 unit capsule   Take by oral route.     ??? clobetasol (TEMOVATE) 0.05 % external solution clobetasol 0.05 % scalp solution   1 APPLICATION APPLY ON THE SKIN AT BEDTIME APPLY AT BEDTIME TO SPOT ON SCALP FOR 2 WEEKS     ??? colestipoL (COLESTID) 1 gram tablet Take 1 tablet (1 g total) by mouth Two (2) times a day. 180 tablet 1   ??? cyclobenzaprine (FLEXERIL) 5 MG tablet Take 1 tablet (5 mg total) by mouth Three (3) times a day as needed for muscle spasms. 60 tablet 0   ??? docusate sodium (COLACE) 100 MG capsule Take 100 mg by mouth two (2) times a day as needed.     ??? ergocalciferol, vitamin D2, (VITAMIN D2 ORAL) Take 50,000 Units by mouth every seven (7) days.     ??? hydrocortisone 1 % cream Apply topically daily as needed.     ??? hyoscyamine sulfate (LEVSIN) 0.125 mg tablet Place 0.125 mg under the tongue daily as needed.     ??? ibuprofen (ADVIL,MOTRIN) 200 MG tablet Take 200 mg by mouth every six (6) hours as needed.     ??? Lactobacillus acidophilus (PROBIOTIC ORAL) Take by mouth. Takes it on/off     ??? Lactobacillus acidophilus 10 billion cell cap Probiotic     ??? levothyroxine (SYNTHROID, LEVOTHROID) 75 MCG tablet Take 75 mcg by mouth daily.     ??? loperamide (IMODIUM A-D) 2 mg tablet Takes 2 tabs with first loose stool and then  1 tablet with each subsequent loose stool 30 tablet 5   ??? ondansetron (ZOFRAN) 8 MG tablet Take 1 tablet (8 mg total) by mouth every eight (8) hours as needed for nausea. 30 tablet 2   ??? pantoprazole (PROTONIX) 40 MG tablet Take 40 mg by mouth daily as needed.     ??? triamcinolone (KENALOG) 0.1 % lotion Apply topically Three (3) times a day.     ??? valACYclovir (VALTREX) 500 MG tablet two (2) times a day as needed.       No current facility-administered medications for this visit.     Facility-Administered Medications Ordered in Other Visits   Medication Dose Route Frequency Provider Last Rate Last Admin   ??? denosumab (XGEVA) 120 mg/1.7 mL (70 mg/mL) injection            ??? denosumab (XGEVA) 120 mg/1.7 mL (70 mg/mL) injection            ??? denosumab (XGEVA) 120 mg/1.7 mL (70 mg/mL) injection              Allergies   Allergen Reactions   ??? Berry Flavor Nausea And Vomiting     All berries   ??? Heparin Analogues Hives     Hives in the past but has had heparin recently with  No problems   ??? Penicillins Other (See Comments)     Reaction unknown occurred while in college  Has patient had a PCN reaction causing immediate rash, facial/tongue/throat swelling, SOB or lightheadedness with hypotension: reaction unknown  Has patient had a PCN reaction causing severe rash involving mucus membranes or skin necrosis: reaction unknown  Has patient had a PCN reaction that required hospitalization no  Has patient had a PCN reaction occurring within the last 10 years: no  If all of the above answers are NO, then may proceed with Cep  Reaction unknown occurred while in college  Has patient had a PCN reaction causing immediate rash, facial/tongue/throat swelling, SOB or lightheadedness with hypotension: reaction unknown  Has patient had a PCN reaction causing severe rash involving mucus membranes or skin necrosis: reaction unknown  Has patient had a PCN reaction that required hospitalization no  Has patient had a PCN reaction occurring within the last 10 years: no  If all of the above answers are NO, then may proceed with Cep   ??? Oxycodone Nausea Only     Social History     Social History Narrative Lives in [place] several months per year with husband [name], however primary residence is [place] (62m / yr, has an excellent PCP who has offered to help manage care while down there) and they spend up to 2 months in a family home in [place], [place].      Family History   Problem Relation Age of Onset   ??? Pancreatic cancer Father 39        died in 4 months    ??? BRCA 1/2 Sister         BRCA 2+   ??? BRCA 1/2 Brother         BRCA 2+ (2 of 7 brothers are positive)    ??? BRCA 1/2 Brother         BRCA 2+     Review of Systems: A 12-system review of systems was obtained including: Constitutional, Eyes, ENT, Cardiovascular, Respiratory, GI, GU, Musculoskeletal, Skin, Neurological, Psychiatric, Endocrine, Heme/Lymphatic, and Allergic/Immunologic systems. It is negative or non-contributory to the patient???s management except as  noted in the history above. ROS performed over the phone.     Physical Examination:    Vitals:    03/28/20 1011   BP: 135/70   Pulse: 78   Resp: 18   Temp: 36.5 ??C (97.7 ??F)   SpO2: 100%      General:  Healthy-appearing female in no acute distress  Cardiovasc:  No heaves, regular, no additional sounds. No lower extremity edema  Respiratory:  Chest clear to percussion and auscultation, unlabored  Gastrointestinal:  Soft, nontender, no hepatomegaly. Normal bowel sounds. Nodule deep into the abdomen to the left and superior of umbilicus difficult to clearly palpate today (from prior exam: feels like might be in the abdominal musculature wall, indistinct, 3 x 3.5 cm, non tender)  Musculoskeletal:  No bony pain or tenderness  Skin and Subcutaneous Tissues: No rash, erythema, or scarring noted to face. Posterior left lower leg with erythematous rash, wrapped in gauze with minimal clear drainage (original site of MRSA infection)  Psychiatric: Mood is normal.  No other symptoms.   Neuro:  Alert and oriented, no focal neurologic deficits  Upper Extremity Lymphedema: None  Breast/Chest: bilateral mastectomy without evidence of chest wall disease  Heme/Lymphatic/Immunologic: B/l axilla and supraclav ok. - unchanged exam today    I have personally reviewed the following diagnostic studies:    Breast imaging and pathology reviewed. See Results for details  ______________________________________________________________________    Documentation assistance was provided by Norwood Levo, Scribe, on March 28, 2020 at 10:30 AM for Joylene John, MD/Parke Jandreau Beam, PA-C.    ----------------------------------------------------------------------------------------------------------------------  March 28, 2020 6:57 PM. Documentation assistance provided by the Scribe. I was present during the time the encounter was recorded. The information recorded by the Scribe was done at my direction and has been reviewed and validated by me.  ----------------------------------------------------------------------------------------------------------------------  I personally spent 40 minutes face-to-face and non-face-to-face in the care of this patient, which includes all pre, intra, and post visit time on the date of service.

## 2020-03-28 ENCOUNTER — Ambulatory Visit
Admit: 2020-03-28 | Discharge: 2020-03-28 | Payer: MEDICARE | Attending: Hematology & Oncology | Primary: Hematology & Oncology

## 2020-03-28 ENCOUNTER — Other Ambulatory Visit: Admit: 2020-03-28 | Discharge: 2020-03-28 | Payer: MEDICARE

## 2020-03-28 ENCOUNTER — Institutional Professional Consult (permissible substitution): Admit: 2020-03-28 | Discharge: 2020-03-28 | Payer: MEDICARE

## 2020-03-28 DIAGNOSIS — C786 Secondary malignant neoplasm of retroperitoneum and peritoneum: Principal | ICD-10-CM

## 2020-03-28 DIAGNOSIS — Z885 Allergy status to narcotic agent status: Principal | ICD-10-CM

## 2020-03-28 DIAGNOSIS — Z5111 Encounter for antineoplastic chemotherapy: Principal | ICD-10-CM

## 2020-03-28 DIAGNOSIS — Z9013 Acquired absence of bilateral breasts and nipples: Principal | ICD-10-CM

## 2020-03-28 DIAGNOSIS — Z1501 Genetic susceptibility to malignant neoplasm of breast: Principal | ICD-10-CM

## 2020-03-28 DIAGNOSIS — Z792 Long term (current) use of antibiotics: Principal | ICD-10-CM

## 2020-03-28 DIAGNOSIS — Z86718 Personal history of other venous thrombosis and embolism: Principal | ICD-10-CM

## 2020-03-28 DIAGNOSIS — Z170 Estrogen receptor positive status [ER+]: Principal | ICD-10-CM

## 2020-03-28 DIAGNOSIS — R531 Weakness: Principal | ICD-10-CM

## 2020-03-28 DIAGNOSIS — Z79890 Hormone replacement therapy (postmenopausal): Principal | ICD-10-CM

## 2020-03-28 DIAGNOSIS — K769 Liver disease, unspecified: Principal | ICD-10-CM

## 2020-03-28 DIAGNOSIS — R197 Diarrhea, unspecified: Principal | ICD-10-CM

## 2020-03-28 DIAGNOSIS — E039 Hypothyroidism, unspecified: Principal | ICD-10-CM

## 2020-03-28 DIAGNOSIS — D709 Neutropenia, unspecified: Principal | ICD-10-CM

## 2020-03-28 DIAGNOSIS — Z800 Family history of malignant neoplasm of digestive organs: Principal | ICD-10-CM

## 2020-03-28 DIAGNOSIS — I10 Essential (primary) hypertension: Principal | ICD-10-CM

## 2020-03-28 DIAGNOSIS — C50611 Malignant neoplasm of axillary tail of right female breast: Principal | ICD-10-CM

## 2020-03-28 DIAGNOSIS — M810 Age-related osteoporosis without current pathological fracture: Principal | ICD-10-CM

## 2020-03-28 DIAGNOSIS — C50919 Malignant neoplasm of unspecified site of unspecified female breast: Principal | ICD-10-CM

## 2020-03-28 DIAGNOSIS — C50211 Malignant neoplasm of upper-inner quadrant of right female breast: Principal | ICD-10-CM

## 2020-03-28 LAB — COMPREHENSIVE METABOLIC PANEL
ALBUMIN: 3.3 g/dL — ABNORMAL LOW (ref 3.4–5.0)
ALKALINE PHOSPHATASE: 50 U/L (ref 46–116)
ALT (SGPT): 15 U/L (ref 10–49)
ANION GAP: 5 mmol/L (ref 5–14)
AST (SGOT): 19 U/L (ref <=34)
BILIRUBIN TOTAL: 0.5 mg/dL (ref 0.3–1.2)
BLOOD UREA NITROGEN: 19 mg/dL (ref 9–23)
BUN / CREAT RATIO: 19
CALCIUM: 9.1 mg/dL (ref 8.7–10.4)
CHLORIDE: 102 mmol/L (ref 98–107)
CO2: 29 mmol/L (ref 20.0–31.0)
CREATININE: 1.01 mg/dL — ABNORMAL HIGH
EGFR CKD-EPI AA FEMALE: 65 mL/min/{1.73_m2} (ref >=60)
EGFR CKD-EPI NON-AA FEMALE: 56 mL/min/{1.73_m2} — ABNORMAL LOW (ref >=60)
GLUCOSE RANDOM: 86 mg/dL (ref 70–179)
POTASSIUM: 3.9 mmol/L (ref 3.5–5.1)
PROTEIN TOTAL: 6.7 g/dL (ref 5.7–8.2)
SODIUM: 136 mmol/L (ref 135–145)

## 2020-03-28 LAB — CBC W/ AUTO DIFF
BASOPHILS ABSOLUTE COUNT: 0.1 10*9/L (ref 0.0–0.1)
BASOPHILS RELATIVE PERCENT: 1.7 %
EOSINOPHILS ABSOLUTE COUNT: 0.1 10*9/L (ref 0.0–0.4)
EOSINOPHILS RELATIVE PERCENT: 1.7 %
HEMATOCRIT: 30.9 % — ABNORMAL LOW (ref 36.0–46.0)
HEMOGLOBIN: 10.7 g/dL — ABNORMAL LOW (ref 12.0–16.0)
LARGE UNSTAINED CELLS: 5 % — ABNORMAL HIGH (ref 0–4)
LYMPHOCYTES ABSOLUTE COUNT: 0.6 10*9/L — ABNORMAL LOW (ref 1.5–5.0)
LYMPHOCYTES RELATIVE PERCENT: 19.4 %
MEAN CORPUSCULAR HEMOGLOBIN CONC: 34.7 g/dL (ref 31.0–37.0)
MEAN CORPUSCULAR HEMOGLOBIN: 36.2 pg — ABNORMAL HIGH (ref 26.0–34.0)
MEAN CORPUSCULAR VOLUME: 104.6 fL — ABNORMAL HIGH (ref 80.0–100.0)
MEAN PLATELET VOLUME: 7.6 fL (ref 7.0–10.0)
MONOCYTES ABSOLUTE COUNT: 0.2 10*9/L (ref 0.2–0.8)
MONOCYTES RELATIVE PERCENT: 7.5 %
NEUTROPHILS ABSOLUTE COUNT: 1.9 10*9/L — ABNORMAL LOW (ref 2.0–7.5)
NEUTROPHILS RELATIVE PERCENT: 64.6 %
PLATELET COUNT: 216 10*9/L (ref 150–440)
RED BLOOD CELL COUNT: 2.95 10*12/L — ABNORMAL LOW (ref 4.00–5.20)
RED CELL DISTRIBUTION WIDTH: 15.7 % — ABNORMAL HIGH (ref 12.0–15.0)
WBC ADJUSTED: 2.9 10*9/L — ABNORMAL LOW (ref 4.5–11.0)

## 2020-03-28 LAB — PHOSPHORUS: PHOSPHORUS: 3.2 mg/dL (ref 2.4–5.1)

## 2020-03-28 LAB — CANCER ANTIGEN 27.29: CA 27-29: 75.7 U/mL — ABNORMAL HIGH (ref <=38.6)

## 2020-03-28 LAB — SLIDE REVIEW

## 2020-03-28 MED ADMIN — fulvestrant (FASLODEX) injection 500 mg: 500 mg | INTRAMUSCULAR | @ 16:00:00 | Stop: 2020-03-28

## 2020-03-28 MED ADMIN — denosumab (XGEVA) injection 120 mg: 120 mg | SUBCUTANEOUS | @ 16:00:00 | Stop: 2020-03-28

## 2020-03-28 NOTE — Unmapped (Unsigned)
Labs drawn peripherally and sent by Robynn Pane, RN. Patient tolerated well and was discharged in no distress from lab.

## 2020-03-28 NOTE — Unmapped (Signed)
Reviewed labs. Pt received fulvestrant injection in Left and right ventrogluteal and xgeva in left arm per patient request.. Tolerated it well. Applied guaze and band aid.

## 2020-03-28 NOTE — Unmapped (Signed)
It was a pleasure to see you today in the Breast Medical Oncology Clinic.      For clinical concerns during working hours, please call [phone]. My nurse navigator is Lucendia Herrlich, Charity fundraiser.    For clinical trial questions please call the study coordinator.     For emergencies, evenings or weekends, please call [phone] and ask for the oncology fellow on call.  Reasons to call the emergency line may include:  - Fever of 100.5 or greater  - Nausea and/or vomiting not relieved with nausea medicine  - Diarrhea or constipation not relieved with bowel regimen  - Severe pain not relieved with usual pain regimen    ______________________________________________________________    [facility] on COVID-19    We are facing a challenge we have never seen before with COVID-19, the new coronavirus making its way around the world and the [place] Please be assured we are focused on providing you and your loved ones with the best possible cancer care and new treatments in the safest way possible.    There are several changes to our day-to-day activities at the [facility], the clinical home of [facility].  We made two videos to help describe the ways we are working to keep you safe, such as offering the option to visit your care team over the phone or through a video, as well as support services we continue to offer to our patients and their caregivers.    Video #1: Keeping [facility] patients safe during the COVID-19 crisis  [email or web address]     Video #2: Support for cancer patients and their caregivers during the COVID-19 pandemic  [email or web address]    We are dedicated to providing you the best care and support ??? and in the safest manner possible ??? during your cancer journey.  If you have any questions about your cancer care, please call your care team

## 2020-03-29 NOTE — Unmapped (Signed)
I performed a history, relevant exam, assessment and reviewed medical decision-making with the APP.  It was necessary for me to see this patient because she is here to review staging studies and determine treatment plan.  CT/BS 12/8 revealed overall stability other than a new 4mm nonspecific hepatic finding. She is feeling well and we will continue on fulvestrant + abema + denosumab.

## 2020-04-16 MED ORDER — BENZONATATE 100 MG CAPSULE
ORAL_CAPSULE | Freq: Three times a day (TID) | ORAL | 1 refills | 10 days | Status: CP | PRN
Start: 2020-04-16 — End: 2021-04-16

## 2020-04-16 NOTE — Unmapped (Signed)
I spoke with Susan Robbins on the telephone after being requested to do so by [facility] PA.  Susan Robbins stated she was feeling fine, was exposed on Christmas Eve and tested positive over the weekend. She denies temperature, aches or pains but does have a cough that is producing some clear phlegm at this point. It is currently snowing in [place] and she feels that if the mab infusion is necessary she will need to do it in [place]. After discussing symptoms with her, she feels she would rather cancel the appointment for infusion because of her lack of symptoms but would like to have a prescription cough medicine.    The patient is aware that if she becomes more symptomatic, she will let us know

## 2020-04-20 DIAGNOSIS — J329 Chronic sinusitis, unspecified: Principal | ICD-10-CM

## 2020-04-20 DIAGNOSIS — C50919 Malignant neoplasm of unspecified site of unspecified female breast: Principal | ICD-10-CM

## 2020-04-20 MED ORDER — LEVOFLOXACIN 500 MG TABLET
ORAL_TABLET | Freq: Every day | ORAL | 0 refills | 10 days | Status: CP
Start: 2020-04-20 — End: 2020-04-27

## 2020-04-20 NOTE — Unmapped (Signed)
Patient sent a mychart message with reports of sinus congestion, facial pressure, and post nasal drip associated with cough for the past week following COVID+ test. Denies fever, chills, SOB, chest pain. Cough is well controlled with benzonatate. Patient is currently at her home in [place], [place] so is unable to come in to clinic. Will send Rx for Levaquin to local pharmacy. Reviewed follow up precautions.

## 2020-04-22 DIAGNOSIS — C50919 Malignant neoplasm of unspecified site of unspecified female breast: Principal | ICD-10-CM

## 2020-05-17 NOTE — Unmapped (Signed)
[facility] Specialty Medication Onboarding    Specialty Medication: Verzenio 150mg  tablet  Prior Authorization: Not Required   Financial Assistance: No - patient doesn't qualify for additional assistance   Final Copay/Day Supply: $3151.33 / 28 days    Insurance Restrictions: None     Notes to Pharmacist: Per MAPS, no assistance available.    The triage team has completed the benefits investigation and has determined that the patient is able to fill this medication at [facility]. Please contact the patient to complete the onboarding or follow up with the prescribing physician as needed.

## 2020-05-18 NOTE — Unmapped (Signed)
[facility]   Patient Onboarding/Medication Counseling    Susan Robbins is a 70 y.o. female with breast cancer who I am counseling today on continuation of therapy.  I am speaking to the patient.    Was a Nurse, learning disability used for this call? No    Verified patient's date of birth / HIPAA.    Specialty medication(s) to be sent: Hematology/Oncology: Verzenio 150mg       Non-specialty medications/supplies to be sent: n/a      Medications not needed at this time: n/a         Verzenio (abemaciclib)    The patient declined counseling on missed dose instructions, goals of therapy, side effects and monitoring parameters, warnings and precautions, drug/food interactions and storage, handling precautions, and disposal because they have taken the medication previously. The information in the declined sections below are for informational purposes only and was not discussed with patient.       Medication & Administration     Dosage: 150mg  (1 tablets ) by mouth twice daily (roughly 12 hours apart) Comes in a blister card.    Administration: This is an oral medication and can be taken with or without food.  Swallow the tablet whole and do not break, crush, or chew the tablet.  It may or may not be given in combination with other medications (example any aromatase inhibitor or fulvestrant).    Adherence/Missed dose instructions: If you miss a dose, skip that dose and continue to take your medication as scheduled. If you vomit after taking your dose, do not take another dose and take next dose at its normally scheduled time.    Goals of Therapy     Prevent disease progression    Side Effects & Monitoring Parameters   ??? Nausea  ??? Vomiting  ??? Diarrhea  ??? Fatigue  ??? Infection precautions (neutropenia)  ??? Anemia   ??? Decreased appetite and changes in taste  ??? Headache  ??? Bleeding precautions (thrombocytopenia)  ??? Liver toxicity  ??? Pulmonary toxicity  (new cough, trouble breathing, shortness of breath)    The following side effects should be reported to the provider:  ??? Heartbeat that doesn't feel normal (heart feels like it's racing, skipping a beat or fluttering)  ??? Signs of infection (fever, chills, cough)  ??? Excessive bruising, gums bleeding or nose bleeds  ??? Decrease in urination, change in color of urine, blood in urine or swelling in ankles  ??? Yellowing of skin or eyes  ??? New cough, trouble breathing, shortness of breath or chest pain  ??? Swelling, redness or pain in an extremity or shortness of breath (blood clots)        Contraindications, Warnings, & Precautions     ??? Abemaciclib can cause birth defects so you should not become pregnant or father a child while on this medication.  Effective birth control should be used during treatment and for 3 weeks following the completion of therapy.  ??? Signs of an allergic reaction (rash, hives, shortness of breath)    Drug/Food Interactions     ??? Avoid grapefruit and grapefruit juice  ??? Medication list reviewed in Epic. The patient was instructed to inform the care team before taking any new medications or supplements including over the counter medications, vitamins, and herbal supplements. No drug interactions identified.     Storage, Handling Precautions, & Disposal   ??? This medication should be stored at room temperature and in a dry location. Keep out of reach  of others including children and pets. Keep the medicine in the original container with a child-proof top (no pillboxes). Do not throw away or flush unused medication down the toilet or sink. This drug is considered hazardous and should be handled as little as possible.  If someone else helps with medication administration, they should wear gloves      Current Medications (including OTC/herbals), Comorbidities and Allergies     Current Outpatient Medications   Medication Sig Dispense Refill   ??? abemaciclib (VERZENIO) 150 mg Tab tablet Take 1 tablet (150 mg total) by mouth two (2) times a day with or without food. 56 tablet 11 ??? azithromycin (AZASITE) 1 % ophthalmic solution Azasite 1 % eye drops   PALCE 1 DROP IN BOTH EYES AS DIRECTED     ??? benzonatate (TESSALON PERLES) 100 MG capsule Take 1 capsule (100 mg total) by mouth Three (3) times a day as needed for cough. 30 capsule 1   ??? calcium carbonate (TUMS) 200 mg calcium (500 mg) chewable tablet Tums     ??? cholecalciferol, vitamin D3, 2,000 unit cap Vitamin D3 2,000 unit capsule   Take by oral route.     ??? clobetasol (TEMOVATE) 0.05 % external solution clobetasol 0.05 % scalp solution   1 APPLICATION APPLY ON THE SKIN AT BEDTIME APPLY AT BEDTIME TO SPOT ON SCALP FOR 2 WEEKS     ??? colestipoL (COLESTID) 1 gram tablet Take 1 tablet (1 g total) by mouth Two (2) times a day. 180 tablet 1   ??? cyclobenzaprine (FLEXERIL) 5 MG tablet Take 1 tablet (5 mg total) by mouth Three (3) times a day as needed for muscle spasms. 60 tablet 0   ??? docusate sodium (COLACE) 100 MG capsule Take 100 mg by mouth two (2) times a day as needed.     ??? ergocalciferol, vitamin D2, (VITAMIN D2 ORAL) Take 50,000 Units by mouth every seven (7) days.     ??? hydrocortisone 1 % cream Apply topically daily as needed.     ??? hyoscyamine sulfate (LEVSIN) 0.125 mg tablet Place 0.125 mg under the tongue daily as needed.     ??? ibuprofen (ADVIL,MOTRIN) 200 MG tablet Take 200 mg by mouth every six (6) hours as needed.     ??? Lactobacillus acidophilus (PROBIOTIC ORAL) Take by mouth. Takes it on/off     ??? Lactobacillus acidophilus 10 billion cell cap Probiotic     ??? levothyroxine (SYNTHROID, LEVOTHROID) 75 MCG tablet Take 75 mcg by mouth daily.     ??? loperamide (IMODIUM A-D) 2 mg tablet Takes 2 tabs with first loose stool and then 1 tablet with each subsequent loose stool 30 tablet 5   ??? ondansetron (ZOFRAN) 8 MG tablet Take 1 tablet (8 mg total) by mouth every eight (8) hours as needed for nausea. 30 tablet 2   ??? pantoprazole (PROTONIX) 40 MG tablet Take 40 mg by mouth daily as needed.     ??? triamcinolone (KENALOG) 0.1 % lotion Apply topically Three (3) times a day.     ??? valACYclovir (VALTREX) 500 MG tablet two (2) times a day as needed.       No current facility-administered medications for this visit.     Facility-Administered Medications Ordered in Other Visits   Medication Dose Route Frequency Provider Last Rate Last Admin   ??? denosumab (XGEVA) 120 mg/1.7 mL (70 mg/mL) injection            ??? denosumab (XGEVA) 120 mg/1.7 mL (  70 mg/mL) injection            ??? denosumab (XGEVA) 120 mg/1.7 mL (70 mg/mL) injection                Allergies   Allergen Reactions   ??? Berry Flavor Nausea And Vomiting     All berries   ??? Heparin Analogues Hives     Hives in the past but has had heparin recently with  No problems   ??? Penicillins Other (See Comments)     Reaction unknown occurred while in college  Has patient had a PCN reaction causing immediate rash, facial/tongue/throat swelling, SOB or lightheadedness with hypotension: reaction unknown  Has patient had a PCN reaction causing severe rash involving mucus membranes or skin necrosis: reaction unknown  Has patient had a PCN reaction that required hospitalization no  Has patient had a PCN reaction occurring within the last 10 years: no  If all of the above answers are NO, then may proceed with Cep  Reaction unknown occurred while in college  Has patient had a PCN reaction causing immediate rash, facial/tongue/throat swelling, SOB or lightheadedness with hypotension: reaction unknown  Has patient had a PCN reaction causing severe rash involving mucus membranes or skin necrosis: reaction unknown  Has patient had a PCN reaction that required hospitalization no  Has patient had a PCN reaction occurring within the last 10 years: no  If all of the above answers are NO, then may proceed with Cep   ??? Oxycodone Nausea Only       Patient Active Problem List   Diagnosis   ??? Metastatic breast cancer (CMS-HCC)       Reviewed and up to date in Epic.    Appropriateness of Therapy     Is medication and dose appropriate based on diagnosis? Yes    Prescription has been clinically reviewed: Yes    Baseline Quality of Life Assessment      How many days over the past month did your condition/medication  keep you from your normal activities? For example, brushing your teeth or getting up in the morning. 0    Financial Information     Medication Assistance provided: None Required    Anticipated copay of [phone] reviewed with patient. Verified delivery address.    Delivery Information     Scheduled delivery date: 05/22/20    Expected start date: continuation    Medication will be delivered via UPS to the prescription address in [facility].  This shipment will not require a signature.      Explained the services we provide at [facility] and that each month we would call to set up refills.  Stressed importance of returning phone calls so that we could ensure they receive their medications in time each month.  Informed patient that we should be setting up refills 7-10 days prior to when they will run out of medication.  A pharmacist will reach out to perform a clinical assessment periodically.  Informed patient that a welcome packet and a drug information handout will be sent.      Patient verbalized understanding of the above information as well as how to contact the pharmacy at [phone] option 4 with any questions/concerns.  The pharmacy is open Monday through Friday 8:30am-4:30pm.  A pharmacist is available 24/7 via pager to answer any clinical questions they may have.    Patient Specific Needs     - Does the patient have any physical,  cognitive, or cultural barriers? No    - Patient prefers to have medications discussed with  Patient     - Is the patient or caregiver able to read and understand education materials at a high school level or above? Yes    - Patient's primary language is  English     - Is the patient high risk? Yes, patient is taking oral chemotherapy. Appropriateness of therapy as been assessed    - Does the patient require a Care Management Plan? No     - Does the patient require physician intervention or other additional services (i.e. nutrition, smoking cessation, social work)? No      Florene [facility] Specialty Pharmacist

## 2020-05-20 DIAGNOSIS — C50919 Malignant neoplasm of unspecified site of unspecified female breast: Principal | ICD-10-CM

## 2020-05-21 MED FILL — VERZENIO 150 MG TABLET: ORAL | 28 days supply | Qty: 56 | Fill #0

## 2020-05-22 ENCOUNTER — Institutional Professional Consult (permissible substitution): Admit: 2020-05-22 | Discharge: 2020-05-22 | Payer: MEDICARE

## 2020-05-22 ENCOUNTER — Other Ambulatory Visit: Admit: 2020-05-22 | Discharge: 2020-05-22 | Payer: MEDICARE

## 2020-05-22 DIAGNOSIS — C50919 Malignant neoplasm of unspecified site of unspecified female breast: Principal | ICD-10-CM

## 2020-05-22 LAB — COMPREHENSIVE METABOLIC PANEL
ALBUMIN: 3.2 g/dL — ABNORMAL LOW (ref 3.4–5.0)
ALKALINE PHOSPHATASE: 58 U/L (ref 46–116)
ALT (SGPT): 11 U/L (ref 10–49)
ANION GAP: 4 mmol/L — ABNORMAL LOW (ref 5–14)
AST (SGOT): 19 U/L (ref <=34)
BILIRUBIN TOTAL: 0.4 mg/dL (ref 0.3–1.2)
BLOOD UREA NITROGEN: 14 mg/dL (ref 9–23)
BUN / CREAT RATIO: 15
CALCIUM: 8.9 mg/dL (ref 8.7–10.4)
CHLORIDE: 108 mmol/L — ABNORMAL HIGH (ref 98–107)
CO2: 29 mmol/L (ref 20.0–31.0)
CREATININE: 0.95 mg/dL — ABNORMAL HIGH
EGFR CKD-EPI AA FEMALE: 70 mL/min/{1.73_m2} (ref >=60)
EGFR CKD-EPI NON-AA FEMALE: 60 mL/min/{1.73_m2} (ref >=60)
GLUCOSE RANDOM: 92 mg/dL (ref 70–179)
POTASSIUM: 4 mmol/L (ref 3.5–5.1)
PROTEIN TOTAL: 6.6 g/dL (ref 5.7–8.2)
SODIUM: 141 mmol/L (ref 135–145)

## 2020-05-22 LAB — SLIDE REVIEW

## 2020-05-22 LAB — CBC W/ AUTO DIFF
BASOPHILS ABSOLUTE COUNT: 0 10*9/L (ref 0.0–0.1)
BASOPHILS RELATIVE PERCENT: 0.9 %
EOSINOPHILS ABSOLUTE COUNT: 0.1 10*9/L (ref 0.0–0.4)
EOSINOPHILS RELATIVE PERCENT: 2.7 %
HEMATOCRIT: 33.3 % — ABNORMAL LOW (ref 36.0–46.0)
HEMOGLOBIN: 11.1 g/dL — ABNORMAL LOW (ref 12.0–16.0)
LARGE UNSTAINED CELLS: 4 % (ref 0–4)
LYMPHOCYTES ABSOLUTE COUNT: 0.6 10*9/L — ABNORMAL LOW (ref 1.5–5.0)
LYMPHOCYTES RELATIVE PERCENT: 19.5 %
MEAN CORPUSCULAR HEMOGLOBIN CONC: 33.3 g/dL (ref 31.0–37.0)
MEAN CORPUSCULAR HEMOGLOBIN: 34.8 pg — ABNORMAL HIGH (ref 26.0–34.0)
MEAN CORPUSCULAR VOLUME: 104.5 fL — ABNORMAL HIGH (ref 80.0–100.0)
MEAN PLATELET VOLUME: 7.9 fL (ref 7.0–10.0)
MONOCYTES ABSOLUTE COUNT: 0.2 10*9/L (ref 0.2–0.8)
MONOCYTES RELATIVE PERCENT: 8 %
NEUTROPHILS ABSOLUTE COUNT: 1.9 10*9/L — ABNORMAL LOW (ref 2.0–7.5)
NEUTROPHILS RELATIVE PERCENT: 64.9 %
PLATELET COUNT: 199 10*9/L (ref 150–440)
RED BLOOD CELL COUNT: 3.18 10*12/L — ABNORMAL LOW (ref 4.00–5.20)
RED CELL DISTRIBUTION WIDTH: 14 % (ref 12.0–15.0)
WBC ADJUSTED: 3 10*9/L — ABNORMAL LOW (ref 4.5–11.0)

## 2020-05-22 MED ADMIN — denosumab (XGEVA) injection 120 mg: 120 mg | SUBCUTANEOUS | @ 16:00:00 | Stop: 2020-05-22

## 2020-05-22 MED ADMIN — fulvestrant (FASLODEX) injection 500 mg: 500 mg | INTRAMUSCULAR | @ 16:00:00 | Stop: 2020-05-22

## 2020-05-22 NOTE — Unmapped (Signed)
Pt tolerated Fulvestrant injection to bilateral ventrolateral without difficulty. She also tolerated Xgeva injection to L arm with no problems. Band aid and gauze placed over each injection site. [facility] amb[facility],steady gait, NAD, no questions, complaints, nor concerns voiced at d/c.

## 2020-05-22 NOTE — Unmapped (Signed)
Labs collected by [facility] and sent for analysis. Pt sent to next appt.

## 2020-05-23 LAB — CANCER ANTIGEN 27.29: CA 27-29: 73.2 U/mL — ABNORMAL HIGH (ref <=38.6)

## 2020-06-11 NOTE — Unmapped (Signed)
Breast Cancer Return Patient Evaluation    PCP: [name], MD   PCP [facility]): Dr. [name] [phone] (fax # [phone])  [facility]: Chalmers Cater, MD ([facility], [place]) - send notes     Reason for Visit: A 70 y.o. 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  See OncHistory for details. Letrozole / palbociclib 12/2016 with DR d/t neutropenia then discontinued 07/2017 d/t QoL issues. Continued on letrozole alone, discontinued 09/2018 d/t progression in liver and peritoneum, initiated fulvestrant + abemaciclib 10/13/18.     She still spends part of the year in [place], [place], in [place], or in [place], [place]. Has oncologists in each of these places (Drs. Nilda Riggs and Wyatt Portela).     Presents today for follow up on Fulvestrant + abemaciclib + q50m Xgeva. We discussed her restaging scans, which show stable disease. She is overall doing well and tolerating treatment well. She will continue on current regimen. She will get monthly injections in [place] until June when she comes back. We will plan on monthly injections + restaging scans on June 1st with a provider visit. She will reach out in the interim if she has any questions or concerns.     Systemic Therapy:  -- Continue fulvestrant + abemaciclib, same doses  -- Continue denosumab monthly  -- Restaging scans 06/12/20: Stable disease.     Future Options:   1. Olaparib or talazoparib (gBRCA2 mutation)   2. Alpelisib + ET (PIK3CA mutation)   3. Clinical trial: olaparib + immunotherapy. She has a HR+ cancer which tends not to be immune activated; the trial builds upon data that PARPi can induce immune activation / infiltration that might synergize with ICI. This would require biopsies of the liver lesions as well as more visits since she will be returning per protocol.  May not be feasible if receiving care in 3 different states.     Genetics.   -- Consented for HARMONY trial - however original tumor destroyed, insufficient 2018 sample - may consider biopsy at next evidence PD.  Also consider AURORA at next PD.  -- FM1 testing (liquid and bx test from 2018) showed BRCA2 mutation + PIK3CA    Dermatology. Skin damage with a plan for topical fluorouracil. Within reason, should not interfere with her breast cancer therapy.     Diarrhea. Usually has 4-6 episodes daily. Has tried changing her diet, without relief. Managed on Imodium. Is affecting QoL some days due to frequency of BMs.   -- Started Colestipol PRN 01/04/20      Bilateral leg weakness, worse on left. Sometimes has difficulty standing due to weakness. Walking straight is fine.   -- Wants referral to get PT with Olea Defor in [place]. Referral was sent and will see her when she is in [place].    Disposition:   -- Will get Faslodex/Xgeva injections in [place] until June   -- Continue abemaciclib   -- RTC in 3 months (6/1) provider visit + fulvestrant / denosumab, with restaging scans + labs on the day before  ------------------------------------   Interval Medical/ROS:  Susan Robbins presents today with husband for follow up on fulvestrant + abemaciclib + Xgeva.  -- Doing well overall.   -- Hair thinning and loss of eyelashes.   -- Daily diarrhea. Only 2-3 days a month does she have a normal bowel movement. Regularly taking Imodium  but not Colestipol.   -- No new breast related complaints.   -- Full 12 ROS reviewed and otherwise mild/none.     Labs (05/22/20): CMP ok. CBC with mild anemia (HGB=11.1). Mild neutropenia (ANC=1.9 and WBC=3.0).     Tumor marker:  Ca27.29 170 (08/26/18)--> 313.1 (09/27/18) --> 350.7 (40102) --> 419.8 (11/10/18) --> 376.1 (11/24/18) -- > 280.2 (12/08/18) --> 154.9 (01/05/19) --> 114.5 (02/02/19) --> 91.4 (03/25/19) --> 61 (09/14/19) --> 65 (09/28/19) --> 66.1 (11/09/19) --> 72.4 (12/07/19) --> 71.5 (12/29/19) --> 74.0 (02/01/20) --> 75.7 (03/28/20) --> 73.2 (05/22/20)    Staging Scans:  CT C/A/P (06/12/20): Unchanged omental stranding. Unchanged subcentimeter hepatic lesions and sclerotic bone lesions. Post radiation changes involving the left upper lobe, similar in appearance when compared to prior exam. No definite evidence of intrathoracic metastatic disease identified. Redemonstrated foci of subtle tree-in-bud nodularity seen throughout the dependent portions of the right lower lobe, overall decreased conspicuity when compared to prior exam. No new consolidation. No new sites of metastatic disease.  NM Bone scan (06/12/20): Similar-appearing mild increase in radiotracer uptake in the lateral right fourth rib when compared prior imaging. This corresponds to known sclerotic lesion on chest CT and consistent with osseous metastasis. No new abnormal focal increase in radiotracer uptake to suggest new sites of osseous metastatic disease.    Soc Hx: Has seen Dr. Chalmers Cater in [place] in past. Lives mostly in [place] - sees PCP Dr. [name] [phone] and has local oncologist to get injections + labs + scans. Married 45+ years. Son recently had a beautiful wedding. She has a website [email or web address] where she shares her cancer story to help others in her situation. Moved sister-in-law into nursing home.     History of present illness  Susan Robbins is a 70 y.o. female who presents for recommendations regarding the management of her breast cancer. I have reviewed her oncology history, which is summarized below.     Oncology History Overview Note   Seen at [facility]     Metastatic breast cancer (CMS-HCC)   06/1989 -  Other    Diagnosed with right breast DCIS     12/1997 Initial Diagnosis    Left BRCA-associated breast cancer - Stg II, 4 positive nodes, ER+. S/p bilateral mastectomy w/ recon     01/1998 - 05/1998 Chemotherapy    AC x 6 cycles and Taxol x 3 (stopped early d/t neuropathy). Dr. Cyndie Chime.     07/1998 - 08/1998 Radiation    Adjuvant radiation therapy 09/1998 Endocrine/Hormone Therapy    Started on Tamoxifen, completed 5 years 09/2003. Unable to tolerate AI      Other    BRCA testing - BRCA 2 mutation. Had hysterectomy and bilateral salpingo-oophorectomy in May 2017. Undergone EUS and seen by GI     11/2016 -  Other    See  in [facility] for assessment of ? GI bleed prompting imaging - found to not be blood (beets) but imaging found evidence disease recurrence     11/30/2016 Interval Scan(s)    CT c/a/p -c/w peritoneal carcinomatosis with peritoneal thickening and enhancement as well as mild ascites. Vague suspicious liver hypodensities. PET 9/11:  Several small hypermetabolic osseous metastases scattered in the thoracolumbar spine.  2. Widespread hypermetabolic peritoneal carcinomatosis throughout the peritoneal cavity as detailed. Small volume pelvic ascites.  3. Hypermetabolic left adrenal metastasis.  4. Hypermetabolic mediastinal nodal metastases.  5. Hypermetabolic inferior left axillary soft tissue mass adjacent  to surgical  clips, compatible with soft tissue metastasis.  6. Small dependent right pleural effusion.     12/02/2016 Biopsy    Peritoneal soft tissue biopsy - metastatic lobular carcinoma. ER 100%, PR 10%, and HER2 negative (1+)     12/10/2016 Endocrine/Hormone Therapy    Letrozole / palbociclib initiated (palbo started 01/2017). Dose reduced palbo to 100mg  for persistent grade 3 neutropenia 05/2017, then palbo discontinued altogether 07/2017 d/t MSK sx and pt QoL.      12/30/2016 Genetics      Results revealed patient has the following mutation(s): Somatic tumor testing by FN[facility]:  BRCA2 positive   PIK3CA [id number]  CCND1 amplification  FGFR1 amplification  CDH1 [id number]'36  FGF19 amplification  FGF3 amplification  FGF4 amplification  MGL1 amplificiation-equivocal   NSD3 (WHSC169)amplification  NTRK1 amplificiation- equivocal  TP53 V157G-subclonal  ZNF703 amplification      03/2017 -  Research Study Participant    HARMONY - original 1999 tumor destroyed. Met inadequate / used up       10/13/2018 -  Chemotherapy    OP BREAST FULVESTRANT / abemaciclib         Past Medical History:   Diagnosis Date   ??? BRCA positive     germline BRCA2   ??? Breast cancer (CMS-HCC) 1999   ??? Disease of thyroid gland     hypothyroidism   ??? DVT (deep venous thrombosis) (CMS-HCC) 1973    thought provoked by accident.    ??? Hypertension    ??? Osteoporosis     on Prolia until metastatic breast cancer diagnosis     Gyn History: Two children. Menopause age 16 with chemotherapy.     Past Surgical History:   Procedure Laterality Date   ??? HYSTERECTOMY     ??? MASTECTOMY Bilateral 1999     Current Outpatient Medications   Medication Sig Dispense Refill   ??? abemaciclib (VERZENIO) 150 mg Tab tablet Take 1 tablet (150 mg total) by mouth two (2) times a day with or without food. 56 tablet 11   ??? amoxicillin (AMOXIL) 875 MG tablet 875 mg daily as needed.     ??? azithromycin (AZASITE) 1 % ophthalmic solution Azasite 1 % eye drops   PALCE 1 DROP IN BOTH EYES AS DIRECTED     ??? cholecalciferol, vitamin D3, 2,000 unit cap Vitamin D3 2,000 unit capsule   Take by oral route.     ??? clobetasol (TEMOVATE) 0.05 % external solution clobetasol 0.05 % scalp solution   1 APPLICATION APPLY ON THE SKIN AT BEDTIME APPLY AT BEDTIME TO SPOT ON SCALP FOR 2 WEEKS     ??? colestipoL (COLESTID) 1 gram tablet Take 1 tablet (1 g total) by mouth Two (2) times a day. 180 tablet 1   ??? cyanocobalamin, vitamin B-12, 1,000 mcg/mL injection 1,000 mcg daily.     ??? ergocalciferol, vitamin D2, (VITAMIN D2 ORAL) Take 50,000 Units by mouth every seven (7) days.     ??? fluticasone propionate (FLONASE) 50 mcg/actuation nasal spray 50 sprays.     ??? hyoscyamine sulfate (LEVSIN) 0.125 mg tablet Place 0.125 mg under the tongue daily as needed.     ??? ibuprofen (ADVIL,MOTRIN) 200 MG tablet Take 200 mg by mouth every six (6) hours as needed.     ??? levoFLOXacin (LEVAQUIN) 500 MG tablet 500 mg once as needed.     ??? levothyroxine (SYNTHROID, LEVOTHROID) 75 MCG tablet Take 75 mcg by mouth daily.     ??? loperamide (IMODIUM A-D) 2 mg tablet  Takes 2 tabs with first loose stool and then 1 tablet with each subsequent loose stool 30 tablet 5   ??? pantoprazole (PROTONIX) 40 MG tablet Take 40 mg by mouth daily as needed.     ??? syringe with needle (BD LUER-LOK SYRINGE) 3 mL 25 gauge x 1 Syrg BD Luer-Lok Syringe 3 mL 25 gauge x 1     ??? triamcinolone (KENALOG) 0.1 % lotion Apply topically Three (3) times a day.     ??? valACYclovir (VALTREX) 500 MG tablet two (2) times a day as needed.     ??? benzonatate (TESSALON PERLES) 100 MG capsule Take 1 capsule (100 mg total) by mouth Three (3) times a day as needed for cough. (Patient not taking: Reported on 06/13/2020) 30 capsule 1   ??? calcium carbonate (TUMS) 200 mg calcium (500 mg) chewable tablet Tums     ??? cyclobenzaprine (FLEXERIL) 5 MG tablet Take 1 tablet (5 mg total) by mouth Three (3) times a day as needed for muscle spasms. 60 tablet 0   ??? docusate sodium (COLACE) 100 MG capsule Take 100 mg by mouth two (2) times a day as needed. (Patient not taking: Reported on 06/13/2020)     ??? hydrocortisone 1 % cream Apply topically daily as needed.     ??? Lactobacillus acidophilus (PROBIOTIC ORAL) Take by mouth. Takes it on/off     ??? Lactobacillus acidophilus 10 billion cell cap Probiotic     ??? ondansetron (ZOFRAN) 8 MG tablet Take 1 tablet (8 mg total) by mouth every eight (8) hours as needed for nausea. 30 tablet 2     No current facility-administered medications for this visit.     Facility-Administered Medications Ordered in Other Visits   Medication Dose Route Frequency Provider Last Rate Last Admin   ??? denosumab (XGEVA) 120 mg/1.7 mL (70 mg/mL) injection            ??? denosumab (XGEVA) 120 mg/1.7 mL (70 mg/mL) injection            ??? denosumab (XGEVA) 120 mg/1.7 mL (70 mg/mL) injection              Allergies   Allergen Reactions   ??? Berry Flavor Nausea And Vomiting     All berries   ??? Heparin Analogues Hives     Hives in the past but has had heparin recently with  No problems   ??? Penicillins Other (See Comments)     Reaction unknown occurred while in college  Has patient had a PCN reaction causing immediate rash, facial/tongue/throat swelling, SOB or lightheadedness with hypotension: reaction unknown  Has patient had a PCN reaction causing severe rash involving mucus membranes or skin necrosis: reaction unknown  Has patient had a PCN reaction that required hospitalization no  Has patient had a PCN reaction occurring within the last 10 years: no  If all of the above answers are NO, then may proceed with Cep  Reaction unknown occurred while in college  Has patient had a PCN reaction causing immediate rash, facial/tongue/throat swelling, SOB or lightheadedness with hypotension: reaction unknown  Has patient had a PCN reaction causing severe rash involving mucus membranes or skin necrosis: reaction unknown  Has patient had a PCN reaction that required hospitalization no  Has patient had a PCN reaction occurring within the last 10 years: no  If all of the above answers are NO, then may proceed with Cep   ??? Oxycodone Nausea Only     Social  History     Social History Narrative    Lives in [place] several months per year with husband Susan Robbins, however primary residence is [place] (5m / yr, has an excellent PCP who has offered to help manage care while down there) and they spend up to 2 months in a family home in [place], [place].      Family History   Problem Relation Age of Onset   ??? Pancreatic cancer Father 56        died in 4 months    ??? BRCA 1/2 Sister         BRCA 2+   ??? BRCA 1/2 Brother         BRCA 2+ (2 of 7 brothers are positive)    ??? BRCA 1/2 Brother         BRCA 2+     Review of Systems: A 12-system review of systems was obtained including: Constitutional, Eyes, ENT, Cardiovascular, Respiratory, GI, GU, Musculoskeletal, Skin, Neurological, Psychiatric, Endocrine, Heme/Lymphatic, and Allergic/Immunologic systems. It is negative or non-contributory to the patient???s management except as noted in the history above. ROS performed over the phone.     Physical Examination:    Vitals:    06/13/20 1602   BP: 137/73   Pulse: 73   Resp: 16   Temp: 36.6 ??C (97.9 ??F)   SpO2: 100%      General:  Healthy-appearing female in no acute distress  Cardiovasc:  No heaves, regular, no additional sounds. No lower extremity edema  Respiratory:  Chest clear to percussion and auscultation, unlabored  Gastrointestinal: Deferred today: Soft, nontender, no hepatomegaly. Normal bowel sounds. Nodule deep into the abdomen to the left and superior of umbilicus difficult to clearly palpate today (from prior exam: feels like might be in the abdominal musculature wall, indistinct, 3 x 3.5 cm, non tender)  Musculoskeletal:  No bony pain or tenderness  Skin and Subcutaneous Tissues: No rash, erythema, or scarring noted to face.  Psychiatric: Mood is normal.  No other symptoms.   Neuro:  Alert and oriented, no focal neurologic deficits  Upper Extremity Lymphedema: None  Breast/Chest: bilateral mastectomy without evidence of chest wall disease  Heme/Lymphatic/Immunologic: B/l axilla and supraclav ok. - unchanged exam today    I have personally reviewed the following diagnostic studies:    Breast imaging and pathology reviewed. See Results for details  ______________________________________________________________________    Documentation assistance was provided by Norwood Levo, Scribe, on June 13, 2020 at 3:30 PM for Joylene John, MD/Allison Beam, PA-C.  ----------------------------------------------------------------------------------------------------------------------  June 14, 2020 7:03 PM. Documentation assistance provided by the Scribe. I was present during the time the encounter was recorded. The information recorded by the Scribe was done at my direction and has been reviewed and validated by me.  ----------------------------------------------------------------------------------------------------------------------

## 2020-06-12 ENCOUNTER — Ambulatory Visit: Admit: 2020-06-12 | Discharge: 2020-06-25 | Payer: MEDICARE

## 2020-06-12 DIAGNOSIS — C50919 Malignant neoplasm of unspecified site of unspecified female breast: Principal | ICD-10-CM

## 2020-06-12 MED ORDER — ABEMACICLIB 150 MG TABLET
ORAL_TABLET | Freq: Two times a day (BID) | ORAL | 11 refills | 28 days | Status: CP
Start: 2020-06-12 — End: ?
  Filled 2020-06-12: qty 56, 28d supply, fill #1

## 2020-06-12 MED ADMIN — Technetium Tc-99m Oxidronate HDP: 21.5 | INTRAVENOUS | @ 18:00:00 | Stop: 2020-06-12

## 2020-06-12 MED ADMIN — iohexoL (OMNIPAQUE) 350 mg iodine/mL solution 100 mL: 100 mL | INTRAVENOUS | @ 18:00:00 | Stop: 2020-06-12

## 2020-06-12 NOTE — Unmapped (Signed)
[facility] Clinical Assessment & Refill Coordination Note    Susan Robbins, DOB: January 01, 1951  Phone: [phone] (home)     All above HIPAA information was verified with patient.     Was a Nurse, learning disability used for this call? No    Specialty Medication(s):   Hematology/Oncology: Verzenio 150mg      Current Outpatient Medications   Medication Sig Dispense Refill   ??? abemaciclib (VERZENIO) 150 mg Tab tablet Take 1 tablet (150 mg total) by mouth two (2) times a day with or without food. 56 tablet 11   ??? azithromycin (AZASITE) 1 % ophthalmic solution Azasite 1 % eye drops   PALCE 1 DROP IN BOTH EYES AS DIRECTED     ??? benzonatate (TESSALON PERLES) 100 MG capsule Take 1 capsule (100 mg total) by mouth Three (3) times a day as needed for cough. 30 capsule 1   ??? calcium carbonate (TUMS) 200 mg calcium (500 mg) chewable tablet Tums     ??? cholecalciferol, vitamin D3, 2,000 unit cap Vitamin D3 2,000 unit capsule   Take by oral route.     ??? clobetasol (TEMOVATE) 0.05 % external solution clobetasol 0.05 % scalp solution   1 APPLICATION APPLY ON THE SKIN AT BEDTIME APPLY AT BEDTIME TO SPOT ON SCALP FOR 2 WEEKS     ??? colestipoL (COLESTID) 1 gram tablet Take 1 tablet (1 g total) by mouth Two (2) times a day. 180 tablet 1   ??? cyclobenzaprine (FLEXERIL) 5 MG tablet Take 1 tablet (5 mg total) by mouth Three (3) times a day as needed for muscle spasms. 60 tablet 0   ??? docusate sodium (COLACE) 100 MG capsule Take 100 mg by mouth two (2) times a day as needed.     ??? ergocalciferol, vitamin D2, (VITAMIN D2 ORAL) Take 50,000 Units by mouth every seven (7) days.     ??? hydrocortisone 1 % cream Apply topically daily as needed.     ??? hyoscyamine sulfate (LEVSIN) 0.125 mg tablet Place 0.125 mg under the tongue daily as needed.     ??? ibuprofen (ADVIL,MOTRIN) 200 MG tablet Take 200 mg by mouth every six (6) hours as needed.     ??? Lactobacillus acidophilus (PROBIOTIC ORAL) Take by mouth. Takes it on/off     ??? Lactobacillus acidophilus 10 billion cell cap Probiotic     ??? levothyroxine (SYNTHROID, LEVOTHROID) 75 MCG tablet Take 75 mcg by mouth daily.     ??? loperamide (IMODIUM A-D) 2 mg tablet Takes 2 tabs with first loose stool and then 1 tablet with each subsequent loose stool 30 tablet 5   ??? ondansetron (ZOFRAN) 8 MG tablet Take 1 tablet (8 mg total) by mouth every eight (8) hours as needed for nausea. 30 tablet 2   ??? pantoprazole (PROTONIX) 40 MG tablet Take 40 mg by mouth daily as needed.     ??? triamcinolone (KENALOG) 0.1 % lotion Apply topically Three (3) times a day.     ??? valACYclovir (VALTREX) 500 MG tablet two (2) times a day as needed.       No current facility-administered medications for this visit.     Facility-Administered Medications Ordered in Other Visits   Medication Dose Route Frequency Provider Last Rate Last Admin   ??? denosumab (XGEVA) 120 mg/1.7 mL (70 mg/mL) injection            ??? denosumab (XGEVA) 120 mg/1.7 mL (70 mg/mL) injection            ???  denosumab (XGEVA) 120 mg/1.7 mL (70 mg/mL) injection                 Changes to medications: Susan Robbins reports no changes at this time.    Allergies   Allergen Reactions   ??? Berry Flavor Nausea And Vomiting     All berries   ??? Heparin Analogues Hives     Hives in the past but has had heparin recently with  No problems   ??? Penicillins Other (See Comments)     Reaction unknown occurred while in college  Has patient had a PCN reaction causing immediate rash, facial/tongue/throat swelling, SOB or lightheadedness with hypotension: reaction unknown  Has patient had a PCN reaction causing severe rash involving mucus membranes or skin necrosis: reaction unknown  Has patient had a PCN reaction that required hospitalization no  Has patient had a PCN reaction occurring within the last 10 years: no  If all of the above answers are NO, then may proceed with Cep  Reaction unknown occurred while in college  Has patient had a PCN reaction causing immediate rash, facial/tongue/throat swelling, SOB or lightheadedness with hypotension: reaction unknown  Has patient had a PCN reaction causing severe rash involving mucus membranes or skin necrosis: reaction unknown  Has patient had a PCN reaction that required hospitalization no  Has patient had a PCN reaction occurring within the last 10 years: no  If all of the above answers are NO, then may proceed with Cep   ??? Oxycodone Nausea Only       Changes to allergies: No    SPECIALTY MEDICATION ADHERENCE     Verzenio 150 mg: 7 days of medicine on hand     Medication Adherence    Patient reported X missed doses in the last month: 0  Specialty Medication: Verzenio 150mg           Specialty medication(s) dose(s) confirmed: Regimen is correct and unchanged.     Are there any concerns with adherence? No    Adherence counseling provided? Not needed    CLINICAL MANAGEMENT AND INTERVENTION      Clinical Benefit Assessment:    Do you feel the medicine is effective or helping your condition? Yes    Clinical Benefit counseling provided? Not needed    Adverse Effects Assessment:    Are you experiencing any side effects? No    Are you experiencing difficulty administering your medicine? No    Quality of Life Assessment:    How many days over the past month did your condition/medication  keep you from your normal activities? For example, brushing your teeth or getting up in the morning. 0    Have you discussed this with your provider? Not needed    Therapy Appropriateness:    Is therapy appropriate? Yes, therapy is appropriate and should be continued    DISEASE/MEDICATION-SPECIFIC INFORMATION      N/A    PATIENT SPECIFIC NEEDS     - Does the patient have any physical, cognitive, or cultural barriers? No    - Is the patient high risk? Yes, patient is taking oral chemotherapy. Appropriateness of therapy as been assessed    - Does the patient require a Care Management Plan? No     - Does the patient require physician intervention or other additional services (i.e. nutrition, smoking cessation, social work)? No      SHIPPING     Specialty Medication(s) to be Shipped:   Hematology/Oncology: Verzenio 150mg     Other  medication(s) to be shipped: No additional medications requested for fill at this time     Changes to insurance: No    Delivery Scheduled: Yes, Expected medication delivery date: 06/13/20.     Medication will be delivered via UPS to the confirmed prescription address in [facility].    The patient will receive a drug information handout for each medication shipped and additional FDA Medication Guides as required.  Verified that patient has previously received a Conservation officer, historic buildings.    All of the patient's questions and concerns have been addressed.    Rollen Sox   [facility] Specialty Pharmacist

## 2020-06-13 ENCOUNTER — Ambulatory Visit
Admit: 2020-06-13 | Discharge: 2020-06-14 | Payer: MEDICARE | Attending: Hematology & Oncology | Primary: Hematology & Oncology

## 2020-06-14 DIAGNOSIS — C50919 Malignant neoplasm of unspecified site of unspecified female breast: Principal | ICD-10-CM

## 2020-06-14 NOTE — Unmapped (Signed)
It was a pleasure seeing you today.     If you are interested in how or when you can get a COVID vaccine, please see the website:  CultureCritics.se  Or  look at  yourshot.org  Or call [phone] (M-F, 8 am-5pm)    Please call our Nurse Navigator: Lucendia Herrlich, [phone] if you have any interval questions or concerns:    For appointments & questions Monday through Friday 8 AM-- 5 PM   please call [phone] or Toll free [phone].    On Nights, Weekends and Holidays  Call [phone] and ask for the oncologist on call.    [facility]  [address]  [place], [place] [address]  [email or web address]    Note if you look at test results on MyChart:  As part of the nationwide 21st Century Cures Act, which started July 24, 2019, all of your hospital and clinic notes, lab tests, pathology results, and radiology results will be posted in your [facility] as soon as they are signed. We believe sharing information builds trust and allows [place] to provide the best care to you and your loved ones.   - These changes mean you may see your results before your health care provider has a chance to review them. We may want to discuss them with you, as many results are technical and may be confusing or concerning. You may have questions and we can help you interpret results based on your case.  - Please talk to your Oncology team about this change. Your health care team can help you understand when we will have all the tests needed to guide your care.  - At times, we are waiting for more than one test result or we need to talk to another member of the care team. This may delay if we need to follow up with you to discuss your results.  - You may already have an in-person or telehealth visit to discuss your results. If you do not, please call to schedule a telehealth visit with a member of your health care team.     FOR ACCURATE AND UP-TO-DATE INFORMATION ON CANCER I SUGGEST:  a.       American Society of Clinical Oncology: http://www.cancer.net/  I think the best overall. Provides excellent information on all aspects of cancer prevention, treatment, and survivorship.  b.       American Cancer society: https://www.cancer.org/cancer.html  c.       National Cancer Institute: Also good for finding clinical trials: http://www.walter.org/  d.       [facility]: Very well presented information including patient handouts for specific chemotherapy treatment plans:  http://www.bccancer.bc.ca/  e.       HERBS AND SUPPLEMENTS:  If you are taking herbs and supplements please use the following website to determine their safety: Google: [facility] Alternative medications of go to link below PhotoConference.no    FOR CAREGIVERS: Follow this link for excellent information on care giving. [email or web address].fr    EXERCISE AND NUTRITION: It's important that you exercise to keep your muscles strong.  Walking is an excellent exercise and if you're not doing so we recommend that you walk 30 minutes or more a day - 5 times a week.  This is an excellent way to keep fit.    We recommend that you eat a healthy American diet with adequate servings of fruits and vegetables.  It's important to be aware of how many calories you were taking in each day so  that you can maintain a reasonable body weight. If you are having trouble with your diet or with weight control please let us know and we can refer you to our nutritionist who can give you advice and help. This website may also be helpful DiscoHelp.si.    BONE HEALTH: We recommend taking Vitamin D 1000 units per day and 1200 mg of calcium per day. You can get more information on calcium intake by Googling: Calcium NIH, or going to the following site: https://ods.CakeDeveloper.com.cy. Exercising is also most important. Some patients may need medication for bone health and your doctor may discuss this with you.    GENERAL SURVEILLANCE PLAN:These are the national guidelines from ASCO and NCCN. The recommendations for follow-up care for breast cancer include regular physical examinations, mammograms, and breast self-examinations. The follow-up care may be provided by your oncologist or primary care doctor, as long as your primary care doctor has communicated with your oncologist about appropriate follow-up care. In addition, patients with a possible or known family history of breast cancer should be referred to a Dentist.    Visit your doctor every three to six months for the first three years after the first treatment, every six to 12 months for years four and five, and every year thereafter.    Schedule a mammogram one year after your first mammogram that led to diagnosis, but no earlier than six months after radiation therapy. Obtain a mammogram every six to 12 months thereafter.    Perform a breast self-examination every month. This procedure is not a substitute for a mammogram.    Continue to visit a gynecologist regularly. Women taking tamoxifen should report any vaginal bleeding to their doctor.    In addition to the plan outlined above, please call us if you experience:   New lumps in the breast   Bone pain   Chest pain   Abdominal pain   Shortness of breath or difficulty breathing   Persistent headaches   Persistent coughing   Rash on breast   Nipple discharge (liquid coming from the nipple)     For emergencies, evenings or weekends, please call [phone] and ask for oncology fellow on call. Reasons to call emergency for patients receiving chemotherapy may include:    Fever of 100.5 or greater  Nausea and/or vomiting not relieved with nausea medicine  Diarrhea or constipation not relieved with bowel regimen  Severe pain not relieved with usual pain regimen

## 2020-06-28 NOTE — Unmapped (Signed)
Hi,     [employer] contacted the Communication Center regarding the following:    - States they are having trouble getting in contact with the patient to deliver his Verzenio 150 mg. Will attempt once more at alternate #.     Please contact [employer] at [phone] for any follow up.    Thanks in advance,    Drema Balzarine  [facility]   [phone]

## 2020-07-04 NOTE — Unmapped (Signed)
This patient has been disenrolled from the [facility] specialty pharmacy services due to a pharmacy change. The patient is now filling at [facility] specialty pharmacy (we can't ship to [place]).    Susan Robbins  [facility] Shared [facility] Specialty Pharmacist

## 2020-09-03 DIAGNOSIS — C50919 Malignant neoplasm of unspecified site of unspecified female breast: Principal | ICD-10-CM

## 2020-09-11 ENCOUNTER — Ambulatory Visit: Admit: 2020-09-11 | Discharge: 2020-09-17 | Payer: MEDICARE

## 2020-09-12 ENCOUNTER — Institutional Professional Consult (permissible substitution): Admit: 2020-09-12 | Discharge: 2020-09-13 | Payer: MEDICARE

## 2020-09-12 ENCOUNTER — Ambulatory Visit: Admit: 2020-09-12 | Discharge: 2020-09-13 | Payer: MEDICARE

## 2020-09-12 ENCOUNTER — Other Ambulatory Visit: Admit: 2020-09-12 | Discharge: 2020-09-13 | Payer: MEDICARE

## 2020-09-12 DIAGNOSIS — Z170 Estrogen receptor positive status [ER+]: Principal | ICD-10-CM

## 2020-09-12 DIAGNOSIS — C50919 Malignant neoplasm of unspecified site of unspecified female breast: Principal | ICD-10-CM

## 2020-09-12 DIAGNOSIS — C50812 Malignant neoplasm of overlapping sites of left female breast: Principal | ICD-10-CM

## 2020-10-02 DIAGNOSIS — C50919 Malignant neoplasm of unspecified site of unspecified female breast: Principal | ICD-10-CM

## 2020-10-09 ENCOUNTER — Ambulatory Visit: Admit: 2020-10-09 | Discharge: 2020-10-10 | Payer: MEDICARE

## 2020-10-09 ENCOUNTER — Institutional Professional Consult (permissible substitution): Admit: 2020-10-09 | Discharge: 2020-10-10 | Payer: MEDICARE

## 2020-10-09 DIAGNOSIS — C50919 Malignant neoplasm of unspecified site of unspecified female breast: Principal | ICD-10-CM

## 2020-10-24 DIAGNOSIS — C50919 Malignant neoplasm of unspecified site of unspecified female breast: Principal | ICD-10-CM

## 2020-10-25 DIAGNOSIS — C50919 Malignant neoplasm of unspecified site of unspecified female breast: Principal | ICD-10-CM

## 2020-11-05 ENCOUNTER — Institutional Professional Consult (permissible substitution): Admit: 2020-11-05 | Discharge: 2020-11-05 | Payer: MEDICARE

## 2020-11-05 ENCOUNTER — Ambulatory Visit: Admit: 2020-11-05 | Discharge: 2020-11-05 | Payer: MEDICARE

## 2020-11-05 DIAGNOSIS — C50919 Malignant neoplasm of unspecified site of unspecified female breast: Principal | ICD-10-CM

## 2020-11-28 DIAGNOSIS — C50919 Malignant neoplasm of unspecified site of unspecified female breast: Principal | ICD-10-CM

## 2020-12-04 ENCOUNTER — Ambulatory Visit: Admit: 2020-12-04 | Discharge: 2020-12-04 | Payer: MEDICARE

## 2020-12-04 ENCOUNTER — Other Ambulatory Visit: Admit: 2020-12-04 | Discharge: 2020-12-04 | Payer: MEDICARE

## 2020-12-04 ENCOUNTER — Ambulatory Visit: Admit: 2020-12-04 | Discharge: 2020-12-06 | Payer: MEDICARE

## 2020-12-04 DIAGNOSIS — C50919 Malignant neoplasm of unspecified site of unspecified female breast: Principal | ICD-10-CM

## 2020-12-05 ENCOUNTER — Ambulatory Visit: Admit: 2020-12-05 | Discharge: 2020-12-05 | Payer: MEDICARE

## 2020-12-05 ENCOUNTER — Institutional Professional Consult (permissible substitution): Admit: 2020-12-05 | Discharge: 2020-12-05 | Payer: MEDICARE

## 2020-12-05 ENCOUNTER — Ambulatory Visit
Admit: 2020-12-05 | Discharge: 2020-12-05 | Payer: MEDICARE | Attending: Hematology & Oncology | Primary: Hematology & Oncology

## 2020-12-05 DIAGNOSIS — C50919 Malignant neoplasm of unspecified site of unspecified female breast: Principal | ICD-10-CM

## 2020-12-06 ENCOUNTER — Ambulatory Visit: Admit: 2020-12-06 | Discharge: 2020-12-07 | Payer: MEDICARE

## 2020-12-06 DIAGNOSIS — K3189 Other diseases of stomach and duodenum: Principal | ICD-10-CM

## 2020-12-11 NOTE — Unmapped (Signed)
EGD  Procedure #1      0  Procedure #2      [id number]  MRN      Generic??  Endoscopist      FALSE  Urgent procedure      FALSE  Do you take: Plavix, Coumadin, Lovenox, Pradaxa, Effient, Xarelto, Eliquis, Pletal, or Brilinta?      FALSE  Do you have hemophilia, von Willebrand disease, thrombocytopenia?      FALSE  Do you have a pacemaker or implanted cardiac defibrillator?      FALSE  Are you pregnant?      FALSE  Has a [place] GI provider specified the location(s)?        Which location(s) did the [facility] provider specify?      FALSE  ???? [facility]      FALSE  ???? [place]      FALSE  ???? [facility]      FALSE  ???? [facility]      FALSE  Is procedure indication for variceal banding?      FALSE  Do you have sleep apnea or wear a CPAP machine at night?      5  Height (feet)      3  Height (inches)      130  Weight (pounds)      23.0  BMI              FALSE  Do you have chronic kidney disease?      FALSE  Do you have chronic constipation or have you had poor quality bowel preps for past colonoscopies?      FALSE  Do you have Crohn's disease or ulcerative colitis?      FALSE  Have you had weight loss surgery?              FALSE  When you walk around your house or grocery store, do you have to stop and rest due to shortness of breath, chest pain, or light-headedness?      FALSE  Are you in the process of scheduling a heart ultrasound, stress test, or catheterization for a new problem?      FALSE  Have you had a heart attack, stroke or heart stent placement within the past 6 months?      FALSE  Do you ever use supplemental oxygen?      FALSE  Have you been hospitalized for cirrhosis of the liver or heart failure in the last 12 months?      FALSE  Have you been treated for mouth or throat cancer with radiation or surgery?      FALSE  Have you been told that it is difficult for doctors to insert a breathing tube in you during anesthesia?      FALSE  Have you had a heart or lung transplant?              FALSE  Are you on dialysis?      FALSE  Do you have cirrhosis of the liver?      FALSE  Do you have myasthenia gravis?      FALSE  Is the patient a prisoner?              FALSE  Are you younger than 30?      FALSE  Have you previously received propofol sedation administered by an anesthesiologist for a GI procedure?      FALSE  Do you drink an  average of more than 3 drinks of alcohol per day?      FALSE  Do you regularly take prescription medications for chronic pain?      FALSE  Do you regularly take Ativan, Klonopin, Xanax, Valium, lorazepam, clonazepam, alprazolam, or diazepam?      FALSE  Have you previously had difficulty with sedation during a GI procedure?      FALSE  Have you been diagnosed with PTSD?      FALSE  Are you allergic to fentanyl or midazolam (Versed)?      FALSE  Do you take medications for HIV?      ************************ ************************ ************************ ************************   MRN:????????????????  [id number]      Anticoag Review: No      Nurse Triage:  No      GI Clinic Consult: No      Procedure(s):  EGD 0     [facility] [place]   Endoscopist:  Generic??      Urgent:????????????????????  No ??     Prep:??????????????????????????        ************************ ************************ ************************ ************************

## 2020-12-19 ENCOUNTER — Ambulatory Visit: Admit: 2020-12-19 | Discharge: 2020-12-19 | Payer: MEDICARE

## 2020-12-19 ENCOUNTER — Encounter: Admit: 2020-12-19 | Discharge: 2020-12-19 | Payer: MEDICARE | Attending: Anesthesiology | Primary: Anesthesiology

## 2020-12-26 DIAGNOSIS — C50919 Malignant neoplasm of unspecified site of unspecified female breast: Principal | ICD-10-CM

## 2021-01-01 ENCOUNTER — Institutional Professional Consult (permissible substitution): Admit: 2021-01-01 | Discharge: 2021-01-02 | Payer: MEDICARE

## 2021-01-01 ENCOUNTER — Ambulatory Visit: Admit: 2021-01-01 | Discharge: 2021-01-02 | Payer: MEDICARE

## 2021-01-01 DIAGNOSIS — C50919 Malignant neoplasm of unspecified site of unspecified female breast: Principal | ICD-10-CM

## 2021-01-23 ENCOUNTER — Ambulatory Visit (INDEPENDENT_AMBULATORY_CARE_PROVIDER_SITE_OTHER): Payer: Medicare Other | Admitting: Gastroenterology

## 2021-01-23 ENCOUNTER — Encounter: Payer: Self-pay | Admitting: Gastroenterology

## 2021-01-23 ENCOUNTER — Other Ambulatory Visit (INDEPENDENT_AMBULATORY_CARE_PROVIDER_SITE_OTHER): Payer: Medicare Other

## 2021-01-23 VITALS — BP 122/62 | HR 89 | Ht 63.0 in | Wt 131.1 lb

## 2021-01-23 DIAGNOSIS — E7849 Other hyperlipidemia: Secondary | ICD-10-CM

## 2021-01-23 DIAGNOSIS — Z1501 Genetic susceptibility to malignant neoplasm of breast: Secondary | ICD-10-CM

## 2021-01-23 DIAGNOSIS — Z800 Family history of malignant neoplasm of digestive organs: Secondary | ICD-10-CM

## 2021-01-23 DIAGNOSIS — Z1502 Genetic susceptibility to malignant neoplasm of ovary: Secondary | ICD-10-CM | POA: Diagnosis not present

## 2021-01-23 DIAGNOSIS — C50919 Malignant neoplasm of unspecified site of unspecified female breast: Secondary | ICD-10-CM

## 2021-01-23 DIAGNOSIS — K529 Noninfective gastroenteritis and colitis, unspecified: Secondary | ICD-10-CM | POA: Diagnosis not present

## 2021-01-23 DIAGNOSIS — Z1509 Genetic susceptibility to other malignant neoplasm: Secondary | ICD-10-CM

## 2021-01-23 LAB — CBC
HCT: 30.9 % — ABNORMAL LOW (ref 36.0–46.0)
Hemoglobin: 10.8 g/dL — ABNORMAL LOW (ref 12.0–15.0)
MCHC: 35 g/dL (ref 30.0–36.0)
MCV: 99.6 fl (ref 78.0–100.0)
Platelets: 200 10*3/uL (ref 150.0–400.0)
RBC: 3.1 Mil/uL — ABNORMAL LOW (ref 3.87–5.11)
RDW: 13.4 % (ref 11.5–15.5)
WBC: 3.9 10*3/uL — ABNORMAL LOW (ref 4.0–10.5)

## 2021-01-23 LAB — TSH: TSH: 0.61 u[IU]/mL (ref 0.35–5.50)

## 2021-01-23 LAB — C-REACTIVE PROTEIN: CRP: <1 mg/dL (ref 0.5–20.0)

## 2021-01-23 LAB — SEDIMENTATION RATE: Sed Rate: 15 mm/hr (ref 0–30)

## 2021-01-23 MED ORDER — DIPHENOXYLATE-ATROPINE 2.5-0.025 MG PO TABS: ORAL_TABLET | ORAL | 0 refills | Status: AC

## 2021-01-23 NOTE — Patient Instructions (Addendum)
Your provider has requested that you go to the basement level for lab work before leaving today. Press "B" on the elevator. The lab is located at the first door on the left as you exit the elevator.  We have sent the following medications to your pharmacy for you to pick up at your convenience: Lomotil   If you are age 72 or older, your body mass index should be between 23-30. Your Body mass index is 23.23 kg/m. If this is out of the aforementioned range listed, please consider follow up with your Primary Care Provider.  If you are age 46 or younger, your body mass index should be between 19-25. Your Body mass index is 23.23 kg/m. If this is out of the aformentioned range listed, please consider follow up with your Primary Care Provider.   __________________________________________________________  The [place] [facility] providers would like to encourage you to use [facility] to communicate with providers for non-urgent requests or questions.  Due to long hold times on the telephone, sending your provider a message by [facility] may be a faster and more efficient way to get a response.  Please allow 48 business hours for a response.  Please remember that this is for non-urgent requests.   Thank you for choosing me and [facility].  Dr. Rush Landmark

## 2021-01-24 ENCOUNTER — Encounter: Payer: Self-pay | Admitting: Gastroenterology

## 2021-01-24 DIAGNOSIS — C50919 Malignant neoplasm of unspecified site of unspecified female breast: Principal | ICD-10-CM

## 2021-01-24 NOTE — Progress Notes (Signed)
GASTROENTEROLOGY OUTPATIENT CLINIC VISIT   Primary Care Provider Pcp, No No address on file None   Patient Profile: Samantha Archer is a 70 y.o. female with a pmh significant for metastatic breast cancer, prior VTE, prior skin cancer, hypothyroidism, arthritis, family history of pancreas cancer, GERD, chronic diarrhea.  The patient presents to the [facility] for an evaluation and management of problem(s) noted below:  Problem List 1. BRCA2 gene mutation positive in female   2. Family history of pancreatic cancer   3. Chronic diarrhea   4. Metastatic breast cancer ([place])     History of Present Illness This is the patient's first visit to the outpatient [place] clinic in years.  She previously was followed by Dr. Olevia Perches in [place] and also has a gastroenterologist in [place].  She is followed at [facility] for her metastatic breast cancer.  She has carried a diagnosis of metastatic breast cancer for at least the last 4 years.  Patient describes a prior history of IBS.  Patient has had prior colonoscopies and she believes the last one was within the last 5 years.  She has a family history significant for pancreatic cancer and actually meets criteria for high risk pancreatic cancer screening but has not followed through with this in normal fashion as result of the diagnosis of her metastatic breast cancer.  Patient's reason for evaluation here in [place] is so that she can have an established gastroenterologist once again.  She has experienced diarrheal symptoms for quite a few years.  She believes the medications that she is on in regards to Fulvestrant and abemaciclib are the reasons that she has been experiencing diarrheal symptoms.  She has used Imodium at times but when she takes Imodium 4 mg in the morning she will develop constipation.  So she takes 2 mg in the morning and pending any further bouts or persistence of diarrheal symptoms will take a second 2 mg dose.   Overall this helps her but she does have periods of constipation that do develop at times.  On recent imaging done at [facility] there was concern for antral thickening that could potentially be concerning for a cancerous process.  A direct endoscopy was performed within the last few weeks and showed no evidence of significant abnormalities.  She has been on PPI therapy.  The patient believes her IBS symptoms have been under relatively okay control at this time.  She is not sure if she would want to undergo true high risk pancreas cancer screening as she is already getting imaging studies so frequently for her metastatic breast cancer and was told that something would probably be seen within the pancreas if something were to occur due to how often she is getting imaging.  GI Review of Systems Positive as above Negative for dysphagia, odynophagia, vomiting, melena, hematochezia  Review of Systems General: Denies fevers/chills/weight loss unintentionally HEENT: Denies oral lesions Cardiovascular: Denies chest pain Pulmonary: Denies shortness of breath Gastroenterological: See HPI Genitourinary: Denies darkened urine Hematological: Denies easy bruising/bleeding Endocrine: Denies temperature intolerance Dermatological: Denies jaundice Psychological: Mood is stable   Medications Current Outpatient Medications  Medication Sig Dispense Refill   abemaciclib (VERZENIO) 150 MG tablet Verzenio 150 mg tablet  Take 1 tablet twice a day by oral route.     diphenoxylate-atropine (LOMOTIL) 2.5-0.025 MG tablet Take 1-2 tablets by mouth daily as needed for diarrhea or loose stools. 30 tablet 0   ergocalciferol (VITAMIN D2) 50000 units capsule Take 50,000 Units by  mouth once a week.     Fulvestrant (FASLODEX IM) Inject into the muscle every 28 (twenty-eight) days.     hyoscyamine (ANASPAZ) 0.125 MG TBDP disintergrating tablet Place 0.125 mg under the tongue daily as needed for cramping.     ibuprofen  (ADVIL,MOTRIN) 200 MG tablet Take 200 mg by mouth every 6 (six) hours as needed for mild pain.     levothyroxine (SYNTHROID, LEVOTHROID) 75 MCG tablet Take 75 mcg by mouth daily.      Multiple Vitamin (MULTIVITAMIN WITH MINERALS) TABS tablet Take 1 tablet by mouth daily. Reported on 09/17/2015     pantoprazole (PROTONIX) 40 MG tablet Take 40 mg by mouth daily as needed (For heartburn or acid refux.).     Polyvinyl Alcohol-Povidone (REFRESH OP) Place 1 drop into both eyes daily as needed (For dry eyes.).     valACYclovir (VALTREX) 1000 MG tablet Take 1 tablet (1,000 mg total) by mouth 2 (two) times daily. 20 tablet 3   No current facility-administered medications for this visit.    Allergies Allergies  Allergen Reactions   Heparin Hives    Reaction from 30 years ago had to put her on Coumadin instead. Hives in the past but has had heparin recently with  No problems   Penicillins Other (See Comments)    Reaction unknown occurred while in college Has patient had a PCN reaction causing immediate rash, facial/tongue/throat swelling, SOB or lightheadedness with hypotension: reaction unknown Has patient had a PCN reaction causing severe rash involving mucus membranes or skin necrosis: reaction unknown Has patient had a PCN reaction that required hospitalization no Has patient had a PCN reaction occurring within the last 10 years: no If all of the above answers are "NO", then may proceed with Cep Reaction unknown occurred while in college Has patient had a PCN reaction causing immediate rash, facial/tongue/throat swelling, SOB or lightheadedness with hypotension: reaction unknown Has patient had a PCN reaction causing severe rash involving mucus membranes or skin necrosis: reaction unknown Has patient had a PCN reaction that required hospitalization no Has patient had a PCN reaction occurring within the last 10 years: no If all of the above answers are "NO", then may proceed with Cep Reaction  unknown occurred while in college Has patient had a PCN reaction causing immediate rash, facial/tongue/throat swelling, SOB or lightheadedness with hypotension: reaction unknown Has patient had a PCN reaction causing severe rash involving mucus membranes or skin necrosis: reaction unknown Has patient had a PCN reaction that required hospitalization no Has patient had a PCN reaction occurring within the last 10 years: no If all of the above answers are "NO", then may proceed with Cep   Oxycodone Nausea Only    Histories Past Medical History:  Diagnosis Date   Arthritis    Breast cancer (HCC)    Breast cancer, stage 4 (HCC) 12/04/2016   12/02/16 Peritoneal recurrence ER positive; known BRACA 2 status    Cancer (HCC)    bilat mastectomies   Diverticulosis    DVT (deep venous thrombosis) (HCC) 1974   GERD (gastroesophageal reflux disease)    History of skin cancer    Hyperglycemia 11/14/2015   Hypothyroidism    Neuropathy    Osteoporosis    PONV (postoperative nausea and vomiting)    Past Surgical History:  Procedure Laterality Date   BREAST SURGERY     DILATION AND CURETTAGE OF UTERUS     FRACTURE SURGERY Left 09/2010   HARDWARE REMOVAL  01/01/2012  Procedure: HARDWARE REMOVAL;  Surgeon: Ninetta Lights, MD;  Location: [facility];  Service: Orthopedics;  Laterality: Left;  left knee arthroscopy with debridement/shaving( chondroplasty) hardware removal   HYSTEROSCOPY WITH D & C  08/04/2003   also polypectomy   KNEE ARTHROPLASTY  6/12x2   patella-lt   KNEE ARTHROSCOPY  01/01/2012   Procedure: ARTHROSCOPY KNEE;  Surgeon: Ninetta Lights, MD;  Location: [facility];  Service: Orthopedics;  Laterality: Left;  left knee arthroscopy with debridement/shaving( chondroplasty) hardware removal   MASTECTOMY     bilateral, with reconstruction   MODIFIED RADICAL MASTECTOMY W/ AXILLARY LYMPH NODE DISSECTION  1991   right   MODIFIED RADICAL MASTECTOMY W/ AXILLARY  LYMPH NODE DISSECTION  1999   left   RECONSTRUCTION BREAST W/ TRAM FLAP  1999   bilat   ROBOTIC ASSISTED TOTAL HYSTERECTOMY WITH BILATERAL SALPINGO OOPHERECTOMY Bilateral 08/28/2015   Procedure: XI ROBOTIC ASSISTED TOTAL HYSTERECTOMY WITH BILATERAL SALPINGO OOPHORECTOMY WITH PERITONEAL WASHINGS;  Surgeon: Everitt Amber, MD;  Location: [facility];  Service: Gynecology;  Laterality: Bilateral;   TONSILLECTOMY     Social History   Socioeconomic History   Marital status: Married    Spouse name: Rayburn Ma    Number of children: 2   Years of education: 48   Highest education level: Not on file  Occupational History   Occupation: retired   Tobacco Use   Smoking status: Never   Smokeless tobacco: Never  Vaping Use   Vaping Use: Never used  Substance and Sexual Activity   Alcohol use: Yes    Alcohol/week: 12.0 standard drinks    Types: 12 Glasses of wine per week    Comment: daily wine   Drug use: No   Sexual activity: Yes    Birth control/protection: Surgical  Other Topics Concern   Not on file  Social History Narrative   Married   2 sons   Never smoked   Alcohol wine daily   Exercise 7 days a week   POA, Living Will   Social Determinants of Health   Financial Resource Strain: Not on file  Food Insecurity: Not on file  Transportation Needs: Not on file  Physical Activity: Not on file  Stress: Not on file  Social Connections: Not on file  Intimate Partner Violence: Not on file   Family History  Problem Relation Age of Onset   Stomach cancer Mother        died at 41   Diabetes Mother    Cancer Mother    Pancreatic cancer Father        died at 79   Cancer Father    Memory loss Brother        B12 deficiency   Uterine cancer Maternal Grandmother    Cancer Paternal Grandfather    Colon cancer Neg Hx    Esophageal cancer Neg Hx    Inflammatory bowel disease Neg Hx    Liver disease Neg Hx    Rectal cancer Neg Hx    I have reviewed her medical, social, and family history in  detail and updated the electronic medical record as necessary.    PHYSICAL EXAMINATION  BP 122/62   Pulse 89   Ht $R'5\' 3"'oy$  (1.6 m)   Wt 131 lb 2 oz (59.5 kg)   SpO2 100%   BMI 23.23 kg/m  Wt Readings from Last 3 Encounters:  01/23/21 131 lb 2 oz (59.5 kg)  12/10/16 125 lb 6.4 oz (56.9 kg)  12/04/16 129 lb 8 oz (58.7 kg)  GEN: NAD, appears stated age, doesn't appear chronically ill PSYCH: Cooperative, without pressured speech EYE: Conjunctivae pink, sclerae anicteric ENT: Masked CV: Nontachycardic RESP: No audible wheezing GI: NABS, soft, NT/ND, without rebound MSK/EXT: No significant lower extremity edema SKIN: No jaundice NEURO:  Alert & Oriented x 3, no focal deficits   REVIEW OF DATA  I reviewed the following data at the time of this encounter:  GI Procedures and Studies  2011 colonoscopy ENDOSCOPIC IMPRESSION:     1) No polyps or cancers     2) Normal colonoscopy     no recurrent polyps  2017 EGD outside No esophageal varices noted.  Obvious reflux was seen.  Mild inflammation noted at the gastroesophageal junction at 41 cm.  Few white plaques were noted in the esophagus which were brushed for candidiasis.  No strictures or ulcerations were noted. Moderate diffuse gastritis was noted without ulcers or lesions.  The pylorus was central and symmetrical. Mild duodenitis was noted in the duodenal bulb and postbulbar region.  Small bowel biopsies were obtained.  2017 colonoscopy outside Terminal ileum appeared normal. Mild diverticulosis was scarred in the colon mainly in the right colon.  Mild diffuse erythema was noted in the rectum and colon most pronounced in the left colon.  Biopsies were taken within the terminal ileum and ascending and transverse and descending and sigmoid and rectum.  2 approximately 1 cm polyps were noted 1 in the proximal descending colon and one in the rectum.  These were snared.  Hemorrhoids were seen.  2022 EGD outside. Impression:              - Dilated lacteals were found in the duodenum.                        Biopsied.             - Erythematous mucosa in the greater curvature of the                        gastric antrum. Biopsied.             - The examination was otherwise normal. Pathology A: Duodenum, biopsy - Duodenal mucosa with intact villous architecture and increased lamina propria cellularity - No increased intraepithelial lymphocytes identified  - Negative for metastatic breast cancer - See comment  B: Stomach, antrum, biopsy - Gastric antral mucosa with chronic superficial gastritis and reactive foveolar hyperplasia - No Helicobacter pylori identified on immunohistochemical stain - Negative for metastatic breast cancer  Laboratory Studies  Reviewed those in epic and care everywhere  Imaging Studies  August 2022 outside CT abdomen pelvis with contrast Since 09/11/2020:  -Unchanged hypoattenuating lesion in the right hepatic dome (1:11).  -Unchanged omental/gastrocolic ligamentous fatty thickening when compared to multiple prior exams.  -No new sites of metastatic disease in the abdomen or pelvis.  --Increasing hypoattenuation in the gastric wall at the level of the antrum, which is nonspecific although can be seen in the setting of gastritis/peptic ulcer disease. Recommend clinical correlation.  --Please see same day chest CT for findings above the diaphragm.   ASSESSMENT  Ms. Ellithorpe is a 70 y.o. female with a pmh significant for metastatic breast cancer, prior VTE, prior skin cancer, hypothyroidism, arthritis, family history of pancreas cancer, GERD, chronic diarrhea.  The patient is seen today for evaluation and management of:  1. BRCA2 gene mutation positive  in female   2. Family history of pancreatic cancer   3. Chronic diarrhea   4. Metastatic breast cancer [facility])    The patient is hemodynamically stable.  Clinically she has had chronic diarrheal symptoms for a significant time.  She and her medical  team have felt that this is a result of the medications that she has been on.  It looks like back in 2017 at least she had biopsies performed of her colon but the actual results of those I do not know.  We are going to work on trying to get those results from her gastroenterologist in [place].  If she has persistent symptoms, we could consider at least a sigmoidoscopy to repeat and ensure that no evidence of lymphocytic/microscopic colitis has developed.  We discussed how we may be able to further assess some of her symptoms including the possibility of exocrine pancreas insufficiency and possibility of small intestine bacterial overgrowth.  We are going to begin with stool studies and rule out basic metabolic issues.  Pending these findings we can consider endoscopic evaluation if the patient desires.  I discussed the possibility of increased bulking fiber but the patient has been told by her medical team and pharmacist team that fiber will only worsen symptoms on the medications that she is on so she is not excited about trying to do that.  I will do some research about her chemotherapy agents that do have history of diarrheal symptoms being an issue.  We will give the patient a short course of Lomotil prescription to see if that may be more effective than the Imodium for her.  We will see what her stool studies look like.  We can decide with the patient in the future what further work-up or testing may be desired.  It is not clear that her long-term benefit of reenrolling in the pancreatic cancer high risk screening cohort is really there but pending how the patient is doing this can be reconsidered in the future in regards to the need for EUS.  All patient questions were answered to the best of my ability, and the patient agrees to the aforementioned plan of action with follow-up as indicated.   PLAN  Laboratories as outlined below Stool studies as outlined below Fecal elastase testing to be  performed Consider SIBO breath testing Consider cholestyramine use though she still has her gallbladder May continue to use Imodium but will give a short trial of Lomotil see if that is more effective for her Try to obtain pathology results from last colonoscopy Consider flexible sigmoidoscopy diagnostic to rule out new onset microscopic/collagenous colitis in the future Patient will not be reenrolled in the high risk pancreatic cancer screening cohort due to her known metastatic breast cancer Research her chemotherapeutic agents If no contraindication will recommend bulking fibers   Orders Placed This Encounter  Procedures   CBC   TSH   Sedimentation rate   C-reactive protein   Clostridium difficile Toxin B, Qualitative, Real-Time PCR   Gastrointestinal Pathogen Panel PCR   Pancreatic elastase, fecal    New Prescriptions   DIPHENOXYLATE-ATROPINE (LOMOTIL) 2.5-0.025 MG TABLET    Take 1-2 tablets by mouth daily as needed for diarrhea or loose stools.   Modified Medications   No medications on file    Planned Follow Up No follow-ups on file.   Total Time in Face-to-Face and in Coordination of Care for patient including independent/personal interpretation/review of prior testing, medical history, examination, medication adjustment, communicating results with  the patient directly, and documentation within the EHR is 45 minutes.   Justice Britain, MD [facility] Office # [phone]

## 2021-01-25 DIAGNOSIS — K529 Noninfective gastroenteritis and colitis, unspecified: Secondary | ICD-10-CM | POA: Insufficient documentation

## 2021-01-29 ENCOUNTER — Other Ambulatory Visit: Payer: Medicare Other

## 2021-01-29 DIAGNOSIS — Z800 Family history of malignant neoplasm of digestive organs: Secondary | ICD-10-CM

## 2021-01-29 DIAGNOSIS — Z1501 Genetic susceptibility to malignant neoplasm of breast: Secondary | ICD-10-CM

## 2021-01-29 DIAGNOSIS — K529 Noninfective gastroenteritis and colitis, unspecified: Secondary | ICD-10-CM

## 2021-01-29 DIAGNOSIS — Z1509 Genetic susceptibility to other malignant neoplasm: Secondary | ICD-10-CM

## 2021-01-30 ENCOUNTER — Other Ambulatory Visit: Admit: 2021-01-30 | Discharge: 2021-01-30 | Payer: MEDICARE

## 2021-01-30 ENCOUNTER — Ambulatory Visit: Admit: 2021-01-30 | Discharge: 2021-01-30 | Payer: MEDICARE

## 2021-01-30 ENCOUNTER — Institutional Professional Consult (permissible substitution): Admit: 2021-01-30 | Discharge: 2021-01-30 | Payer: MEDICARE

## 2021-01-30 DIAGNOSIS — C50919 Malignant neoplasm of unspecified site of unspecified female breast: Principal | ICD-10-CM

## 2021-01-31 ENCOUNTER — Other Ambulatory Visit: Payer: Self-pay

## 2021-01-31 ENCOUNTER — Other Ambulatory Visit (INDEPENDENT_AMBULATORY_CARE_PROVIDER_SITE_OTHER): Payer: Medicare Other

## 2021-01-31 DIAGNOSIS — K581 Irritable bowel syndrome with constipation: Secondary | ICD-10-CM

## 2021-01-31 DIAGNOSIS — D509 Iron deficiency anemia, unspecified: Secondary | ICD-10-CM

## 2021-01-31 DIAGNOSIS — D649 Anemia, unspecified: Secondary | ICD-10-CM

## 2021-01-31 LAB — IBC + FERRITIN
Ferritin: 167.2 ng/mL (ref 10.0–291.0)
Iron: 37 ug/dL — ABNORMAL LOW (ref 42–145)
Saturation Ratios: 10.2 % — ABNORMAL LOW (ref 20.0–50.0)
TIBC: 361.2 ug/dL (ref 250.0–450.0)
Transferrin: 258 mg/dL (ref 212.0–360.0)

## 2021-01-31 LAB — VITAMIN B12: Vitamin B-12: 208 pg/mL — ABNORMAL LOW (ref 211–911)

## 2021-01-31 LAB — FOLATE: Folate: 14.4 ng/mL (ref >5.9)

## 2021-01-31 NOTE — Progress Notes (Signed)
Lab order entered. Pt will come by office today to have labs drawn.

## 2021-02-01 ENCOUNTER — Other Ambulatory Visit: Payer: Self-pay

## 2021-02-01 DIAGNOSIS — D509 Iron deficiency anemia, unspecified: Secondary | ICD-10-CM

## 2021-02-01 DIAGNOSIS — R6889 Other general symptoms and signs: Secondary | ICD-10-CM

## 2021-02-01 DIAGNOSIS — D649 Anemia, unspecified: Secondary | ICD-10-CM

## 2021-02-01 LAB — GI PROFILE, STOOL, PCR

## 2021-02-01 MED ORDER — B-12 2000 MCG PO TABS: 2000.0000 ug | ORAL_TABLET | Freq: Every day | ORAL | 1 refills | Status: AC

## 2021-02-01 MED ORDER — FERROUS GLUCONATE 324 (38 FE) MG PO TABS: 324.0000 mg | ORAL_TABLET | Freq: Every day | ORAL | 1 refills | Status: DC

## 2021-02-06 LAB — CLOSTRIDIUM DIFFICILE TOXIN B, QUALITATIVE, REAL-TIME PCR: Toxigenic C. Difficile by PCR: NOT DETECTED

## 2021-02-06 LAB — PANCREATIC ELASTASE, FECAL: Pancreatic Elastase-1, Stool: 463 mcg/g

## 2021-02-22 DIAGNOSIS — M87180 Osteonecrosis due to drugs, jaw: Principal | ICD-10-CM

## 2021-02-22 DIAGNOSIS — C50919 Malignant neoplasm of unspecified site of unspecified female breast: Principal | ICD-10-CM

## 2021-02-26 ENCOUNTER — Ambulatory Visit: Admit: 2021-02-26 | Discharge: 2021-02-27 | Payer: MEDICARE

## 2021-02-26 ENCOUNTER — Ambulatory Visit: Admit: 2021-02-26 | Discharge: 2021-02-28 | Payer: MEDICARE

## 2021-02-26 ENCOUNTER — Other Ambulatory Visit: Payer: Self-pay | Admitting: Gastroenterology

## 2021-02-27 ENCOUNTER — Institutional Professional Consult (permissible substitution): Admit: 2021-02-27 | Discharge: 2021-02-27 | Payer: MEDICARE

## 2021-02-27 ENCOUNTER — Ambulatory Visit
Admit: 2021-02-27 | Discharge: 2021-02-27 | Payer: MEDICARE | Attending: Hematology & Oncology | Primary: Hematology & Oncology

## 2021-02-27 ENCOUNTER — Other Ambulatory Visit: Admit: 2021-02-27 | Discharge: 2021-02-27 | Payer: MEDICARE

## 2021-02-27 DIAGNOSIS — C50919 Malignant neoplasm of unspecified site of unspecified female breast: Principal | ICD-10-CM

## 2021-02-27 DIAGNOSIS — C50912 Malignant neoplasm of unspecified site of left female breast: Principal | ICD-10-CM

## 2021-03-17 ENCOUNTER — Encounter: Payer: Self-pay | Admitting: Gastroenterology

## 2021-03-25 ENCOUNTER — Institutional Professional Consult (permissible substitution): Admit: 2021-03-25 | Discharge: 2021-03-25 | Payer: MEDICARE

## 2021-03-25 ENCOUNTER — Ambulatory Visit: Admit: 2021-03-25 | Discharge: 2021-03-25 | Payer: MEDICARE

## 2021-03-25 DIAGNOSIS — C50919 Malignant neoplasm of unspecified site of unspecified female breast: Principal | ICD-10-CM

## 2021-05-13 DIAGNOSIS — C50919 Malignant neoplasm of unspecified site of unspecified female breast: Principal | ICD-10-CM

## 2021-05-21 ENCOUNTER — Ambulatory Visit: Admit: 2021-05-21 | Discharge: 2021-05-22 | Payer: MEDICARE

## 2021-05-21 ENCOUNTER — Ambulatory Visit: Admit: 2021-05-21 | Discharge: 2021-05-21 | Payer: MEDICARE

## 2021-05-22 ENCOUNTER — Ambulatory Visit: Admit: 2021-05-22 | Discharge: 2021-05-22 | Payer: MEDICARE

## 2021-05-22 ENCOUNTER — Ambulatory Visit
Admit: 2021-05-22 | Discharge: 2021-05-22 | Payer: MEDICARE | Attending: Hematology & Oncology | Primary: Hematology & Oncology

## 2021-05-22 DIAGNOSIS — C801 Malignant (primary) neoplasm, unspecified: Principal | ICD-10-CM

## 2021-05-22 DIAGNOSIS — C50919 Malignant neoplasm of unspecified site of unspecified female breast: Principal | ICD-10-CM

## 2021-06-05 DIAGNOSIS — C50919 Malignant neoplasm of unspecified site of unspecified female breast: Principal | ICD-10-CM

## 2021-06-05 MED ORDER — OLAPARIB 100 MG TABLET
ORAL_CAPSULE | Freq: Two times a day (BID) | ORAL | 0 refills | 30.00000 days | Status: CP
Start: 2021-06-05 — End: 2021-06-05

## 2021-06-07 DIAGNOSIS — C50919 Malignant neoplasm of unspecified site of unspecified female breast: Principal | ICD-10-CM

## 2021-06-07 MED ORDER — OLAPARIB 100 MG TABLET
ORAL_CAPSULE | Freq: Two times a day (BID) | ORAL | 0 refills | 30.00000 days | Status: CP
Start: 2021-06-07 — End: ?

## 2021-06-28 DIAGNOSIS — C50919 Malignant neoplasm of unspecified site of unspecified female breast: Principal | ICD-10-CM

## 2021-06-28 MED ORDER — LYNPARZA 100 MG TABLET
ORAL_TABLET | 0 refills | 0 days
Start: 2021-06-28 — End: ?

## 2021-07-01 MED ORDER — LYNPARZA 100 MG TABLET
ORAL_TABLET | 0 refills | 0 days | Status: CP
Start: 2021-07-01 — End: ?

## 2021-07-02 DIAGNOSIS — C50919 Malignant neoplasm of unspecified site of unspecified female breast: Principal | ICD-10-CM

## 2021-07-10 ENCOUNTER — Ambulatory Visit
Admit: 2021-07-10 | Discharge: 2021-07-11 | Payer: MEDICARE | Attending: Hematology & Oncology | Primary: Hematology & Oncology

## 2021-07-10 ENCOUNTER — Other Ambulatory Visit: Admit: 2021-07-10 | Discharge: 2021-07-11 | Payer: MEDICARE

## 2021-07-10 DIAGNOSIS — C50919 Malignant neoplasm of unspecified site of unspecified female breast: Principal | ICD-10-CM

## 2021-07-10 DIAGNOSIS — Z170 Estrogen receptor positive status [ER+]: Principal | ICD-10-CM

## 2021-07-10 DIAGNOSIS — C50812 Malignant neoplasm of overlapping sites of left female breast: Principal | ICD-10-CM

## 2021-07-19 DIAGNOSIS — C50919 Malignant neoplasm of unspecified site of unspecified female breast: Principal | ICD-10-CM

## 2021-07-19 MED ORDER — LYNPARZA 100 MG TABLET
ORAL_TABLET | 0 refills | 0 days | Status: CP
Start: 2021-07-19 — End: ?

## 2021-07-26 DIAGNOSIS — C50919 Malignant neoplasm of unspecified site of unspecified female breast: Principal | ICD-10-CM

## 2021-07-26 DIAGNOSIS — M25559 Pain in unspecified hip: Principal | ICD-10-CM

## 2021-07-26 DIAGNOSIS — M545 Chronic bilateral low back pain without sciatica: Principal | ICD-10-CM

## 2021-07-26 DIAGNOSIS — G8929 Other chronic pain: Principal | ICD-10-CM

## 2021-08-13 ENCOUNTER — Ambulatory Visit: Admit: 2021-08-13 | Discharge: 2021-08-13 | Payer: MEDICARE

## 2021-08-13 ENCOUNTER — Ambulatory Visit: Admit: 2021-08-13 | Discharge: 2021-08-14 | Payer: MEDICARE

## 2021-08-13 DIAGNOSIS — C50919 Malignant neoplasm of unspecified site of unspecified female breast: Principal | ICD-10-CM

## 2021-08-14 ENCOUNTER — Ambulatory Visit
Admit: 2021-08-14 | Discharge: 2021-08-15 | Payer: MEDICARE | Attending: Hematology & Oncology | Primary: Hematology & Oncology

## 2021-08-14 DIAGNOSIS — C50812 Malignant neoplasm of overlapping sites of left female breast: Principal | ICD-10-CM

## 2021-08-14 DIAGNOSIS — Z170 Estrogen receptor positive status [ER+]: Principal | ICD-10-CM

## 2021-08-14 DIAGNOSIS — M87180 Osteonecrosis due to drugs, jaw: Principal | ICD-10-CM

## 2021-08-14 DIAGNOSIS — T458X5A Adverse effect of other primarily systemic and hematological agents, initial encounter: Principal | ICD-10-CM

## 2021-08-14 DIAGNOSIS — C50919 Malignant neoplasm of unspecified site of unspecified female breast: Principal | ICD-10-CM

## 2021-08-14 DIAGNOSIS — C786 Secondary malignant neoplasm of retroperitoneum and peritoneum: Principal | ICD-10-CM

## 2021-08-20 DIAGNOSIS — C50919 Malignant neoplasm of unspecified site of unspecified female breast: Principal | ICD-10-CM

## 2021-08-20 MED ORDER — OLAPARIB 100 MG TABLET
ORAL_TABLET | Freq: Two times a day (BID) | ORAL | 2 refills | 30 days | Status: CP
Start: 2021-08-20 — End: ?

## 2021-09-18 ENCOUNTER — Ambulatory Visit: Admit: 2021-09-18 | Discharge: 2021-09-19 | Payer: MEDICARE

## 2021-09-18 ENCOUNTER — Other Ambulatory Visit: Admit: 2021-09-18 | Discharge: 2021-09-19 | Payer: MEDICARE

## 2021-09-18 DIAGNOSIS — C50919 Malignant neoplasm of unspecified site of unspecified female breast: Principal | ICD-10-CM

## 2021-09-18 DIAGNOSIS — J329 Chronic sinusitis, unspecified: Principal | ICD-10-CM

## 2021-09-18 MED ORDER — LEVOFLOXACIN 500 MG TABLET
ORAL_TABLET | Freq: Every day | ORAL | 0 refills | 7 days | Status: CP
Start: 2021-09-18 — End: 2021-09-25

## 2021-09-19 DIAGNOSIS — J329 Chronic sinusitis, unspecified: Principal | ICD-10-CM

## 2021-09-19 DIAGNOSIS — C50919 Malignant neoplasm of unspecified site of unspecified female breast: Principal | ICD-10-CM

## 2021-09-19 MED ORDER — LEVOFLOXACIN 500 MG TABLET
ORAL_TABLET | Freq: Every day | ORAL | 0 refills | 7 days | Status: CP
Start: 2021-09-19 — End: 2021-09-26

## 2021-10-08 ENCOUNTER — Ambulatory Visit: Admit: 2021-10-08 | Discharge: 2021-10-09 | Payer: MEDICARE

## 2021-10-08 DIAGNOSIS — C50919 Malignant neoplasm of unspecified site of unspecified female breast: Principal | ICD-10-CM

## 2021-10-29 ENCOUNTER — Ambulatory Visit: Admit: 2021-10-29 | Discharge: 2021-10-29 | Payer: MEDICARE

## 2021-10-29 ENCOUNTER — Ambulatory Visit: Admit: 2021-10-29 | Discharge: 2021-10-30 | Payer: MEDICARE

## 2021-10-30 ENCOUNTER — Ambulatory Visit
Admit: 2021-10-30 | Discharge: 2021-10-31 | Payer: MEDICARE | Attending: Hematology & Oncology | Primary: Hematology & Oncology

## 2021-11-11 DIAGNOSIS — C50919 Malignant neoplasm of unspecified site of unspecified female breast: Principal | ICD-10-CM

## 2021-11-11 MED ORDER — OLAPARIB 100 MG TABLET
ORAL_TABLET | Freq: Two times a day (BID) | ORAL | 5 refills | 30 days | Status: CP
Start: 2021-11-11 — End: ?

## 2021-12-04 ENCOUNTER — Other Ambulatory Visit: Admit: 2021-12-04 | Discharge: 2021-12-04 | Payer: MEDICARE

## 2021-12-04 ENCOUNTER — Ambulatory Visit: Admit: 2021-12-04 | Discharge: 2021-12-04 | Payer: MEDICARE

## 2021-12-04 DIAGNOSIS — C50919 Malignant neoplasm of unspecified site of unspecified female breast: Principal | ICD-10-CM

## 2021-12-27 DIAGNOSIS — C50919 Malignant neoplasm of unspecified site of unspecified female breast: Principal | ICD-10-CM

## 2022-01-21 ENCOUNTER — Ambulatory Visit: Admit: 2022-01-21 | Discharge: 2022-01-22 | Payer: MEDICARE

## 2022-01-21 ENCOUNTER — Ambulatory Visit: Admit: 2022-01-21 | Discharge: 2022-01-21 | Payer: MEDICARE

## 2022-01-22 ENCOUNTER — Ambulatory Visit
Admit: 2022-01-22 | Discharge: 2022-01-23 | Payer: MEDICARE | Attending: Hematology & Oncology | Primary: Hematology & Oncology

## 2022-01-22 DIAGNOSIS — C50919 Malignant neoplasm of unspecified site of unspecified female breast: Principal | ICD-10-CM

## 2022-01-22 DIAGNOSIS — R7989 Other specified abnormal findings of blood chemistry: Principal | ICD-10-CM

## 2022-01-22 DIAGNOSIS — Z23 Encounter for immunization: Principal | ICD-10-CM

## 2022-01-22 DIAGNOSIS — C412 Malignant neoplasm of vertebral column: Principal | ICD-10-CM

## 2022-02-19 DIAGNOSIS — C50919 Malignant neoplasm of unspecified site of unspecified female breast: Principal | ICD-10-CM

## 2022-03-03 ENCOUNTER — Ambulatory Visit: Admit: 2022-03-03 | Discharge: 2022-03-04 | Payer: MEDICARE

## 2022-03-03 DIAGNOSIS — C50919 Malignant neoplasm of unspecified site of unspecified female breast: Principal | ICD-10-CM

## 2022-03-27 NOTE — Unmapped (Signed)
Phone Visit  Patient approved appointment change:   Patient demographics and insurance updated:   MSPQ complete:   Instructions provided for phone visit:

## 2022-05-13 DIAGNOSIS — C50919 Malignant neoplasm of unspecified site of unspecified female breast: Principal | ICD-10-CM

## 2022-05-13 MED ORDER — LYNPARZA 100 MG TABLET
ORAL_TABLET | 5 refills | 0 days
Start: 2022-05-13 — End: ?

## 2022-05-14 MED ORDER — LYNPARZA 100 MG TABLET
ORAL_TABLET | 5 refills | 0 days | Status: CP
Start: 2022-05-14 — End: ?

## 2022-05-27 ENCOUNTER — Ambulatory Visit: Admit: 2022-05-27 | Discharge: 2022-05-28 | Payer: MEDICARE

## 2022-05-27 ENCOUNTER — Ambulatory Visit: Admit: 2022-05-27 | Payer: MEDICARE

## 2022-05-27 ENCOUNTER — Ambulatory Visit: Admit: 2022-05-27 | Discharge: 2022-05-27 | Payer: MEDICARE

## 2022-05-28 ENCOUNTER — Ambulatory Visit: Admit: 2022-05-28 | Discharge: 2022-05-29 | Payer: MEDICARE

## 2022-05-28 ENCOUNTER — Ambulatory Visit
Admit: 2022-05-28 | Discharge: 2022-05-29 | Payer: MEDICARE | Attending: Hematology & Oncology | Primary: Hematology & Oncology

## 2022-05-28 DIAGNOSIS — C50919 Malignant neoplasm of unspecified site of unspecified female breast: Principal | ICD-10-CM

## 2022-05-28 DIAGNOSIS — C787 Secondary malignant neoplasm of liver and intrahepatic bile duct: Principal | ICD-10-CM

## 2022-05-28 MED ORDER — RIBOCICLIB 600 MG/DAY (200 MG X 3) TABLETS
ORAL_TABLET | Freq: Every day | ORAL | 5 refills | 0 days | Status: CP
Start: 2022-05-28 — End: ?

## 2022-05-28 MED ORDER — EXEMESTANE 25 MG TABLET
ORAL_TABLET | Freq: Every day | ORAL | 3 refills | 90 days | Status: CP
Start: 2022-05-28 — End: 2023-05-28

## 2022-05-30 DIAGNOSIS — C50919 Malignant neoplasm of unspecified site of unspecified female breast: Principal | ICD-10-CM

## 2022-06-02 DIAGNOSIS — C50919 Malignant neoplasm of unspecified site of unspecified female breast: Principal | ICD-10-CM

## 2022-06-02 DIAGNOSIS — C787 Secondary malignant neoplasm of liver and intrahepatic bile duct: Principal | ICD-10-CM

## 2022-06-03 NOTE — Unmapped (Signed)
[facility] Specialty Medication Onboarding    Specialty Medication: Kisqali 600 mg/day tablets  Prior Authorization: Approved   Financial Assistance: No - copay  <$25  Final Copay/Day Supply: $0 / 28    Insurance Restrictions:     Notes to Pharmacist:     The triage team has completed the benefits investigation and has determined that the patient is able to fill this medication at [facility]. Please contact the patient to complete the onboarding or follow up with the prescribing physician as needed.

## 2022-06-04 DIAGNOSIS — C50919 Malignant neoplasm of unspecified site of unspecified female breast: Principal | ICD-10-CM

## 2022-06-04 MED ORDER — RIBOCICLIB 600 MG/DAY (200 MG X 3) TABLETS
ORAL_TABLET | ORAL | 5 refills | 0 days | Status: CP
Start: 2022-06-04 — End: ?

## 2022-06-04 NOTE — Unmapped (Signed)
PA for ribociclib approved and no copay assistance required.  I spoke to Susan Robbins to determine her preferred specialty pharmacy since she lives in [place].  Prescription for ribociclib sent to [facility] specialty pharmacy.  We hope she will be able to start her first cycle on 06/09/22 and then return to [facility] for labs and ECG on C1D15 of her cycle.  [name] to let me know when she receives the medication.

## 2022-06-05 NOTE — Unmapped (Signed)
Specialty Medication(s): Kisqali    Susan Robbins has been dis-enrolled from the [facility] specialty pharmacy services due to a pharmacy change. The patient is now filling at [facility] .    Additional information provided to the patient: no    Rollen Sox, [facility]  [facility] Specialty Pharmacist

## 2022-06-17 ENCOUNTER — Ambulatory Visit: Admit: 2022-06-17 | Discharge: 2022-06-18 | Payer: MEDICARE

## 2022-06-17 DIAGNOSIS — C50919 Malignant neoplasm of unspecified site of unspecified female breast: Principal | ICD-10-CM

## 2022-06-17 MED ORDER — HYDROCODONE 5 MG-ACETAMINOPHEN 325 MG TABLET
ORAL_TABLET | Freq: Four times a day (QID) | ORAL | 0 refills | 1 days | Status: CP | PRN
Start: 2022-06-17 — End: ?

## 2022-06-17 MED ORDER — RIBOCICLIB 600 MG/DAY (200 MG X 3) TABLETS
ORAL_TABLET | ORAL | 5 refills | 0 days | Status: CP
Start: 2022-06-17 — End: ?

## 2022-06-19 ENCOUNTER — Emergency Department (HOSPITAL_COMMUNITY)
Admission: EM | Admit: 2022-06-19 | Discharge: 2022-06-20 | Disposition: A | Discharge status: Home / Self Care | Payer: Medicare Other | Attending: Emergency Medicine | Admitting: Emergency Medicine

## 2022-06-19 ENCOUNTER — Other Ambulatory Visit: Payer: Self-pay

## 2022-06-19 ENCOUNTER — Emergency Department (HOSPITAL_COMMUNITY): Payer: Medicare Other

## 2022-06-19 DIAGNOSIS — R0602 Shortness of breath: Secondary | ICD-10-CM | POA: Insufficient documentation

## 2022-06-19 DIAGNOSIS — C50919 Malignant neoplasm of unspecified site of unspecified female breast: Secondary | ICD-10-CM | POA: Insufficient documentation

## 2022-06-19 DIAGNOSIS — R030 Elevated blood-pressure reading, without diagnosis of hypertension: Secondary | ICD-10-CM | POA: Diagnosis not present

## 2022-06-19 DIAGNOSIS — E039 Hypothyroidism, unspecified: Secondary | ICD-10-CM | POA: Insufficient documentation

## 2022-06-19 DIAGNOSIS — Z79890 Hormone replacement therapy (postmenopausal): Secondary | ICD-10-CM | POA: Insufficient documentation

## 2022-06-19 DIAGNOSIS — R079 Chest pain, unspecified: Secondary | ICD-10-CM | POA: Diagnosis present

## 2022-06-19 LAB — CBC
HCT: 34.4 % — ABNORMAL LOW (ref 36.0–46.0)
Hemoglobin: 12.3 g/dL (ref 12.0–15.0)
MCH: 33.1 pg (ref 26.0–34.0)
MCHC: 35.8 g/dL (ref 30.0–36.0)
MCV: 92.5 fL (ref 80.0–100.0)
Platelets: 214 10*3/uL (ref 150–400)
RBC: 3.72 MIL/uL — ABNORMAL LOW (ref 3.87–5.11)
RDW: 13.9 % (ref 11.5–15.5)
WBC: 6.2 10*3/uL (ref 4.0–10.5)
nRBC: 0 % (ref 0.0–0.2)

## 2022-06-19 LAB — BASIC METABOLIC PANEL
Anion gap: 9 (ref 5–15)
BUN: 21 mg/dL (ref 8–23)
CO2: 28 mmol/L (ref 22–32)
Calcium: 9.7 mg/dL (ref 8.9–10.3)
Chloride: 100 mmol/L (ref 98–111)
Creatinine, Ser: 1.31 mg/dL — ABNORMAL HIGH (ref 0.44–1.00)
GFR, Estimated: 43 mL/min — ABNORMAL LOW (ref >60)
Glucose, Bld: 115 mg/dL — ABNORMAL HIGH (ref 70–99)
Potassium: 3.4 mmol/L — ABNORMAL LOW (ref 3.5–5.1)
Sodium: 137 mmol/L (ref 135–145)

## 2022-06-19 LAB — PROTIME-INR
INR: 0.9 (ref 0.8–1.2)
Prothrombin Time: 11.9 seconds (ref 11.4–15.2)

## 2022-06-19 LAB — TROPONIN I (HIGH SENSITIVITY): Troponin I (High Sensitivity): 7 ng/L (ref <18)

## 2022-06-19 LAB — BRAIN NATRIURETIC PEPTIDE: B Natriuretic Peptide: 96.7 pg/mL (ref 0.0–100.0)

## 2022-06-19 MED ORDER — IOHEXOL 350 MG/ML SOLN
60.0000 mL | Freq: Once | INTRAVENOUS | Status: AC | PRN
  Administered 2022-06-19: 60 mL via INTRAVENOUS

## 2022-06-19 NOTE — ED Provider Notes (Signed)
[facility] Provider Note   CSN: [id number] Arrival date & time: 06/19/22  2125     History  Chief Complaint  Patient presents with   Chest Pain    ERINA GIORGIANNI is a 72 y.o. female.  The history is provided by the patient and medical records.  Chest Pain Associated symptoms: shortness of breath    72 y.o. F with hx of GERD, metastatic breast cancer currently undergoing oral chemotherapy, hypothyroidism, hx of DVT not currently on anticoagulation, presenting to the ED for chest pain.  States she noticed this about 2 days ago, has been intermittent.  Feels it a lot in her back but also in the central chest.  Pain described as a "tightness".  She reports some occasional SOB but denies cough, fever, or other URI symptoms.  She was recently started on new oral chemotherapy 2 days ago-- was told this could cause AFIB so not sure if that was related or not.  No recent palpitations.  She has no prior cardiac hx.  She is not a smoker.  She does report hx of DVT in left calf about 50 years ago.  She is not currently on anticoagulation. No hx of PE.  Home Medications Prior to Admission medications   Medication Sig Start Date End Date Taking? Authorizing Provider  abemaciclib (VERZENIO) 150 MG tablet Verzenio 150 mg tablet  Take 1 tablet twice a day by oral route. 06/12/20   [provider]  Cyanocobalamin (B-12) 2000 MCG TABS Take 2,000 mcg by mouth daily. 02/01/21   Mansouraty, Telford Nab., MD  diphenoxylate-atropine (LOMOTIL) 2.5-0.025 MG tablet Take 1-2 tablets by mouth daily as needed for diarrhea or loose stools. 01/23/21   Mansouraty, Telford Nab., MD  ergocalciferol (VITAMIN D2) 50000 units capsule Take 50,000 Units by mouth once a week. 07/16/17   [provider]  ferrous gluconate (FERGON) 324 MG tablet TAKE 1 TABLET BY MOUTH DAILY WITH BREAKFAST 02/26/21   Mansouraty, Telford Nab., MD  Fulvestrant (FASLODEX IM) Inject into  the muscle every 28 (twenty-eight) days.    [provider]  hyoscyamine (ANASPAZ) 0.125 MG TBDP disintergrating tablet Place 0.125 mg under the tongue daily as needed for cramping.    [provider]  ibuprofen (ADVIL,MOTRIN) 200 MG tablet Take 200 mg by mouth every 6 (six) hours as needed for mild pain.    [provider]  levothyroxine (SYNTHROID, LEVOTHROID) 75 MCG tablet Take 75 mcg by mouth daily.     [provider]  Multiple Vitamin (MULTIVITAMIN WITH MINERALS) TABS tablet Take 1 tablet by mouth daily. Reported on 09/17/2015    [provider]  pantoprazole (PROTONIX) 40 MG tablet Take 40 mg by mouth daily as needed (For heartburn or acid refux.).    [provider]  Polyvinyl Alcohol-Povidone (REFRESH OP) Place 1 drop into both eyes daily as needed (For dry eyes.).    [provider]  valACYclovir (VALTREX) 1000 MG tablet Take 1 tablet (1,000 mg total) by mouth 2 (two) times daily. 04/03/17   Estill Dooms, MD      Allergies    Heparin, Penicillins, and Oxycodone    Review of Systems   Review of Systems  Respiratory:  Positive for shortness of breath.   Cardiovascular:  Positive for chest pain.  All other systems reviewed and are negative.   Physical Exam Updated Vital Signs BP (!) 175/79   Pulse 74   Temp 98.5 F (36.9  C) (Oral)   Resp 18   SpO2 99%   Physical Exam Vitals and nursing note reviewed.  Constitutional:      Appearance: She is well-developed.  HENT:     Head: Normocephalic and atraumatic.  Eyes:     Conjunctiva/sclera: Conjunctivae normal.     Pupils: Pupils are equal, round, and reactive to light.  Cardiovascular:     Rate and Rhythm: Normal rate and regular rhythm.     Heart sounds: Normal heart sounds.  Pulmonary:     Effort: Pulmonary effort is normal.     Breath sounds: Normal breath sounds.  Abdominal:     General: Bowel sounds are normal.     Palpations: Abdomen is soft.      Comments: Small bruise with dressing in place to RUQ (liver biopsy 2 days ago), abdomen soft, non-tender, no peritoneal signs  Musculoskeletal:        General: Normal range of motion.     Cervical back: Normal range of motion.  Skin:    General: Skin is warm and dry.  Neurological:     Mental Status: She is alert and oriented to person, place, and time.     ED Results / Procedures / Treatments   Labs (all labs ordered are listed, but only abnormal results are displayed) Labs Reviewed  BASIC METABOLIC PANEL - Abnormal; Notable for the following components:      Result Value   Potassium 3.4 (*)    Glucose, Bld 115 (*)    Creatinine, Ser 1.31 (*)    GFR, Estimated 43 (*)    All other components within normal limits  CBC - Abnormal; Notable for the following components:   RBC 3.72 (*)    HCT 34.4 (*)    All other components within normal limits  PROTIME-INR  BRAIN NATRIURETIC PEPTIDE  TROPONIN I (HIGH SENSITIVITY)  TROPONIN I (HIGH SENSITIVITY)    EKG EKG Interpretation  Date/Time:  Thursday June 19 2022 21:34:32 EST Ventricular Rate:  93 PR Interval:  158 QRS Duration: 88 QT Interval:  392 QTC Calculation: 487 R Axis:   85 Text Interpretation: Normal sinus rhythm Right atrial enlargement Nonspecific ST abnormality Abnormal ECG When compared with ECG of 05-Feb-1998 12:50, PREVIOUS ECG IS PRESENT Confirmed by Lennice Sites [phone]) on 06/19/2022 9:41:49 PM  Radiology CT Angio Chest PE W and/or Wo Contrast  Result Date: 06/20/2022 CLINICAL DATA:  Chest pain, history of metastatic breast cancer EXAM: CT ANGIOGRAPHY CHEST WITH CONTRAST TECHNIQUE: Multidetector CT imaging of the chest was performed using the standard protocol during bolus administration of intravenous contrast. Multiplanar CT image reconstructions and MIPs were obtained to evaluate the vascular anatomy. RADIATION DOSE REDUCTION: This exam was performed according to the departmental dose-optimization program which  includes automated exposure control, adjustment of the mA and/or kV according to patient size and/or use of iterative reconstruction technique. CONTRAST:  45m OMNIPAQUE IOHEXOL 350 MG/ML SOLN COMPARISON:  PET-CT dated 12/13/2016. CT abdomen/pelvis dated 11/30/2016. FINDINGS: Cardiovascular: Satisfactory opacification of the bilateral pulmonary arteries to the segmental level. No evidence of pulmonary embolism. Study is not tailored for evaluation of the thoracic aorta. Mild atherosclerotic calcifications of the aortic arch. The heart is top-normal in size.  No pericardial effusion. Mediastinum/Nodes: No suspicious mediastinal lymphadenopathy. Visualized thyroid is unremarkable. Lungs/Pleura: Suspected radiation changes in the anterior left upper lobe. No suspicious pulmonary nodules. Mild atelectasis in the inferior right middle lobe and mild compressive atelectasis in the posterior right lower lobe. Small right pleural  effusion. No pneumothorax. Upper Abdomen: Visualized upper abdomen is notable for multiple hepatic lesions measuring up to 2.6 cm at the junction of segment 8 and 4A (series 6/image 69), new from priors, suspicious for metastatic disease. Musculoskeletal: Status post bilateral mastectomy with reconstruction. Scattered sclerotic osseous metastases throughout the visualized thoracolumbar spine, more conspicuous on CT than on the prior, although note is made of similar lesions on the PET portion of the prior study. Overall, the appearance is still favored to be progressive from 2018. Review of the MIP images confirms the above findings. IMPRESSION: No evidence of pulmonary embolism. Small right pleural effusion. Suspected radiation changes in the anterior left upper lobe. Multiple hepatic lesions measuring up to 2.6 cm, new from priors, suspicious for metastatic disease. Scattered sclerotic osseous metastases throughout the visualized thoracolumbar spine, possibly progressive from 2018. Aortic  Atherosclerosis (ICD10-I70.0). Electronically Signed   By: Julian Hy M.D.   On: 06/20/2022 00:10   DG Chest 2 View  Result Date: 06/19/2022 CLINICAL DATA:  Chest pain EXAM: CHEST - 2 VIEW COMPARISON:  10/03/2015 FINDINGS: Cardiac shadow is within normal limits. Aortic calcifications are noted. Postsurgical changes are noted bilaterally consistent with prior mastectomy and reconstruction. No focal infiltrate is seen. Blunting of the right costophrenic angle is noted consistent with small effusion. This is of uncertain chronicity. IMPRESSION: Small right pleural effusion of uncertain chronicity. No other focal abnormality is noted. Electronically Signed   By: Inez Catalina M.D.   On: 06/19/2022 21:52    Procedures Procedures    Medications Ordered in ED Medications  iohexol (OMNIPAQUE) 350 MG/ML injection 60 mL (60 mLs Intravenous Contrast Given 06/19/22 2348)    ED Course/ Medical Decision Making/ A&P                             Medical Decision Making Amount and/or Complexity of Data Reviewed Labs: ordered. Radiology: ordered and independent interpretation performed. ECG/medicine tests: ordered and independent interpretation performed.  Risk Prescription drug management.   72 year old female presenting to the ED with chest pain.  Has known history of breast cancer with liver mets, recently started on new oral chemotherapy 2 days ago.  She is afebrile, nontoxic in appearance.  Has had some elevated blood pressure readings today as well.  She has no known history of hypertension.  EKG is nonischemic.  Labs and chest x-ray ordered.  She also has history of remote DVT, no longer on anticoagulation.  This coupled with her malignancy, feel she is high risk for PE.  Will also obtain CTA of the chest.  Labs as above--no leukocytosis, no electrolyte derangement.  BNP is within normal limits.  Troponin negative x 2.  Chest x-ray with questionable small right pleural effusion.  CTA was  obtained and is negative for any acute findings, liver mets seen which are known, recently biopsied.  Results discussed with patient and she feels reassured.  Question if this is related to her new medications, she does report history of similar reactions in the past but without elevated blood pressure readings.  Her BP has down trended here in the ED without acute intervention but is still higher than baseline.  I recommended that she speak with her oncologist in the morning about this.  Encouraged to follow-up with her PCP in the interim.  She can return here for any new or acute changes.  Final Clinical Impression(s) / ED Diagnoses Final diagnoses:  Chest pain in adult  Elevated blood pressure reading    Rx / DC Orders ED Discharge Orders     None         Larene Pickett, PA-C 06/20/22 0112    Lennice Sites, DO 06/20/22 2016

## 2022-06-19 NOTE — ED Triage Notes (Signed)
Patient reports central chest pain with mild SOB onset 2 days ago , currently receiving medications for breast cancer , she adds hypertension this evening BP=190/100 . Denies emesis or diaphoresis .

## 2022-06-20 DIAGNOSIS — R079 Chest pain, unspecified: Secondary | ICD-10-CM | POA: Diagnosis not present

## 2022-06-20 LAB — TROPONIN I (HIGH SENSITIVITY): Troponin I (High Sensitivity): 12 ng/L (ref <18)

## 2022-06-20 NOTE — Discharge Instructions (Signed)
I would talk with your oncologist in the morning about your medication.  They should be able to see all your tests from this visit. Can follow-up with your primary care doctor in the interim. Return here for any new/acute changes.

## 2022-06-23 ENCOUNTER — Ambulatory Visit: Admit: 2022-06-23 | Discharge: 2022-06-24 | Payer: MEDICARE | Attending: Surgical | Primary: Surgical

## 2022-06-23 DIAGNOSIS — C50919 Malignant neoplasm of unspecified site of unspecified female breast: Principal | ICD-10-CM

## 2022-06-23 DIAGNOSIS — N1330 Unspecified hydronephrosis: Principal | ICD-10-CM

## 2022-06-23 DIAGNOSIS — R799 Abnormal finding of blood chemistry, unspecified: Principal | ICD-10-CM

## 2022-06-23 DIAGNOSIS — C801 Malignant (primary) neoplasm, unspecified: Principal | ICD-10-CM

## 2022-06-23 DIAGNOSIS — F419 Anxiety disorder, unspecified: Principal | ICD-10-CM

## 2022-06-24 DIAGNOSIS — C50919 Malignant neoplasm of unspecified site of unspecified female breast: Principal | ICD-10-CM

## 2022-06-24 MED ORDER — CAPIVASERTIB 200 MG TABLET
ORAL_TABLET | Freq: Two times a day (BID) | ORAL | 3 refills | 0 days | Status: CP
Start: 2022-06-24 — End: ?

## 2022-07-07 NOTE — Unmapped (Signed)
[facility] Specialty Medication Onboarding    Specialty Medication: Truqap  Prior Authorization: Approved   Financial Assistance: No - copay  <$25  Final Copay/Day Supply: $0 / 28    Insurance Restrictions: None     Notes to Pharmacist:     The triage team has completed the benefits investigation and has determined that the patient is able to fill this medication at [facility]. Please contact the patient to complete the onboarding or follow up with the prescribing physician as needed.

## 2022-07-08 DIAGNOSIS — C50919 Malignant neoplasm of unspecified site of unspecified female breast: Principal | ICD-10-CM

## 2022-07-08 MED ORDER — CAPIVASERTIB 200 MG TABLET
ORAL_TABLET | Freq: Two times a day (BID) | ORAL | 3 refills | 0 days | Status: CP
Start: 2022-07-08 — End: ?

## 2022-07-15 NOTE — Unmapped (Signed)
[facility]  contacted the Communication Center requesting to speak with the care team of [facility] to discuss:    -[facility] stated she is calling to inform care team that they do not carry   capivasertib 200 mg Tab     Please contact [facility]  at [phone].        Thank you,   [facility]  [facility]   [phone]

## 2022-07-16 DIAGNOSIS — C50919 Malignant neoplasm of unspecified site of unspecified female breast: Principal | ICD-10-CM

## 2022-07-16 MED ORDER — CAPIVASERTIB 200 MG TABLET
ORAL_TABLET | Freq: Two times a day (BID) | ORAL | 3 refills | 0 days | Status: CP
Start: 2022-07-16 — End: ?

## 2022-07-18 DIAGNOSIS — C50919 Malignant neoplasm of unspecified site of unspecified female breast: Principal | ICD-10-CM

## 2022-07-18 MED ORDER — CAPIVASERTIB 200 MG TABLET
ORAL_TABLET | Freq: Two times a day (BID) | ORAL | 3 refills | 0 days | Status: CP
Start: 2022-07-18 — End: ?
  Filled 2022-07-24: qty 64, 28d supply, fill #0

## 2022-07-21 NOTE — Unmapped (Signed)
[facility]   Patient Onboarding/Medication Counseling    Susan Robbins is a 72 y.o. female with breast cancer who I am counseling today on initiation of therapy.  I am speaking to the patient.    Was a Nurse, learning disability used for this call? No    Verified patient's date of birth / HIPAA.    Specialty medication(s) to be sent: Hematology/Oncology: Truqap      Non-specialty medications/supplies to be sent: n/a      Medications not needed at this time: n/a         Truqap (capivasertib)    Medication & Administration     Dosage: Take 2 tablets (400 mg) by mouth twice daily for 4 consecutive days followed by 3 days off     Administration:     Administer orally approximately every 12 hours.   Take with or without food.  Administer on days 1 to 4 of each week (not to be taken every day).  Used in combination with fulvestrant.  For use in HR-positive, HER-2 negative, PIK3CA, AKT1, and/or PTEN-altered breast cancer following progression on at least one endocrine-based regimen in the metastatic setting or recurrence on or with 12 months of completing adjuvant therapy.  Swallow tablets whole; do not chew, crush or split. Do not ingest tablets that are broken, cracked, or appear damaged.      Adherence/Missed dose instructions:   If a dose is missed and realized with 4 hours of the scheduled time, take the missed dose.  If you miss a dose more than 4 hours from the scheduled time, skip that dose and take the next dose at its normal scheduled time.  If vomiting occurs, skip the dose and administer the next dose at its scheduled time.  Do not double the dose.      Lab Requirement: Confirm HR-positive, HER-2 negative, PIK3CA, AKT1 and/or PTEN-altered (detected by an approved test) status prior to treatment initiation: HR-positive, HER-2 negative, PIK3CA, AKT1 and/or PTEN-altered status confirmed and documented in chart    Goals of Therapy     To prevent disease progression    Side Effects & Monitoring Parameters Commonly reported side effects  Fatigue  Nausea, vomiting.  Diarrhea  Decreased appetite.  Mouth sores  Rash  Hyperglycemia    The following side effects should be reported to the provider:  Signs of an allergic reaction like rash: hives: itching; red, swollen, blistered, or peeling skin with or without fever; wheezing; tightness in the chest or throat; trouble breathing, swallowing, or talking; unusual hoarseness; or swelling of the mouth, face, lips, tongue or throat.  Signs of high blood sugar like confusion, feeling sleepy, unusual thirst or hunger, passing urine more often, flushing, fast breathing, or breath that smells like fruit.  Signs of kidney problems like unable to pass urine, change in how much urine is passed, blood in the urine, or a big weight gain.  Signs of a urinary tract infection like blood in the urine, burning or pain when passing urine, feeling the need to pass urine often or right away, fever, lower stomach pain or pelvic pain.  Signs of electrolyte problems like mood changes; confusion; muscle pain, cramps, or spasms; weakness; shakiness; change in balance; an abnormal heartbeat; seizures; loss of appetite; or severe upset stomach or throwing up.  Fever, chills, fast heartbeat, fast breathing, confusion, or cold clammy skin.  Chest pain or pressure  Severe skin reactions, redness or irritation of the palms of hands or soles of  feet       Monitoring Parameters:   ER-positive, HER-2 negative with the presence of  PIK3CA, AKT1, and/or PTEN genetic alterations in tumor tissue  Assess hepatic function (bilirubin, ALT and AST) prior to treatment.  Hyperglycemia: Evaluated fasting blood glucose and AC1 prior to initiation and periodically during therapy.  At least every 2 weeks during the first month and then at least once a month starting from the second month. Patients with a medical history of diabetes and in patients with risk factors for hyperglycemia such as obesity (BMI> 30), elevated FBD of > 160 should be monitored more frequently.  If a patient experiences hyperglycemia after initiating therapy, monitor FBG as clinically indicated and at least twice weekly until FBG decreases to normal levels; during antihyperglycemic medication therapy continue monitoring FBG at least once a week for 8 weeks, followed by once every 2 weeks and as clinically indicated.  Verify pregnancy status in females of reproductive potential prior to treatment initiation.  Monitor for signs/symptoms of cutaneous adverse reactions and diarrhea.  Monitor adherence.  American Society of Clinical Oncology recommends hepatitis B virus. Detection of chronic or past hepatitis B infection requires a risk assessment to determine antiviral prophylaxis requirements, monitoring, and follow-up      Contraindications, Warnings, & Precautions     Severe hypersensitivity to capivasertib or any component of the formulation  Dermatologic toxicity: cutaneous adverse reactions which may be severe occurred.  Reactions have included erythema multiforme, palmar-plantar erythrodysesthesia and drug reaction with eosinophilia and systemic symptoms (DRESS) have been reported.  The median time to onset of cutaneous adverse reactions was 13 days and may result in dose modifications.  GI toxicity: severe diarrhea occurred in nearly three-fourth of patients including Grade 3 or 4. The median time to occurrence was 8 days.  Hyperglycemia: safety has not been established in patients with type 1 diabetes or diabetes requiring insulin. The median time to occur was 15 days.  Nearly half of patients who experienced hyperglycemia required hyperglycemic medications (insulin, metformin) and a majority of these patients remained on medications after capivasertib was discontinued.  Older adults had a higher incidence of grade 3 to 5 adverse reactions and dosage modifications were in required in patients 39 or older.  Reproductive Considerations: Verify pregnancy status in females of reproductive potential prior to initiating therapy. Exposure of an unborn child could cause birth defects, so you should not become pregnant or father a child while on this medication. Effective birth control in women is necessary during treatment and for at least 1 month after treatment, even if your menstrual cycle stops.  For men, effective birth control is necessary during treatment and for 4 months after your last treatment, even if you believe you are not producing sperm.  Breastfeeding Considerations:  It is not known if capivasertib is present in breast milk.  Due to the potential for serious adverse reactions in the breastfed infant, breastfeeding is not recommended by the manufacturer.    Drug/Food Interactions     Medication list reviewed in Epic. The patient was instructed to inform the care team before taking any new medications or supplements. No drug interactions identified.   Avoid grapefruit and grapefruit juice during therapy      Storage, Handling Precautions, & Disposal     Store at room temperature in the original container (do not use a pillbox or store with other medications).   Store in a dry place.  Do not store in a bathroom.  Caregivers helping administer medication should wear gloves and wash hands immediately after.    Keep the lid tightly closed. Keep out of the reach of children and pets.  Do not flush down a toilet or pour down a drain unless instructed to do so.  Check with your oncology office, local police department or fire station about drug take-back programs in your area.            Current Medications (including OTC/herbals), Comorbidities and Allergies     Current Outpatient Medications   Medication Sig Dispense Refill    atenolol (TENORMIN) 25 MG tablet Take 1 tablet (25 mg total) by mouth daily.      calcium carbonate (TUMS) 200 mg calcium (500 mg) chewable tablet Chew 2.5 tablets (500 mg of elem calcium total)  in the morning.      capivasertib 200 mg Tab Take 2 tablets (400 mg) by mouth two (2) times a day on days 1-4 out of every 7 days. 64 tablet 3    chlorhexidine (PERIDEX) 0.12 % solution  (Patient not taking: Reported on 06/23/2022)      cholecalciferol, vitamin D3, 2,000 unit cap Vitamin D3 2,000 unit capsule   Take by oral route.      clobetasol (TEMOVATE) 0.05 % external solution clobetasol 0.05 % scalp solution   1 APPLICATION APPLY ON THE SKIN AT BEDTIME APPLY AT BEDTIME TO SPOT ON SCALP FOR 2 WEEKS      cyanocobalamin, vitamin B-12, 1,000 mcg/mL injection 1 mL (1,000 mcg total)  in the morning.      diphenoxylate-atropine (LOMOTIL) 2.5-0.025 mg per tablet TAKE 1-2 TABS BY MOUTH DAILY AS NEEDED FOR DIARRHEA OR LOOSE STOOLS      docusate sodium (COLACE) 100 MG capsule Take 1 capsule (100 mg total) by mouth two (2) times a day as needed. (Patient not taking: Reported on 06/23/2022)      exemestane (AROMASIN) 25 mg tablet Take 1 tablet (25 mg total) by mouth daily. 90 tablet 3    fluorouraciL (EFUDEX) 5 % cream daily as needed.      fluticasone propionate (FLONASE) 50 mcg/actuation nasal spray 50 sprays.      HYDROcodone-acetaminophen (NORCO) 5-325 mg per tablet Take 1 tablet by mouth every six (6) hours as needed for pain for up to 3 doses. (Patient not taking: Reported on 06/23/2022) 3 tablet 0    hydrocortisone 1 % cream Apply topically daily as needed.      hyoscyamine sulfate (LEVSIN) 0.125 mg tablet Place 1 tablet (0.125 mg total) under the tongue daily as needed.      ibuprofen (ADVIL,MOTRIN) 200 MG tablet Take 1 tablet (200 mg total) by mouth every six (6) hours as needed.      Lactobacillus acidophilus (PROBIOTIC ORAL) Take by mouth. Takes it on/off      levothyroxine (SYNTHROID, LEVOTHROID) 75 MCG tablet Take 1 tablet (75 mcg total) by mouth before bedtime.      loperamide (IMODIUM A-D) 2 mg tablet Takes 2 tabs with first loose stool and then 1 tablet with each subsequent loose stool 30 tablet 5    ondansetron (ZOFRAN) 8 MG tablet Take 1 tablet (8 mg total) by mouth every eight (8) hours as needed for nausea. 30 tablet 2    pantoprazole (PROTONIX) 40 MG tablet Take 1 tablet (40 mg total) by mouth daily as needed.      senna-docusate (PERICOLACE) 8.6-50 mg Take 1 tablet by mouth daily.      syringe with  needle (BD LUER-LOK SYRINGE) 3 mL 25 gauge x 1 Syrg BD Luer-Lok Syringe 3 mL 25 gauge x 1      triamcinolone (KENALOG) 0.1 % lotion Apply topically Three (3) times a day. (Patient not taking: Reported on 06/23/2022)      valACYclovir (VALTREX) 500 MG tablet two (2) times a day as needed.       No current facility-administered medications for this visit.     Facility-Administered Medications Ordered in Other Visits   Medication Dose Route Frequency Provider Last Rate Last Admin    denosumab (XGEVA) 120 mg/1.7 mL (70 mg/mL) injection             denosumab (XGEVA) 120 mg/1.7 mL (70 mg/mL) injection             denosumab (XGEVA) 120 mg/1.7 mL (70 mg/mL) injection                Allergies   Allergen Reactions    Berry Flavor Nausea And Vomiting     All berries    Heparin Hives     Reaction from 30 years ago had to put her on Coumadin instead.  Hives in the past but has had heparin recently with  No problems  Reaction from 30 years ago had to put her on Coumadin instead.    Heparin Analogues Hives     Hives in the past but has had heparin recently with  No problems    Penicillins Other (See Comments)     Reaction unknown occurred while in college  Has patient had a PCN reaction causing immediate rash, facial/tongue/throat swelling, SOB or lightheadedness with hypotension: reaction unknown  Has patient had a PCN reaction causing severe rash involving mucus membranes or skin necrosis: reaction unknown  Has patient had a PCN reaction that required hospitalization no  Has patient had a PCN reaction occurring within the last 10 years: no  If all of the above answers are NO, then may proceed with Cep  Reaction unknown occurred while in college  Has patient had a PCN reaction causing immediate rash, facial/tongue/throat swelling, SOB or lightheadedness with hypotension: reaction unknown  Has patient had a PCN reaction causing severe rash involving mucus membranes or skin necrosis: reaction unknown  Has patient had a PCN reaction that required hospitalization no  Has patient had a PCN reaction occurring within the last 10 years: no  If all of the above answers are NO, then may proceed with Cep    Oxycodone Nausea Only       Patient Active Problem List   Diagnosis    Primary malignant neoplasm of breast with metastasis (CMS-HCC)    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    Appropriateness of Therapy     Acute infections noted within Epic:  No active infections  Patient reported infection: None    Is medication and dose appropriate based on diagnosis and infection status? Yes    Prescription has been clinically reviewed: Yes      Baseline Quality of Life Assessment      How many days over the past month did your breast cancer  keep you from your normal activities? For example, brushing your teeth or getting up in the morning. 0    Financial Information     Medication Assistance provided: None Required  Anticipated copay of $0 reviewed with patient. Verified delivery address.    Delivery Information     Scheduled delivery date: 4/12    Expected start date: 4/15    Patient was notified of new phone menu: Yes    Medication will be delivered via UPS to the temporary address in Epic [place].  This shipment will not require a signature.      Explained the services we provide at [facility] and that each month we would call to set up refills.  Stressed importance of returning phone calls so that we could ensure they receive their medications in time each month.  Informed patient that we should be setting up refills 7-10 days prior to when they will run out of medication.  A pharmacist will reach out to perform a clinical assessment periodically.  Informed patient that a welcome packet, containing information about our pharmacy and other support services, a Notice of Privacy Practices, and a drug information handout will be sent.      The patient or caregiver noted above participated in the development of this care plan and knows that they can request review of or adjustments to the care plan at any time.      Patient or caregiver verbalized understanding of the above information as well as how to contact the pharmacy at [phone] option 4 with any questions/concerns.  The pharmacy is open Monday through Friday 8:30am-4:30pm.  A pharmacist is available 24/7 via pager to answer any clinical questions they may have.    Patient Specific Needs     Does the patient have any physical, cognitive, or cultural barriers? No    Does the patient have adequate living arrangements? (i.e. the ability to store and take their medication appropriately) Yes    Did you identify any home environmental safety or security hazards? No    Patient prefers to have medications discussed with  Patient     Is the patient or caregiver able to read and understand education materials at a high school level or above? Yes    Patient's primary language is  English     Is the patient high risk? Yes, patient is taking oral chemotherapy. Appropriateness of therapy as been assessed    SOCIAL DETERMINANTS OF HEALTH     At the [facility], we have learned that life circumstances - like trouble affording food, housing, utilities, or transportation can affect the health of many of our patients.   That is why we wanted to ask: are you currently experiencing any life circumstances that are negatively impacting your health and/or quality of life? Patient declined to answer    Social Determinants of Health     Financial Resource Strain: Not on file   Internet Connectivity: Not on file Food Insecurity: Not on file   Tobacco Use: Low Risk  (06/23/2022)    Patient History     Smoking Tobacco Use: Never     Smokeless Tobacco Use: Never     Passive Exposure: Not on file   Housing/Utilities: Not on file   Alcohol Use: Not on file   Transportation Needs: Not on file   Substance Use: Not on file   Health Literacy: Not on file   Physical Activity: Not on file   Interpersonal Safety: Not on file   Stress: Not on file   Intimate Partner Violence: Not on file   Depression: Not at risk (06/06/2022)    Received from [facility]  PHQ-2     Patient Health Questionnaire-2 Score: 0   Social Connections: Not on file       Would you be willing to receive help with any of the needs that you have identified today? Not applicable       Clydell Hakim, PharmD  [facility] Specialty Pharmacist

## 2022-07-21 NOTE — Unmapped (Signed)
Pharmacist Phone Follow-Up    Cancer Team  Medical Oncology: Dr. Iona Hansen  Reason for call: Medication management  Current treatment: Exemestane + capivasertib (not yet started)     Assessment/Plan  Breast cancer:  Susan Robbins is a 72 y.o. female with HR+/HER2- metastatic breast cancer who was on exemestane.  She started ribociclib and developed severe back pain and hypertension, requiring a ED visit.  Ribociclib was discontinued and the plan is for her to continue exemestane and start capivasertib.    She reports that she again developed high blood pressure in addition to cognitive changes (dizziness and memory loss such as forgetting words and dates).  She had a follow-up appointment with her PCP who told her to stop exemestane and ordered brain MRI.  Brain MRI was negative.      She has now been off of exemestane for approximately 2 weeks.  She reports that she feels better.  Blood pressure is still in the 170s/80s.  She still feels that her mental abilities are not at her baseline but they have improved.  Her PCP has prescribed atenolol 25 mg daily which she will start soon.    Per conversation with Dr. Iona Hansen, cognitive changes are not likely due to exemestane.  Susan Robbins will re-challenge with exemestane.    Susan Robbins is in agreement with this plan.  She plans to start her anti-hypertensive first to ensure that she tolerates the medication.  She will then re-start exemestane.  If she does well, she will then be able to initiate capivasertib.    ________________________________________________________________________     Oral chemotherapy:  Capivasertib 400 mg po BID x 4 days on and 3 days off   Start date: Pending  Pharmacy: [facility]     Patient verbalized understanding of the above information.     I spent 15 minutes on the phone with the patient on the date of service. I spent an additional 15 minutes on pre- and post-visit activities.     The patient was physically located in [place] or a state in which I am permitted to provide care. The patient and/or parent/guardian understood that s/he may incur co-pays and cost sharing, and agreed to the telemedicine visit. The visit was reasonable and appropriate under the circumstances given the patient's presentation at the time.    The patient and/or parent/guardian has been advised of the potential risks and limitations of this mode of treatment (including, but not limited to, the absence of in-person examination) and has agreed to be treated using telemedicine. The patient's/patient's family's questions regarding telemedicine have been answered.     If the visit was completed in an ambulatory setting, the patient and/or parent/guardian has also been advised to contact their provider???s office for worsening conditions, and seek emergency medical treatment and/or call 911 if the patient deems either necessary.

## 2022-07-21 NOTE — Unmapped (Signed)
[facility] Specialty Medication Onboarding    Specialty Medication: Truqap  Prior Authorization: Not Required   Financial Assistance: No - copay  <$25  Final Copay/Day Supply: $0 / 28    Insurance Restrictions: None     Notes to Pharmacist:     The triage team has completed the benefits investigation and has determined that the patient is able to fill this medication at [facility]. Please contact the patient to complete the onboarding or follow up with the prescribing physician as needed.

## 2022-07-25 MED ORDER — ONDANSETRON HCL 8 MG TABLET
ORAL_TABLET | Freq: Three times a day (TID) | ORAL | 2 refills | 10 days | Status: CP | PRN
Start: 2022-07-25 — End: ?

## 2022-07-25 NOTE — Unmapped (Signed)
Pharmacist Phone Follow-Up    Cancer Team  Medical Oncology: Dr. Iona Hansen  Reason for call: Medication management  Current treatment: Exemestane + capivasertib (not yet started)     Assessment/Plan  Breast cancer:  Susan Robbins is a 72 y.o. female with HR+/HER2- metastatic breast cancer who was on exemestane.  She started ribociclib and developed severe back pain and hypertension, requiring a ED visit.  Ribociclib was discontinued and the plan is for her to continue exemestane and start capivasertib.    She developed high blood pressure in addition to cognitive changes (dizziness and memory loss such as forgetting words and dates) and held exemestane for about 2 weeks.    She has now started atenolol and subsequently re-started exemestane about 5 days ago.  She notes that she does not feel well and feels like she has the flu.  She has muscle aches, fatigue and nausea.  She denies fever.  I recommended that she watch for a fever and consider flu/Covid tests to rule this out.  Prescription for ondansetron for nausea sent to her home pharmacy.  She has a very dry mouth and I recommended that she start Biotene.     She is scheduled to receive capivasertib next week.  CPP to call her prior to initiation of the medication to assess how she is doing.    ________________________________________________________________________     Oral chemotherapy:  Capivasertib 400 mg po BID x 4 days on and 3 days off   Start date: Pending  Pharmacy: [facility]     Patient verbalized understanding of the above information.   Approximate time on phone with patient: 10 minutes

## 2022-07-28 NOTE — Unmapped (Signed)
Received a message from Dr Gwenyth Ober administratinve assistant that Susan Robbins was admitted to the [facility] last night.  Her husband is requesting a call.    I spoke with Susan Robbins by telephone. He states that Susan Robbins was taken to the ED for sudden onset back pain. She has been admitted for UT and "fluid in her lungs and in her abdomen"  His question is whether he should allow the hospital to drain the fluid. We discussed that since Susan Robbins is symptomatic with shortness of breath it would be beneficial to her. We also discussed that the fluid should also be sent to cytology so that we know whether it is malignant or a side effect of her known metastatic breast cancer.     Per note from Dr Iona Hansen, she will call Susan Robbins back later today, and is available for a call from the admitting team in [facility] as a consult.    Susan Robbins was grateful for the information.

## 2022-07-29 NOTE — Unmapped (Signed)
Spoke to PA Katie at [facility] given patient's hospitalization.     She noted they did drain the fluid from the patient's pleural effusion and ascites and sent it for cytology. The patient is having some nausea that is improving with Ativan PRN.     The patient will likely be admitted for a few more days, but Florentina Addison will keep [place] posted. We will work on coordinate a visit post discharge, either telemedicine if possible given she will be in [facility] or in person if she is able to make it back to [place].

## 2022-08-04 NOTE — Unmapped (Signed)
Hi,     Scott, the patients son contacted the Communication Center requesting to speak with the care team of ADELLYN LONSKI to discuss:    Patients son scott was reaching out regarding his mothers treatment plan, was calling to see if he flew his mother in to [place], if she can be admitted to the hospital.       Please contact Scott at [phone]      Thank you,   Roseanne Kaufman  [facility]   [phone]

## 2022-08-05 NOTE — Unmapped (Signed)
LEFT VM for son Lorin Picket as he requested a call

## 2022-08-05 NOTE — Unmapped (Signed)
Spoke to patient's son Lorin Picket.     He is informed me that patient's cognition has changed in the last week or so, particularly when she came off steroids. She underwent a MRI brain that showed new lesions and fluid on the brain compared to the one from 3/22. MRI brain showed "leptomeningeal enhancement is present is within the cerebellar folia, also likely present along the right frontotemporal convexity and the right greater than left occipital lobes. 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  CSF sampling is recommended.  Is degree of leptomeningeal enhancement and appearance is greater than expected for normal vasculature.  Acute hydrocephalus with periventricular interstitial edema superimposed on a background of parenchymal volume loss and chronic microvascular ischemic changes "    Lorin Picket notes given this finding they are working on transporting her to [place] for hospice.     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 I asked Lorin Picket to keep [place] informed if anything changes and I will let Dr. Iona Hansen know about the above when she comes back.

## 2022-08-26 NOTE — Unmapped (Signed)
Pt's husband, [name], asked that the pharmacies be contacted re: pt's passing so that prescriptions will no longer be refilled.  Spoke with [place] at [facility] [place] at [facility], [place] at [facility], and [place] at [facility] to inform them of pt's passing and to discontinue refills.

## 2022-08-26 NOTE — Unmapped (Signed)
Specialty Medication(s): Truqap    Ms.Pendell has been dis-enrolled from the [facility] specialty pharmacy services due to patient is deceased.    Additional information provided to the patient: Received a call from Delaine Lame, nurse navigator, informing [facility] that patient passed.    Kermit Balo, [facility]  [facility] Shared [facility] Specialty Pharmacist

## 2022-09-13 DEATH — deceased
